# Patient Record
Sex: Male | Born: 1935 | Race: White | Hispanic: No | Smoking: Former smoker
Health system: Southern US, Community
[De-identification: ages and names within clinical notes are randomized; demographics above are authoritative.]

## PROBLEM LIST (undated history)

## (undated) DIAGNOSIS — IMO0002 Reserved for concepts with insufficient information to code with codable children: Secondary | ICD-10-CM

## (undated) DIAGNOSIS — M47817 Spondylosis without myelopathy or radiculopathy, lumbosacral region: Secondary | ICD-10-CM

## (undated) DIAGNOSIS — M542 Cervicalgia: Secondary | ICD-10-CM

## (undated) DIAGNOSIS — I2510 Atherosclerotic heart disease of native coronary artery without angina pectoris: Secondary | ICD-10-CM

## (undated) DIAGNOSIS — G4700 Insomnia, unspecified: Secondary | ICD-10-CM

## (undated) DIAGNOSIS — Z7729 Contact with and (suspected ) exposure to other hazardous substances: Secondary | ICD-10-CM

## (undated) DIAGNOSIS — R0902 Hypoxemia: Secondary | ICD-10-CM

## (undated) DIAGNOSIS — E785 Hyperlipidemia, unspecified: Secondary | ICD-10-CM

## (undated) DIAGNOSIS — M1710 Unilateral primary osteoarthritis, unspecified knee: Secondary | ICD-10-CM

## (undated) DIAGNOSIS — G459 Transient cerebral ischemic attack, unspecified: Secondary | ICD-10-CM

## (undated) DIAGNOSIS — R9430 Abnormal result of cardiovascular function study, unspecified: Secondary | ICD-10-CM

## (undated) DIAGNOSIS — I214 Non-ST elevation (NSTEMI) myocardial infarction: Secondary | ICD-10-CM

## (undated) DIAGNOSIS — K5790 Diverticulosis of intestine, part unspecified, without perforation or abscess without bleeding: Secondary | ICD-10-CM

## (undated) DIAGNOSIS — M545 Low back pain, unspecified: Secondary | ICD-10-CM

## (undated) DIAGNOSIS — I10 Essential (primary) hypertension: Secondary | ICD-10-CM

## (undated) DIAGNOSIS — F411 Generalized anxiety disorder: Secondary | ICD-10-CM

## (undated) DIAGNOSIS — D62 Acute posthemorrhagic anemia: Secondary | ICD-10-CM

## (undated) DIAGNOSIS — M4307 Spondylolysis, lumbosacral region: Secondary | ICD-10-CM

## (undated) DIAGNOSIS — A6000 Herpesviral infection of urogenital system, unspecified: Secondary | ICD-10-CM

## (undated) DIAGNOSIS — C61 Malignant neoplasm of prostate: Principal | ICD-10-CM

## (undated) DIAGNOSIS — K219 Gastro-esophageal reflux disease without esophagitis: Secondary | ICD-10-CM

## (undated) DIAGNOSIS — M1610 Unilateral primary osteoarthritis, unspecified hip: Secondary | ICD-10-CM

## (undated) DIAGNOSIS — J189 Pneumonia, unspecified organism: Secondary | ICD-10-CM

## (undated) DIAGNOSIS — E871 Hypo-osmolality and hyponatremia: Secondary | ICD-10-CM

## (undated) DIAGNOSIS — K5792 Diverticulitis of intestine, part unspecified, without perforation or abscess without bleeding: Secondary | ICD-10-CM

## (undated) DIAGNOSIS — M1990 Unspecified osteoarthritis, unspecified site: Secondary | ICD-10-CM

## (undated) HISTORY — DX: Cervicalgia: M54.2

## (undated) HISTORY — DX: Diverticulosis of intestine, part unspecified, without perforation or abscess without bleeding: K57.90

## (undated) HISTORY — DX: Insomnia, unspecified: G47.00

## (undated) HISTORY — DX: Reserved for concepts with insufficient information to code with codable children: IMO0002

## (undated) HISTORY — DX: Hyperlipidemia, unspecified: E78.5

## (undated) HISTORY — DX: Abnormal result of cardiovascular function study, unspecified: R94.30

## (undated) HISTORY — DX: Herpesviral infection of urogenital system, unspecified: A60.00

## (undated) HISTORY — DX: Contact with and (suspected) exposure to other hazardous substances: Z77.29

## (undated) HISTORY — DX: Acute posthemorrhagic anemia: D62

## (undated) HISTORY — DX: Pneumonia, unspecified organism: J18.9

## (undated) HISTORY — DX: Essential (primary) hypertension: I10

## (undated) HISTORY — DX: Transient cerebral ischemic attack, unspecified: G45.9

## (undated) HISTORY — DX: Spondylolysis, lumbosacral region: M43.07

## (undated) HISTORY — DX: Hypo-osmolality and hyponatremia: E87.1

## (undated) HISTORY — DX: Diverticulitis of intestine, part unspecified, without perforation or abscess without bleeding: K57.92

## (undated) HISTORY — DX: Generalized anxiety disorder: F41.1

## (undated) HISTORY — DX: Unspecified osteoarthritis, unspecified site: M19.90

## (undated) HISTORY — DX: Malignant neoplasm of prostate: C61

## (undated) HISTORY — PX: TONSILLECTOMY: SUR1361

## (undated) HISTORY — DX: Low back pain: M54.5

## (undated) HISTORY — DX: Unilateral primary osteoarthritis, unspecified knee: M17.10

## (undated) HISTORY — DX: Atherosclerotic heart disease of native coronary artery without angina pectoris: I25.10

## (undated) HISTORY — DX: Non-ST elevation (NSTEMI) myocardial infarction: I21.4

## (undated) HISTORY — PX: MASTOIDECTOMY: SHX711

## (undated) HISTORY — DX: Gastro-esophageal reflux disease without esophagitis: K21.9

## (undated) HISTORY — DX: Hypoxemia: R09.02

## (undated) HISTORY — PX: CATARACT EXTRACTION: SUR2

## (undated) HISTORY — DX: Low back pain, unspecified: M54.50

## (undated) HISTORY — DX: Spondylosis without myelopathy or radiculopathy, lumbosacral region: M47.817

## (undated) HISTORY — DX: Unilateral primary osteoarthritis, unspecified hip: M16.10

---

## 2001-11-03 ENCOUNTER — Encounter (INDEPENDENT_AMBULATORY_CARE_PROVIDER_SITE_OTHER): Payer: Self-pay | Admitting: *Deleted

## 2001-11-03 ENCOUNTER — Ambulatory Visit (HOSPITAL_COMMUNITY): Admission: RE | Admit: 2001-11-03 | Discharge: 2001-11-03 | Payer: Self-pay | Admitting: *Deleted

## 2001-12-28 ENCOUNTER — Encounter: Payer: Self-pay | Admitting: Internal Medicine

## 2001-12-28 ENCOUNTER — Encounter: Admission: RE | Admit: 2001-12-28 | Discharge: 2001-12-28 | Payer: Self-pay | Admitting: Internal Medicine

## 2003-09-02 ENCOUNTER — Encounter: Admission: RE | Admit: 2003-09-02 | Discharge: 2003-09-02 | Payer: Self-pay | Admitting: Infectious Diseases

## 2004-01-28 ENCOUNTER — Emergency Department (HOSPITAL_COMMUNITY): Admission: EM | Admit: 2004-01-28 | Discharge: 2004-01-28 | Payer: Self-pay

## 2004-07-27 ENCOUNTER — Encounter: Admission: RE | Admit: 2004-07-27 | Discharge: 2004-07-27 | Payer: Self-pay | Admitting: Specialist

## 2004-10-28 ENCOUNTER — Encounter
Admission: RE | Admit: 2004-10-28 | Discharge: 2004-10-28 | Payer: Self-pay | Admitting: Physical Medicine and Rehabilitation

## 2005-10-26 ENCOUNTER — Emergency Department (HOSPITAL_COMMUNITY): Admission: EM | Admit: 2005-10-26 | Discharge: 2005-10-26 | Payer: Self-pay | Admitting: Emergency Medicine

## 2005-11-04 ENCOUNTER — Encounter: Admission: RE | Admit: 2005-11-04 | Discharge: 2005-11-04 | Payer: Self-pay | Admitting: Internal Medicine

## 2005-12-05 ENCOUNTER — Encounter: Admission: RE | Admit: 2005-12-05 | Discharge: 2005-12-05 | Payer: Self-pay | Admitting: Internal Medicine

## 2006-11-20 ENCOUNTER — Emergency Department (HOSPITAL_COMMUNITY): Admission: EM | Admit: 2006-11-20 | Discharge: 2006-11-20 | Payer: Self-pay | Admitting: Emergency Medicine

## 2009-01-25 ENCOUNTER — Encounter: Admission: RE | Admit: 2009-01-25 | Discharge: 2009-01-25 | Payer: Self-pay | Admitting: Neurological Surgery

## 2010-02-07 ENCOUNTER — Emergency Department (HOSPITAL_COMMUNITY)
Admission: EM | Admit: 2010-02-07 | Discharge: 2010-02-07 | Payer: Self-pay | Source: Home / Self Care | Admitting: Emergency Medicine

## 2010-08-26 DIAGNOSIS — C61 Malignant neoplasm of prostate: Secondary | ICD-10-CM

## 2010-08-26 HISTORY — DX: Malignant neoplasm of prostate: C61

## 2010-09-14 ENCOUNTER — Ambulatory Visit (HOSPITAL_COMMUNITY)
Admission: RE | Admit: 2010-09-14 | Discharge: 2010-09-14 | Payer: Self-pay | Source: Home / Self Care | Attending: Urology | Admitting: Urology

## 2010-09-24 ENCOUNTER — Ambulatory Visit (HOSPITAL_COMMUNITY)
Admission: RE | Admit: 2010-09-24 | Discharge: 2010-09-24 | Payer: Self-pay | Source: Home / Self Care | Attending: Urology | Admitting: Urology

## 2010-09-24 ENCOUNTER — Other Ambulatory Visit: Payer: Self-pay | Admitting: Urology

## 2010-09-24 DIAGNOSIS — C61 Malignant neoplasm of prostate: Secondary | ICD-10-CM

## 2010-09-24 LAB — BUN: BUN: 9 mg/dL (ref 6–23)

## 2010-09-24 LAB — CREATININE, SERUM
GFR calc Af Amer: >60 mL/min (ref >60)
GFR calc non Af Amer: >60 mL/min (ref >60)

## 2010-10-04 ENCOUNTER — Ambulatory Visit (HOSPITAL_COMMUNITY)
Admission: RE | Admit: 2010-10-04 | Discharge: 2010-10-04 | Disposition: A | Discharge status: Home / Self Care | Payer: Medicare Other | Source: Ambulatory Visit | Attending: Urology | Admitting: Urology

## 2010-10-04 ENCOUNTER — Other Ambulatory Visit (HOSPITAL_COMMUNITY): Payer: Self-pay

## 2010-10-04 DIAGNOSIS — M47817 Spondylosis without myelopathy or radiculopathy, lumbosacral region: Secondary | ICD-10-CM | POA: Insufficient documentation

## 2010-10-04 DIAGNOSIS — R928 Other abnormal and inconclusive findings on diagnostic imaging of breast: Secondary | ICD-10-CM | POA: Insufficient documentation

## 2010-10-04 DIAGNOSIS — N400 Enlarged prostate without lower urinary tract symptoms: Secondary | ICD-10-CM | POA: Insufficient documentation

## 2010-10-04 DIAGNOSIS — N323 Diverticulum of bladder: Secondary | ICD-10-CM | POA: Insufficient documentation

## 2010-10-04 DIAGNOSIS — C61 Malignant neoplasm of prostate: Secondary | ICD-10-CM

## 2010-10-04 DIAGNOSIS — C7951 Secondary malignant neoplasm of bone: Secondary | ICD-10-CM | POA: Insufficient documentation

## 2010-10-04 DIAGNOSIS — N62 Hypertrophy of breast: Secondary | ICD-10-CM | POA: Insufficient documentation

## 2010-10-04 MED ORDER — IOHEXOL 300 MG/ML  SOLN: 100.0000 mL | Freq: Once | INTRAMUSCULAR | Status: AC | PRN

## 2010-10-16 ENCOUNTER — Ambulatory Visit (INDEPENDENT_AMBULATORY_CARE_PROVIDER_SITE_OTHER): Payer: Medicare Other | Admitting: Urology

## 2010-10-16 DIAGNOSIS — C61 Malignant neoplasm of prostate: Secondary | ICD-10-CM

## 2010-10-16 DIAGNOSIS — C7952 Secondary malignant neoplasm of bone marrow: Secondary | ICD-10-CM

## 2010-11-23 ENCOUNTER — Emergency Department (HOSPITAL_COMMUNITY): Payer: Medicare Other

## 2010-11-23 ENCOUNTER — Emergency Department (HOSPITAL_COMMUNITY)
Admission: EM | Admit: 2010-11-23 | Discharge: 2010-11-23 | Disposition: A | Discharge status: Home / Self Care | Payer: Medicare Other | Attending: Emergency Medicine | Admitting: Emergency Medicine

## 2010-11-23 DIAGNOSIS — I10 Essential (primary) hypertension: Secondary | ICD-10-CM | POA: Insufficient documentation

## 2010-11-23 DIAGNOSIS — R42 Dizziness and giddiness: Secondary | ICD-10-CM | POA: Insufficient documentation

## 2010-11-23 DIAGNOSIS — Z79899 Other long term (current) drug therapy: Secondary | ICD-10-CM | POA: Insufficient documentation

## 2010-11-23 LAB — BASIC METABOLIC PANEL
BUN: 22 mg/dL (ref 6–23)
CO2: 32 mEq/L (ref 19–32)
Chloride: 101 mEq/L (ref 96–112)
Creatinine, Ser: 1.13 mg/dL (ref 0.4–1.5)
Glucose, Bld: 98 mg/dL (ref 70–99)
Potassium: 4.7 mEq/L (ref 3.5–5.1)

## 2010-11-23 LAB — DIFFERENTIAL
Basophils Relative: 1 % (ref 0–1)
Eosinophils Absolute: 0.3 10*3/uL (ref 0.0–0.7)
Eosinophils Relative: 6 % — ABNORMAL HIGH (ref 0–5)
Lymphs Abs: 1.8 10*3/uL (ref 0.7–4.0)
Monocytes Relative: 11 % (ref 3–12)
Neutrophils Relative %: 47 % (ref 43–77)

## 2010-11-23 LAB — URINALYSIS, ROUTINE W REFLEX MICROSCOPIC
Bilirubin Urine: NEGATIVE
Glucose, UA: NEGATIVE mg/dL
Hgb urine dipstick: NEGATIVE
Ketones, ur: NEGATIVE mg/dL
Protein, ur: NEGATIVE mg/dL
pH: 7 (ref 5.0–8.0)

## 2010-11-23 LAB — CBC
MCH: 36.1 pg — ABNORMAL HIGH (ref 26.0–34.0)
MCV: 99.1 fL (ref 78.0–100.0)
Platelets: 159 10*3/uL (ref 150–400)
RBC: 4.32 MIL/uL (ref 4.22–5.81)
RDW: 12.1 % (ref 11.5–15.5)
WBC: 4.9 10*3/uL (ref 4.0–10.5)

## 2010-11-23 LAB — POCT CARDIAC MARKERS

## 2010-11-24 LAB — URINE CULTURE
Colony Count: NO GROWTH
Culture  Setup Time: 201203301041

## 2010-12-23 ENCOUNTER — Emergency Department (HOSPITAL_COMMUNITY)
Admission: EM | Admit: 2010-12-23 | Discharge: 2010-12-23 | Disposition: A | Discharge status: Home / Self Care | Payer: Medicare Other | Attending: Emergency Medicine | Admitting: Emergency Medicine

## 2010-12-23 DIAGNOSIS — I10 Essential (primary) hypertension: Secondary | ICD-10-CM | POA: Insufficient documentation

## 2010-12-23 DIAGNOSIS — K5732 Diverticulitis of large intestine without perforation or abscess without bleeding: Secondary | ICD-10-CM | POA: Insufficient documentation

## 2010-12-23 DIAGNOSIS — R1032 Left lower quadrant pain: Secondary | ICD-10-CM | POA: Insufficient documentation

## 2010-12-23 LAB — COMPREHENSIVE METABOLIC PANEL
ALT: 17 U/L (ref 0–53)
AST: 25 U/L (ref 0–37)
Alkaline Phosphatase: 52 U/L (ref 39–117)
CO2: 27 mEq/L (ref 19–32)
Calcium: 9.4 mg/dL (ref 8.4–10.5)
Chloride: 101 mEq/L (ref 96–112)
GFR calc non Af Amer: >60 mL/min (ref >60)
Glucose, Bld: 99 mg/dL (ref 70–99)
Sodium: 135 mEq/L (ref 135–145)
Total Bilirubin: 0.8 mg/dL (ref 0.3–1.2)

## 2010-12-23 LAB — URINALYSIS, ROUTINE W REFLEX MICROSCOPIC
Bilirubin Urine: NEGATIVE
Glucose, UA: NEGATIVE mg/dL
Hgb urine dipstick: NEGATIVE
Specific Gravity, Urine: 1.02 (ref 1.005–1.030)
Urobilinogen, UA: 0.2 mg/dL (ref 0.0–1.0)

## 2010-12-23 LAB — DIFFERENTIAL
Eosinophils Relative: 2 % (ref 0–5)
Lymphocytes Relative: 17 % (ref 12–46)
Lymphs Abs: 1.6 10*3/uL (ref 0.7–4.0)
Monocytes Absolute: 1.2 10*3/uL — ABNORMAL HIGH (ref 0.1–1.0)

## 2010-12-23 LAB — CBC
HCT: 41.5 % (ref 39.0–52.0)
Hemoglobin: 15.1 g/dL (ref 13.0–17.0)
MCHC: 36.4 g/dL — ABNORMAL HIGH (ref 30.0–36.0)
MCV: 98.3 fL (ref 78.0–100.0)
RDW: 12.4 % (ref 11.5–15.5)
WBC: 9.7 10*3/uL (ref 4.0–10.5)

## 2011-07-26 ENCOUNTER — Other Ambulatory Visit: Payer: Self-pay | Admitting: Urology

## 2011-07-26 ENCOUNTER — Ambulatory Visit (HOSPITAL_COMMUNITY)
Admission: RE | Admit: 2011-07-26 | Discharge: 2011-07-26 | Disposition: A | Discharge status: Home / Self Care | Payer: Medicare Other | Source: Ambulatory Visit | Attending: Urology | Admitting: Urology

## 2011-07-26 DIAGNOSIS — M545 Low back pain, unspecified: Secondary | ICD-10-CM | POA: Insufficient documentation

## 2011-07-26 DIAGNOSIS — C7952 Secondary malignant neoplasm of bone marrow: Secondary | ICD-10-CM | POA: Insufficient documentation

## 2011-07-26 DIAGNOSIS — J90 Pleural effusion, not elsewhere classified: Secondary | ICD-10-CM | POA: Insufficient documentation

## 2011-07-26 DIAGNOSIS — G9520 Unspecified cord compression: Secondary | ICD-10-CM

## 2011-07-26 DIAGNOSIS — C7951 Secondary malignant neoplasm of bone: Secondary | ICD-10-CM | POA: Insufficient documentation

## 2011-07-26 DIAGNOSIS — C61 Malignant neoplasm of prostate: Secondary | ICD-10-CM | POA: Insufficient documentation

## 2011-07-26 DIAGNOSIS — M4804 Spinal stenosis, thoracic region: Secondary | ICD-10-CM | POA: Insufficient documentation

## 2011-07-26 DIAGNOSIS — R109 Unspecified abdominal pain: Secondary | ICD-10-CM | POA: Insufficient documentation

## 2011-07-26 MED ORDER — GADOBENATE DIMEGLUMINE 529 MG/ML IV SOLN
15.0000 mL | Freq: Once | INTRAVENOUS | Status: AC | PRN
  Administered 2011-07-26: 15 mL via INTRAVENOUS

## 2011-07-29 LAB — POCT I-STAT, CHEM 8
BUN: 18 mg/dL (ref 6–23)
Calcium, Ion: 1.16 mmol/L (ref 1.12–1.32)
Creatinine, Ser: 1.1 mg/dL (ref 0.50–1.35)
Glucose, Bld: 96 mg/dL (ref 70–99)
Hemoglobin: 14.6 g/dL (ref 13.0–17.0)
TCO2: 30 mmol/L (ref 0–100)

## 2011-08-10 ENCOUNTER — Telehealth: Payer: Self-pay | Admitting: *Deleted

## 2011-08-10 NOTE — Telephone Encounter (Signed)
left voice message to inform the patient of the new date and time on 10-23-2011 

## 2011-08-12 ENCOUNTER — Telehealth: Payer: Self-pay | Admitting: *Deleted

## 2011-08-12 NOTE — Telephone Encounter (Signed)
Patient called in and confirmed appointment for 08-29-2011 starting at 1:30pm with the fcounseling and lab and md

## 2011-08-13 ENCOUNTER — Telehealth: Payer: Self-pay | Admitting: Oncology

## 2011-08-13 NOTE — Telephone Encounter (Signed)
Referred by Dr. Retta Diones Dx-Met Prostate

## 2011-08-16 ENCOUNTER — Other Ambulatory Visit: Payer: Self-pay | Admitting: Oncology

## 2011-08-16 DIAGNOSIS — C61 Malignant neoplasm of prostate: Secondary | ICD-10-CM

## 2011-08-23 ENCOUNTER — Encounter: Payer: Self-pay | Admitting: *Deleted

## 2011-08-29 ENCOUNTER — Ambulatory Visit (HOSPITAL_BASED_OUTPATIENT_CLINIC_OR_DEPARTMENT_OTHER): Payer: Medicare Other | Admitting: Oncology

## 2011-08-29 ENCOUNTER — Other Ambulatory Visit (HOSPITAL_BASED_OUTPATIENT_CLINIC_OR_DEPARTMENT_OTHER): Payer: Medicare Other | Admitting: Lab

## 2011-08-29 ENCOUNTER — Ambulatory Visit: Payer: Medicare Other

## 2011-08-29 VITALS — BP 128/70 | HR 64 | Temp 98.2°F | Ht 68.0 in | Wt 165.5 lb

## 2011-08-29 DIAGNOSIS — C61 Malignant neoplasm of prostate: Secondary | ICD-10-CM

## 2011-08-29 DIAGNOSIS — M899 Disorder of bone, unspecified: Secondary | ICD-10-CM

## 2011-08-29 DIAGNOSIS — C7951 Secondary malignant neoplasm of bone: Secondary | ICD-10-CM

## 2011-08-29 LAB — COMPREHENSIVE METABOLIC PANEL
AST: 21 U/L (ref 0–37)
Albumin: 4.3 g/dL (ref 3.5–5.2)
Alkaline Phosphatase: 68 U/L (ref 39–117)
BUN: 16 mg/dL (ref 6–23)
Glucose, Bld: 94 mg/dL (ref 70–99)
Potassium: 4.2 mEq/L (ref 3.5–5.3)
Sodium: 139 mEq/L (ref 135–145)
Total Bilirubin: 0.5 mg/dL (ref 0.3–1.2)
Total Protein: 6.3 g/dL (ref 6.0–8.3)

## 2011-08-29 LAB — TESTOSTERONE: Testosterone: 31.11 ng/dL — ABNORMAL LOW (ref 250–890)

## 2011-08-29 LAB — CBC WITH DIFFERENTIAL/PLATELET
EOS%: 2.3 % (ref 0.0–7.0)
Eosinophils Absolute: 0.2 10*3/uL (ref 0.0–0.5)
LYMPH%: 24.9 % (ref 14.0–49.0)
MCH: 34.3 pg — ABNORMAL HIGH (ref 27.2–33.4)
MCV: 99.8 fL — ABNORMAL HIGH (ref 79.3–98.0)
MONO%: 9.2 % (ref 0.0–14.0)
Platelets: 284 10*3/uL (ref 140–400)
RBC: 4.03 10*6/uL — ABNORMAL LOW (ref 4.20–5.82)
RDW: 13.6 % (ref 11.0–14.6)

## 2011-08-29 NOTE — Progress Notes (Signed)
Note Dictated

## 2011-08-29 NOTE — Progress Notes (Signed)
CC:   Craig [name], M.D. Craig [name], M.D.  REASON FOR CONSULTATION:  Prostate cancer.  HISTORY OF PRESENT ILLNESS:  Craig [name] presents today for a followup visit.  He is a pleasant 78 year old gentleman, native of [place], lived in [place] for an extended period of time during his profession in the [employer].  He also was in the [employer] during the later part of the [place] War.  This is a gentleman with a past medical history significant for osteoarthritis, hyperlipidemia and was diagnosed with prostate cancer dating back to January 2012.  He presented with an elevated, asymptomatic PSA of 5.27, and underwent an ultrasound biopsy on September 03, 2010.  12 out of 12 biopsies involved prostate cancer with Gleason score predominantly 4 + 5 = 9.  He had a clinical staging of T2c at that time.  He subsequently had a staging workup and showed positive for bony metastasis in his ribs, the 7th or 8th rib.  He did not have any evidence of any adenopathies at that time.  The patient is under the care of Dr. Retta Diones, treated with Deborra Medina on 10/22/2010 and subsequently Lupron on 11/27/2010 and has been on it since that time. He was also started on Xgeva in June 2012.  He had an excellent response.  The PSA had dropped from 5.4 to a nadir of 0.3 and thinks his most recent PSA was around 0.4.  The patient has also had a rather significant history of back pain, again diagnosed with osteoarthritis in the past, and had been seen by Dr. 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  The patient, again, was referred to me for evaluation regarding his prostate cancer.  Clinically, Craig [name] feels relatively fair.  He, again as mentioned, does have intermittent back pain, but again, this really  seems to be in quality no different than his osteoarthritic pain.  He does not report any neurological symptoms, does not report any weakness of his lower extremities.  He does not really report any other deterioration or increase in the quality of his pain.  He takes Aleve which seems to be managing his pain quite well at this time.  He is able to perform most activities of daily living without any hindrance or decline.  He is able to drive, is able to walk around and perform, as mentioned, most of his house related duties.  REVIEW OF SYSTEMS:  He does not report any headaches, blurry vision, double vision.  Does not report any motor or sensory neuropathy.  Does not report any alteration in mental status.  Does not report any psychiatric issues, depression.  Does not report any fever, chills, sweats.  Does not report any cough, hemoptysis, hematemesis.  No nausea or vomiting.  No abdominal pain.  No hematochezia, no melena.  No genitourinary complaints.  No frequency, urgency or hesitancy.  No musculoskeletal complaints.  No arthralgias or myalgias.  No heat or cold intolerance.  No bleeding or clotting diathesis.  The rest of the review of systems is unremarkable.  PAST MEDICAL HISTORY:  Significant for arthritis, history of hyperlipidemia, hypertension, herpes simplex.  MEDICATIONS:  He is on acyclovir, amlodipine, aspirin, calcium, glucosamine, hydrochlorothiazide, ibuprofen, lisinopril, he is on Lupron, nifedipine, and as mentioned, Xgeva.  ALLERGIES:  To penicillin.  FAMILY HISTORY:  Really unremarkable for any prostate cancer.  There is a remote history of kidney cancer and nephrolithiasis.  SOCIAL HISTORY:  He is divorced.  He has 2 children.  He smoked about 2 packs per day but currently not actively smoking.  Denied any alcohol abuse.  He does drink occasionally, however.  PHYSICAL EXAMINATION:  General:  Alert, awake gentleman, appeared in no active distress today.   Vitals:  Showed a blood pressure of 128/70, pulse 64, respirations 20, temperature is 98.2.  HEENT:  Head is normocephalic, atraumatic.  Pupils equal, round and reactive to light. Oral mucosa moist and pink.  Neck:  Supple, no lymphadenopathy.  Heart: Regular rate and rhythm, S1 and S2.  Lungs:  Clear to auscultation without rhonchi, wheezes or dullness to percussion.  Abdomen:  Soft, nontender, no hepatosplenomegaly.  Extremities:  No clubbing, cyanosis, or edema.  Neurologic:  Intact motor, sensory and deep tendon reflexes.  LABORATORY DATA:  Showed a hemoglobin of 13.8, white cell count 7.9, platelet count of 284.  Testosterone and PSA level are currently pending.  ASSESSMENT AND PLAN: 1. This is an 78 year old gentleman with prostate cancer.  His initial     diagnosis in January 2012 revealed a prostate cancer, Gleason score     4 + 5 = 9, PSA 5.47, clinical staging T2c.  He also had presented     with advanced disease with bony metastasis.  I had a lengthy     discussion with Craig [name] discussing the natural course of     prostate cancer and especially advanced prostate cancer that is     hormone sensitive.  I feel for the time being he is really on     appropriate treatment with hormonal deprivation as well as bony     protective agent such as Xgeva.  However, I feel that we want to     make sure that his testosterone is adequately castrate; for that I     am checking a testosterone level at this time.  I also feel that he     is really at a high risk of developing castration-resistant     disease, if he is not all the way on the way there already.  Given     his high Gleason score, he certainly would be a candidate for     developing castration-resistant disease, which would really put him     at a different category as well as him possibly needing different     treatment agents.  That would be in the form of second-line     hormonal manipulation by glutamate or ketoconazole,  immunotherapy     such as Provenge, or systemic chemotherapy such as Taxotere,     Jevtana, or even clinical trials.  At this point, I feel that Mr.     [name] does not need those at this time but certainly in the near     future. 2. Pain.  His, again, bone pain is predominantly arthritic in nature     from what I can tell, but certainly he would be at consideration     for developing bony pain, and I have talked to him about the role     of radiation therapy should he develop worsening pain at any time. All his questions were answered today.  Followup will be in about 3 months' time, sooner if felt needed.    ______________________________ Benjiman Core, M.D. FNS/MEDQ  D:  08/29/2011  T:  08/29/2011  Job:  [id number]

## 2011-11-27 ENCOUNTER — Telehealth: Payer: Self-pay | Admitting: Oncology

## 2011-11-27 ENCOUNTER — Other Ambulatory Visit (HOSPITAL_BASED_OUTPATIENT_CLINIC_OR_DEPARTMENT_OTHER): Payer: Medicare Other

## 2011-11-27 ENCOUNTER — Ambulatory Visit (HOSPITAL_BASED_OUTPATIENT_CLINIC_OR_DEPARTMENT_OTHER): Payer: Medicare Other | Admitting: Oncology

## 2011-11-27 VITALS — BP 143/72 | HR 58 | Temp 97.3°F | Ht 69.0 in | Wt 166.8 lb

## 2011-11-27 DIAGNOSIS — M899 Disorder of bone, unspecified: Secondary | ICD-10-CM

## 2011-11-27 DIAGNOSIS — Z79818 Long term (current) use of other agents affecting estrogen receptors and estrogen levels: Secondary | ICD-10-CM

## 2011-11-27 DIAGNOSIS — M949 Disorder of cartilage, unspecified: Secondary | ICD-10-CM

## 2011-11-27 DIAGNOSIS — C61 Malignant neoplasm of prostate: Secondary | ICD-10-CM

## 2011-11-27 LAB — PSA: PSA: 0.32 ng/mL (ref <=4.00)

## 2011-11-27 LAB — CBC WITH DIFFERENTIAL/PLATELET
Basophils Absolute: 0 10*3/uL (ref 0.0–0.1)
HCT: 41 % (ref 38.4–49.9)
HGB: 14.6 g/dL (ref 13.0–17.1)
LYMPH%: 28.1 % (ref 14.0–49.0)
MONO#: 0.6 10*3/uL (ref 0.1–0.9)
NEUT%: 59.7 % (ref 39.0–75.0)
Platelets: 205 10*3/uL (ref 140–400)
WBC: 7.6 10*3/uL (ref 4.0–10.3)
lymph#: 2.1 10*3/uL (ref 0.9–3.3)

## 2011-11-27 LAB — COMPREHENSIVE METABOLIC PANEL
AST: 25 U/L (ref 0–37)
Albumin: 4.6 g/dL (ref 3.5–5.2)
BUN: 22 mg/dL (ref 6–23)
CO2: 26 mEq/L (ref 19–32)
Calcium: 9.7 mg/dL (ref 8.4–10.5)
Chloride: 102 mEq/L (ref 96–112)
Creatinine, Ser: 1.01 mg/dL (ref 0.50–1.35)
Glucose, Bld: 90 mg/dL (ref 70–99)
Potassium: 3.9 mEq/L (ref 3.5–5.3)

## 2011-11-27 LAB — TESTOSTERONE: Testosterone: 27.3 ng/dL — ABNORMAL LOW (ref 300–890)

## 2011-11-27 NOTE — Telephone Encounter (Signed)
lmonvm for pt re appt for 6/18 and mailed schedule.

## 2011-11-27 NOTE — Progress Notes (Signed)
Hematology and Oncology Follow Up Visit  Craig [name] [id number] 18-Jan-1926 78 y.o. 11/27/2011 10:43 AM   Principle Diagnosis: 78 year old gentleman with prostate cancer diagnosed in January of 2012. It a Gleason score 4+5 = 9.  Clinical stage TIIC. He has advanced disease to the bone   Current therapy: He has been on [place] since  10/22/2010 and subsequently Lupron on 11/27/2010 and has been on it since that time. He started [place] in June of 2012  Interim History:  Craig [name] presents today for a followup visit. He is a 78 year old gentleman who I saw back in January 2013 for advanced prostate cancer and he is currently treated with hormonal therapy as well as bone directed therapy. His most recent PSA in January 2013 was at 0.4 with a testosterone level above 31. Since the last time I saw him Craig [name] is asymptomatic he does report some diffuse arthritic pain which has not really changed. He had not had any changes in his performance status her activity level he had not had any changes in his quality of life. Is not reporting any constitutional symptoms overall he is doing well. He does report knee pain and as mentioned most likely arthritic in nature.  Medications: I have reviewed the patient's current medications. Current outpatient prescriptions:acyclovir (ZOVIRAX) 200 MG capsule, , Disp: , Rfl: ;  amLODipine (NORVASC) 5 MG tablet, , Disp: , Rfl: ;  aspirin 81 MG tablet, Take 81 mg by mouth daily. Take 1/2 tablet daily , Disp: , Rfl: ;  fenoprofen (NALFON) 600 MG TABS, Take 600 mg by mouth daily. With Vitamin D , Disp: , Rfl: ;  Glucosamine-Chondroit-Vit C-Mn (GLUCOSAMINE CHONDR 1500 COMPLX PO), Take 1,500 mg by mouth daily.  , Disp: , Rfl:  hydrochlorothiazide (HYDRODIURIL) 50 MG tablet, , Disp: , Rfl: ;  lisinopril (PRINIVIL,ZESTRIL) 40 MG tablet, , Disp: , Rfl: ;  Red Yeast Rice 600 MG CAPS, Take by mouth. 2 tablets morning and 2 night , Disp: , Rfl:   Allergies:  Allergies  Allergen  Reactions  . Penicillins     Past Medical History, Surgical history, Social history, and Family History were reviewed and updated.  Review of Systems: Constitutional:  Negative for fever, chills, night sweats, anorexia, weight loss, pain. Cardiovascular: no chest pain or dyspnea on exertion Respiratory: no cough, shortness of breath, or wheezing Neurological: no TIA or stroke symptoms Dermatological: negative ENT: negative Skin: Negative. Gastrointestinal: negative Genito-Urinary: negative Hematological and Lymphatic: negative Breast: negative Musculoskeletal: negative Remaining ROS negative. Physical Exam: Blood pressure 143/72, pulse 58, temperature 97.3 F (36.3 C), temperature source Oral, height 5\' 9"  (1.753 m), weight 166 lb 12.8 oz (75.66 kg). ECOG: 1 General appearance: alert Head: Normocephalic, without obvious abnormality, atraumatic Neck: no adenopathy, no carotid bruit, no JVD, supple, symmetrical, trachea midline and thyroid not enlarged, symmetric, no tenderness/mass/nodules Lymph nodes: Cervical, supraclavicular, and axillary nodes normal. Heart:regular rate and rhythm, S1, S2 normal, no murmur, click, rub or gallop Lung:chest clear, no wheezing, rales, normal symmetric air entry, Heart exam - S1, S2 normal, no murmur, no gallop, rate regular Abdomin: soft, non-tender, without masses or organomegaly EXT:no erythema, induration, or nodules   Lab Results: Lab Results  Component Value Date   WBC 7.6 11/27/2011   HGB 14.6 11/27/2011   HCT 41.0 11/27/2011   MCV 95.1 11/27/2011   PLT 205 11/27/2011     Chemistry      Component Value Date/Time   NA 139 08/29/2011 1339   K  4.2 08/29/2011 1339   CL 99 08/29/2011 1339   CO2 31 08/29/2011 1339   BUN 16 08/29/2011 1339   CREATININE 0.93 08/29/2011 1339      Component Value Date/Time   CALCIUM 9.6 08/29/2011 1339   ALKPHOS 68 08/29/2011 1339   AST 21 08/29/2011 1339   ALT 16 08/29/2011 1339   BILITOT 0.5 08/29/2011 1339        Impression and Plan:  78 year old gentleman with the following issues:  1. Advanced prostate cancer continue to be hormone sensitive at this time with an excellent PSA response to Lupron. Most recent PSA is down to 0.4 with nearly castrate testosterone level. The time being I recommend keeping him on the same medication. I would likely recommend satellite hormonal manipulation as his PSA start to go up again. I will see him in followup in about 2-3 months  2. Bony disease: He is currently on Xgeva. Doing well with that at the time being given at Craig [name]  3. Bony pain most likely arthritic in nature continue to be stable at this time.  [facility], MD 4/3/201310:43 AM

## 2012-02-11 ENCOUNTER — Other Ambulatory Visit (HOSPITAL_BASED_OUTPATIENT_CLINIC_OR_DEPARTMENT_OTHER): Payer: Medicare Other | Admitting: Lab

## 2012-02-11 ENCOUNTER — Telehealth: Payer: Self-pay | Admitting: Oncology

## 2012-02-11 ENCOUNTER — Ambulatory Visit (HOSPITAL_BASED_OUTPATIENT_CLINIC_OR_DEPARTMENT_OTHER): Payer: Medicare Other | Admitting: Oncology

## 2012-02-11 VITALS — BP 147/84 | HR 63 | Temp 97.3°F | Ht 69.0 in | Wt 165.7 lb

## 2012-02-11 DIAGNOSIS — C61 Malignant neoplasm of prostate: Secondary | ICD-10-CM

## 2012-02-11 DIAGNOSIS — M949 Disorder of cartilage, unspecified: Secondary | ICD-10-CM

## 2012-02-11 LAB — CBC WITH DIFFERENTIAL/PLATELET
BASO%: 0.6 % (ref 0.0–2.0)
LYMPH%: 34.5 % (ref 14.0–49.0)
MCHC: 34 g/dL (ref 32.0–36.0)
MONO#: 0.6 10*3/uL (ref 0.1–0.9)
Platelets: 236 10*3/uL (ref 140–400)
RBC: 4.03 10*6/uL — ABNORMAL LOW (ref 4.20–5.82)
RDW: 13.5 % (ref 11.0–14.6)
WBC: 6.7 10*3/uL (ref 4.0–10.3)

## 2012-02-11 LAB — COMPREHENSIVE METABOLIC PANEL
ALT: 15 U/L (ref 0–53)
Albumin: 4 g/dL (ref 3.5–5.2)
Alkaline Phosphatase: 43 U/L (ref 39–117)
CO2: 31 mEq/L (ref 19–32)
Potassium: 3.4 mEq/L — ABNORMAL LOW (ref 3.5–5.3)
Sodium: 142 mEq/L (ref 135–145)
Total Bilirubin: 0.5 mg/dL (ref 0.3–1.2)
Total Protein: 6.5 g/dL (ref 6.0–8.3)

## 2012-02-11 LAB — PSA: PSA: 0.33 ng/mL (ref <=4.00)

## 2012-02-11 NOTE — Telephone Encounter (Signed)
appts made and printed for pt aom °

## 2012-02-11 NOTE — Progress Notes (Signed)
Hematology and Oncology Follow Up Visit  Craig [name] [id number] 02-Apr-1926 78 y.o. 02/11/2012 3:41 PM   Principle Diagnosis: 78 year old gentleman with prostate cancer diagnosed in January of 2012. It a Gleason score 4+5 = 9.  Clinical stage TIIC. He has advanced disease to the bone   Current therapy: He has been on [place] since  10/22/2010 and subsequently Lupron on 11/27/2010 and has been on it since that time. He started [place] in June of 2012  Interim History:  Craig [name] presents today for a followup visit. He is a 78 year old gentleman who I saw back in January 2013 for advanced prostate cancer and he is currently treated with hormonal therapy as well as bone directed therapy. His most recent PSA in April 2013 was at 0.32. Since the last time I saw him Craig [name] is asymptomatic he does report some diffuse arthritic pain which has not really changed. He had not had any changes in his performance status her activity level he had not had any changes in his quality of life. Is not reporting any constitutional symptoms overall he is doing well. He does report knee pain and as mentioned most likely arthritic in nature.  Medications: I have reviewed the patient's current medications. Current outpatient prescriptions:acyclovir (ZOVIRAX) 200 MG capsule, , Disp: , Rfl: ;  amLODipine (NORVASC) 5 MG tablet, Take 2.5 mg by mouth daily. , Disp: , Rfl: ;  aspirin 81 MG tablet, Take 81 mg by mouth daily. Take 1/2 tablet daily , Disp: , Rfl: ;  fenoprofen (NALFON) 600 MG TABS, Take 600 mg by mouth daily. With Vitamin D , Disp: , Rfl: ;  fish oil-omega-3 fatty acids 1000 MG capsule, Take 1 g by mouth daily., Disp: , Rfl:  hydrochlorothiazide (HYDRODIURIL) 50 MG tablet, , Disp: , Rfl: ;  lisinopril (PRINIVIL,ZESTRIL) 40 MG tablet, , Disp: , Rfl:   Allergies:  Allergies  Allergen Reactions  . Penicillins     Past Medical History, Surgical history, Social history, and Family History were reviewed and  updated.  Review of Systems: Constitutional:  Negative for fever, chills, night sweats, anorexia, weight loss, pain. Cardiovascular: no chest pain or dyspnea on exertion Respiratory: no cough, shortness of breath, or wheezing Neurological: no TIA or stroke symptoms Dermatological: negative ENT: negative Skin: Negative. Gastrointestinal: negative Genito-Urinary: negative Hematological and Lymphatic: negative Breast: negative Musculoskeletal: negative Remaining ROS negative. Physical Exam: Blood pressure 147/84, pulse 63, temperature 97.3 F (36.3 C), temperature source Oral, height 5\' 9"  (1.753 m), weight 165 lb 11.2 oz (75.161 kg). ECOG: 1 General appearance: alert Head: Normocephalic, without obvious abnormality, atraumatic Neck: no adenopathy, no carotid bruit, no JVD, supple, symmetrical, trachea midline and thyroid not enlarged, symmetric, no tenderness/mass/nodules Lymph nodes: Cervical, supraclavicular, and axillary nodes normal. Heart:regular rate and rhythm, S1, S2 normal, no murmur, click, rub or gallop Lung:chest clear, no wheezing, rales, normal symmetric air entry, Heart exam - S1, S2 normal, no murmur, no gallop, rate regular Abdomin: soft, non-tender, without masses or organomegaly EXT:no erythema, induration, or nodules   Lab Results: Lab Results  Component Value Date   WBC 6.7 02/11/2012   HGB 14.1 02/11/2012   HCT 41.4 02/11/2012   MCV 102.7* 02/11/2012   PLT 236 02/11/2012     Chemistry      Component Value Date/Time   NA 140 11/27/2011 0958   K 3.9 11/27/2011 0958   CL 102 11/27/2011 0958   CO2 26 11/27/2011 0958   BUN 22 11/27/2011 0958  CREATININE 1.01 11/27/2011 0958      Component Value Date/Time   CALCIUM 9.7 11/27/2011 0958   ALKPHOS 44 11/27/2011 0958   AST 25 11/27/2011 0958   ALT 18 11/27/2011 0958   BILITOT 0.5 11/27/2011 0958     Results for Craig [name], Craig [name] (MRN [id number]) as of 02/11/2012 15:42  Ref. Range 08/29/2011 13:39 11/27/2011 09:58  PSA Latest  Range: <=4.00 ng/mL 0.40 0.32    Impression and Plan:  78 year old gentleman with the following issues:  1. Advanced prostate cancer continue to be hormone sensitive at this time with an excellent PSA response to Lupron. Most recent PSA is down to 0.32 with nearly castrate testosterone level. The time being I recommend keeping him on the same medication. I would likely recommend second line hormonal manipulation if his PSA start to go up again. I will see him in follow every about 2 months  2. Bony disease: He is currently on Xgeva. Doing well with that at the time being given at Dr. Retta Diones  3. Bony pain most likely arthritic in nature continue to be stable at this time.  [facility], MD 6/18/20133:41 PM

## 2012-04-14 ENCOUNTER — Encounter: Payer: Self-pay | Admitting: Oncology

## 2012-04-14 ENCOUNTER — Telehealth: Payer: Self-pay | Admitting: Oncology

## 2012-04-14 ENCOUNTER — Ambulatory Visit (HOSPITAL_BASED_OUTPATIENT_CLINIC_OR_DEPARTMENT_OTHER): Payer: Medicare Other | Admitting: Oncology

## 2012-04-14 ENCOUNTER — Other Ambulatory Visit (HOSPITAL_BASED_OUTPATIENT_CLINIC_OR_DEPARTMENT_OTHER): Payer: Medicare Other | Admitting: Lab

## 2012-04-14 VITALS — BP 162/83 | HR 58 | Temp 97.8°F | Resp 20 | Ht 69.0 in | Wt 166.4 lb

## 2012-04-14 DIAGNOSIS — C7951 Secondary malignant neoplasm of bone: Secondary | ICD-10-CM

## 2012-04-14 DIAGNOSIS — C61 Malignant neoplasm of prostate: Secondary | ICD-10-CM

## 2012-04-14 LAB — CBC WITH DIFFERENTIAL/PLATELET
Basophils Absolute: 0 10*3/uL (ref 0.0–0.1)
Eosinophils Absolute: 0.3 10*3/uL (ref 0.0–0.5)
HCT: 41.5 % (ref 38.4–49.9)
HGB: 14.6 g/dL (ref 13.0–17.1)
LYMPH%: 32.7 % (ref 14.0–49.0)
MCV: 102.6 fL — ABNORMAL HIGH (ref 79.3–98.0)
MONO%: 10.2 % (ref 0.0–14.0)
NEUT#: 3 10*3/uL (ref 1.5–6.5)
NEUT%: 50.8 % (ref 39.0–75.0)
Platelets: 197 10*3/uL (ref 140–400)
RDW: 13.6 % (ref 11.0–14.6)

## 2012-04-14 LAB — COMPREHENSIVE METABOLIC PANEL
Albumin: 4.2 g/dL (ref 3.5–5.2)
Alkaline Phosphatase: 36 U/L — ABNORMAL LOW (ref 39–117)
BUN: 18 mg/dL (ref 6–23)
Glucose, Bld: 92 mg/dL (ref 70–99)
Potassium: 3.8 mEq/L (ref 3.5–5.3)

## 2012-04-14 NOTE — Telephone Encounter (Signed)
Gave pt appt for October 2013 lab and MD 

## 2012-04-14 NOTE — Patient Instructions (Addendum)
Your PSA was 0.33 in June 2013. It has been drawn again today and will be back tomorrow. If you do not receive a call from [place] in the next 1-2 days, then feel free to call us for your PSA result.   Continue to follow up with [facility] for your [place] and Lupron.  Follow-up with [place] in 2 months.

## 2012-04-14 NOTE — Progress Notes (Signed)
Hematology and Oncology Follow Up Visit  Craig [name] [id number] [birth year] 78 y.o. 04/14/2012 10:32 AM   Principle Diagnosis: 78 year old gentleman with prostate cancer diagnosed in January of 2012. It a Gleason score 4+5 = 9.  Clinical stage TIIC. He has advanced disease to the bone   Current therapy: He has been on [place] since  10/22/2010 and subsequently Lupron on 11/27/2010 and has been on it since that time. He started [place] in June of 2012  Interim History:  Craig [name] presents today for a followup visit. He is a 78 year old gentleman with advanced prostate cancer. He is currently treated with hormonal therapy as well as bone directed therapy at [facility]. His most recent PSA in April 2013 was at 0.33. Since the last time I saw him Craig [name] is asymptomatic he does report some diffuse arthritic pain. He is having more pain to his knee and will start rehab for this later today. He had not had any changes in his performance status her activity level he had not had any changes in his quality of life. Is not reporting any constitutional symptoms overall he is doing well.   Medications: I have reviewed the patient's current medications. Current outpatient prescriptions:acyclovir (ZOVIRAX) 200 MG capsule, , Disp: , Rfl: ;  amLODipine (NORVASC) 5 MG tablet, Take 2.5 mg by mouth daily. , Disp: , Rfl: ;  aspirin 81 MG tablet, Take 81 mg by mouth daily. Take 1/2 tablet daily , Disp: , Rfl: ;  fenoprofen (NALFON) 600 MG TABS, Take 600 mg by mouth daily. With Vitamin D , Disp: , Rfl: ;  fish oil-omega-3 fatty acids 1000 MG capsule, Take 1 g by mouth daily., Disp: , Rfl:  hydrochlorothiazide (HYDRODIURIL) 50 MG tablet, , Disp: , Rfl: ;  lisinopril (PRINIVIL,ZESTRIL) 40 MG tablet, , Disp: , Rfl:   Allergies:  Allergies  Allergen Reactions  . Penicillins     Past Medical History, Surgical history, Social history, and Family History were reviewed and updated.  Review of  Systems: Constitutional:  Negative for fever, chills, night sweats, anorexia, weight loss, pain. Cardiovascular: no chest pain or dyspnea on exertion Respiratory: no cough, shortness of breath, or wheezing Neurological: no TIA or stroke symptoms Dermatological: negative ENT: negative Skin: Negative. Gastrointestinal: negative Genito-Urinary: negative Hematological and Lymphatic: negative Breast: negative Musculoskeletal: negative Remaining ROS negative.  Physical Exam: Blood pressure 162/83, pulse 58, temperature 97.8 F (36.6 C), temperature source Oral, resp. rate 20, height 5\' 9"  (1.753 m), weight 166 lb 6.4 oz (75.479 kg). ECOG: 1 General appearance: alert Head: Normocephalic, without obvious abnormality, atraumatic Neck: no adenopathy, no carotid bruit, no JVD, supple, symmetrical, trachea midline and thyroid not enlarged, symmetric, no tenderness/mass/nodules Lymph nodes: Cervical, supraclavicular, and axillary nodes normal. Heart:regular rate and rhythm, S1, S2 normal, no murmur, click, rub or gallop Lung:chest clear, no wheezing, rales, normal symmetric air entry, Heart exam - S1, S2 normal, no murmur, no gallop, rate regular Abdomen: soft, non-tender, without masses or organomegaly EXT:no erythema, induration, or nodules   Lab Results: Lab Results  Component Value Date   WBC 5.9 04/14/2012   HGB 14.6 04/14/2012   HCT 41.5 04/14/2012   MCV 102.6* 04/14/2012   PLT 197 04/14/2012     Chemistry      Component Value Date/Time   NA 142 02/11/2012 1458   K 3.4* 02/11/2012 1458   CL 102 02/11/2012 1458   CO2 31 02/11/2012 1458   BUN 22 02/11/2012 1458   CREATININE 0.88  02/11/2012 1458      Component Value Date/Time   CALCIUM 9.8 02/11/2012 1458   ALKPHOS 43 02/11/2012 1458   AST 21 02/11/2012 1458   ALT 15 02/11/2012 1458   BILITOT 0.5 02/11/2012 1458     Results for Craig [name] (MRN [id number]) as of 04/14/2012 09:09  Ref. Range 08/29/2011 13:39 11/27/2011 09:58 02/11/2012  14:58  PSA Latest Range: <=4.00 ng/mL 0.40 0.32 0.33    Impression and Plan:  78 year old gentleman with the following issues:  1. Advanced prostate cancer continue to be hormone sensitive at this time with an excellent PSA response to Lupron. Most recent PSA is down to 0.33 with nearly castrate testosterone level. The time being I recommend keeping him on the same medication. I would likely recommend second line hormonal manipulation if his PSA start to go up again. I will see him in follow every about 2 months  2. Bony disease: He is currently on Xgeva. Doing well with that at the time being given at Dr. Retta Diones  3. Bony pain most likely arthritic in nature continue to be stable at this time.  4. Follow-up: In 2 months.  CURCIO, KRISTIN 8/20/201310:32 AM

## 2012-04-17 ENCOUNTER — Other Ambulatory Visit: Payer: Self-pay | Admitting: Urology

## 2012-04-17 DIAGNOSIS — C61 Malignant neoplasm of prostate: Secondary | ICD-10-CM

## 2012-04-23 ENCOUNTER — Encounter (HOSPITAL_COMMUNITY)
Admission: RE | Admit: 2012-04-23 | Discharge: 2012-04-23 | Disposition: A | Discharge status: Home / Self Care | Payer: Medicare Other | Source: Ambulatory Visit | Attending: Urology | Admitting: Urology

## 2012-04-23 DIAGNOSIS — M4120 Other idiopathic scoliosis, site unspecified: Secondary | ICD-10-CM | POA: Insufficient documentation

## 2012-04-23 DIAGNOSIS — C7952 Secondary malignant neoplasm of bone marrow: Secondary | ICD-10-CM | POA: Insufficient documentation

## 2012-04-23 DIAGNOSIS — C61 Malignant neoplasm of prostate: Secondary | ICD-10-CM | POA: Insufficient documentation

## 2012-04-23 DIAGNOSIS — C7951 Secondary malignant neoplasm of bone: Secondary | ICD-10-CM | POA: Insufficient documentation

## 2012-04-23 MED ORDER — TECHNETIUM TC 99M MEDRONATE IV KIT
24.0000 | PACK | Freq: Once | INTRAVENOUS | Status: AC | PRN
  Administered 2012-04-23: 24 via INTRAVENOUS

## 2012-06-16 ENCOUNTER — Telehealth: Payer: Self-pay | Admitting: Oncology

## 2012-06-16 ENCOUNTER — Other Ambulatory Visit (HOSPITAL_BASED_OUTPATIENT_CLINIC_OR_DEPARTMENT_OTHER): Payer: Medicare Other | Admitting: Lab

## 2012-06-16 ENCOUNTER — Ambulatory Visit (HOSPITAL_BASED_OUTPATIENT_CLINIC_OR_DEPARTMENT_OTHER): Payer: Medicare Other | Admitting: Oncology

## 2012-06-16 DIAGNOSIS — C61 Malignant neoplasm of prostate: Secondary | ICD-10-CM

## 2012-06-16 DIAGNOSIS — C7951 Secondary malignant neoplasm of bone: Secondary | ICD-10-CM

## 2012-06-16 LAB — CBC WITH DIFFERENTIAL/PLATELET
BASO%: 0.6 % (ref 0.0–2.0)
HCT: 42.8 % (ref 38.4–49.9)
MCHC: 35.1 g/dL (ref 32.0–36.0)
MONO#: 0.6 10*3/uL (ref 0.1–0.9)
NEUT%: 55 % (ref 39.0–75.0)
RBC: 4.11 10*6/uL — ABNORMAL LOW (ref 4.20–5.82)
WBC: 5.7 10*3/uL (ref 4.0–10.3)
lymph#: 1.7 10*3/uL (ref 0.9–3.3)

## 2012-06-16 LAB — COMPREHENSIVE METABOLIC PANEL (CC13)
ALT: 20 U/L (ref 0–55)
AST: 26 U/L (ref 5–34)
Albumin: 4.2 g/dL (ref 3.5–5.0)
Calcium: 10.3 mg/dL (ref 8.4–10.4)
Chloride: 100 mEq/L (ref 98–107)
Potassium: 4.2 mEq/L (ref 3.5–5.1)
Total Protein: 6.4 g/dL (ref 6.4–8.3)

## 2012-06-16 LAB — PSA: PSA: 0.44 ng/mL (ref <=4.00)

## 2012-06-16 NOTE — Telephone Encounter (Signed)
Gave pt appt for January 2014 lab and MD °

## 2012-06-16 NOTE — Progress Notes (Signed)
Hematology and Oncology Follow Up Visit  Craig [name] [id number] Mar 24, 1926 78 y.o. 06/16/2012 9:07 AM   Principle Diagnosis: 78 year old gentleman with prostate cancer diagnosed in January of 2012. It a Gleason score 4+5 = 9.  Clinical stage TIIC. He has advanced disease to the bone   Current therapy: He has been on [place] since  10/22/2010 and subsequently Lupron on 11/27/2010 and has been on it since that time. He started [place] in June of 2012  Interim History:  Craig [name] presents today for a followup visit. He is a 78 year old gentleman with advanced prostate cancer. He is currently treated with hormonal therapy as well as bone directed therapy at [facility]. His most recent PSA is 0.40. Since the last time I saw him Craig [name] is asymptomatic he does report some diffuse arthritic pain. He had not had any changes in his performance status her activity level he had not had any changes in his quality of life. Is not reporting any constitutional symptoms overall he is doing well.   Medications: I have reviewed the patient's current medications. Current outpatient prescriptions:acyclovir (ZOVIRAX) 200 MG capsule, 400 mg 2 (two) times daily. , Disp: , Rfl: ;  amLODipine (NORVASC) 10 MG tablet, Take 10 mg by mouth daily., Disp: , Rfl: ;  lisinopril (PRINIVIL,ZESTRIL) 40 MG tablet, Take 40 mg by mouth daily. , Disp: , Rfl: ;  aspirin 81 MG tablet, Take 81 mg by mouth daily. Take 1/2 tablet daily , Disp: , Rfl:  fenoprofen (NALFON) 600 MG TABS, Take 600 mg by mouth daily. With Vitamin D , Disp: , Rfl: ;  fish oil-omega-3 fatty acids 1000 MG capsule, Take 1 g by mouth daily., Disp: , Rfl: ;  hydrochlorothiazide (HYDRODIURIL) 50 MG tablet, Take 25 mg by mouth daily. , Disp: , Rfl:   Allergies:  Allergies  Allergen Reactions  . Penicillins     Past Medical History, Surgical history, Social history, and Family History were reviewed and updated.  Review of Systems: Constitutional:   Negative for fever, chills, night sweats, anorexia, weight loss, pain. Cardiovascular: no chest pain or dyspnea on exertion Respiratory: no cough, shortness of breath, or wheezing Neurological: no TIA or stroke symptoms Dermatological: negative ENT: negative Skin: Negative. Gastrointestinal: negative Genito-Urinary: negative Hematological and Lymphatic: negative Breast: negative Musculoskeletal: negative Remaining ROS negative.  Physical Exam: There were no vitals taken for this visit. ECOG: 1 General appearance: alert Head: Normocephalic, without obvious abnormality, atraumatic Neck: no adenopathy, no carotid bruit, no JVD, supple, symmetrical, trachea midline and thyroid not enlarged, symmetric, no tenderness/mass/nodules Lymph nodes: Cervical, supraclavicular, and axillary nodes normal. Heart:regular rate and rhythm, S1, S2 normal, no murmur, click, rub or gallop Lung:chest clear, no wheezing, rales, normal symmetric air entry, Heart exam - S1, S2 normal, no murmur, no gallop, rate regular Abdomen: soft, non-tender, without masses or organomegaly EXT:no erythema, induration, or nodules   Lab Results: Lab Results  Component Value Date   WBC 5.7 06/16/2012   HGB 15.1 06/16/2012   HCT 42.8 06/16/2012   MCV 104.2* 06/16/2012   PLT 206 06/16/2012     Chemistry      Component Value Date/Time   NA 139 04/14/2012 0856   K 3.8 04/14/2012 0856   CL 100 04/14/2012 0856   CO2 30 04/14/2012 0856   BUN 18 04/14/2012 0856   CREATININE 1.00 04/14/2012 0856      Component Value Date/Time   CALCIUM 10.0 04/14/2012 0856   ALKPHOS 36* 04/14/2012 0856  AST 23 04/14/2012 0856   ALT 17 04/14/2012 0856   BILITOT 0.5 04/14/2012 0856      Results for Craig [name] (MRN [id number]) as of 06/16/2012 09:09  Ref. Range 02/11/2012 14:58 04/14/2012 08:56  PSA Latest Range: <=4.00 ng/mL 0.33 0.42    Impression and Plan:  78 year old gentleman with the following issues:  1. Advanced prostate  cancer continue to be hormone sensitive at this time with an excellent PSA response to Lupron. Most recent PSA is down to 0.42 with nearly castrate testosterone level. The time being I recommend keeping him on the same medication. I would likely recommend second line hormonal manipulation if his PSA start to go up again. I will see him in follow every about 3 months  2. Bony disease: He is currently on Xgeva. Doing well with that at the time being given at Dr. Retta Diones  3. Bony pain most likely arthritic in nature continue to be stable at this time.  4. Follow-up: In 3 months.  Craig [name] 10/22/20139:07 AM

## 2012-09-16 ENCOUNTER — Telehealth: Payer: Self-pay | Admitting: Oncology

## 2012-09-16 ENCOUNTER — Other Ambulatory Visit (HOSPITAL_BASED_OUTPATIENT_CLINIC_OR_DEPARTMENT_OTHER): Payer: Medicare Other | Admitting: Lab

## 2012-09-16 ENCOUNTER — Encounter: Payer: Self-pay | Admitting: Oncology

## 2012-09-16 ENCOUNTER — Ambulatory Visit (HOSPITAL_BASED_OUTPATIENT_CLINIC_OR_DEPARTMENT_OTHER): Payer: Medicare Other | Admitting: Oncology

## 2012-09-16 VITALS — BP 160/60 | HR 54 | Temp 97.1°F | Resp 20 | Ht 69.0 in | Wt 166.4 lb

## 2012-09-16 DIAGNOSIS — C61 Malignant neoplasm of prostate: Secondary | ICD-10-CM

## 2012-09-16 DIAGNOSIS — C7952 Secondary malignant neoplasm of bone marrow: Secondary | ICD-10-CM

## 2012-09-16 LAB — COMPREHENSIVE METABOLIC PANEL (CC13)
ALT: 21 U/L (ref 0–55)
AST: 18 U/L (ref 5–34)
Alkaline Phosphatase: 35 U/L — ABNORMAL LOW (ref 40–150)
Calcium: 10.6 mg/dL — ABNORMAL HIGH (ref 8.4–10.4)
Chloride: 97 mEq/L — ABNORMAL LOW (ref 98–107)
Creatinine: 1 mg/dL (ref 0.7–1.3)
Potassium: 3.8 mEq/L (ref 3.5–5.1)

## 2012-09-16 LAB — CBC WITH DIFFERENTIAL/PLATELET
BASO%: 0.4 % (ref 0.0–2.0)
EOS%: 0.6 % (ref 0.0–7.0)
MCH: 35.3 pg — ABNORMAL HIGH (ref 27.2–33.4)
MCHC: 34.9 g/dL (ref 32.0–36.0)
NEUT%: 68.1 % (ref 39.0–75.0)
RDW: 13.1 % (ref 11.0–14.6)
lymph#: 1.9 10*3/uL (ref 0.9–3.3)

## 2012-09-16 NOTE — Patient Instructions (Addendum)
Results for DOMANIC, MATUSEK (MRN [id number]) as of 09/16/2012 10:44  Ref. Range 08/29/2011 13:39 11/27/2011 09:58 02/11/2012 14:58 04/14/2012 08:56 06/16/2012 08:29  PSA Latest Range: <=4.00 ng/mL 0.40 0.32 0.33 0.42 0.44

## 2012-09-16 NOTE — Telephone Encounter (Signed)
Gave pt appt for April 2014 lab and MD °

## 2012-09-16 NOTE — Progress Notes (Signed)
Hematology and Oncology Follow Up Visit  Craig [name] [id number] Dec 18, 1925 79 y.o. 09/16/2012 11:37 AM   Principle Diagnosis: 79 year old gentleman with prostate cancer diagnosed in January of 2012. It a Gleason score 4+5 = 9.  Clinical stage TIIC. He has advanced disease to the bone   Current therapy: He has been on [place] since  10/22/2010 and subsequently Lupron on 11/27/2010 and has been on it since that time. He started [place] in June of 2012  Interim History:  Mr. Craig [name] presents today for a followup visit. He is a 79 year old gentleman with advanced prostate cancer. He is currently treated with hormonal therapy as well as bone directed therapy at [facility]. His most recent PSA is 0.44. Since the last time I saw him Mr. Yu is asymptomatic he does report some diffuse arthritic pain. May require right knee replacement in the near future. He had not had any changes in his performance status her activity level he had not had any changes in his quality of life. Is not reporting any constitutional symptoms overall he is doing well.   Medications: I have reviewed the patient's current medications. Current outpatient prescriptions:acyclovir (ZOVIRAX) 200 MG capsule, 400 mg 2 (two) times daily. , Disp: , Rfl: ;  amLODipine (NORVASC) 10 MG tablet, Take 10 mg by mouth daily., Disp: , Rfl: ;  aspirin 81 MG tablet, Take 40.5 mg by mouth daily. Take 1/2 tablet daily, Disp: , Rfl: ;  atorvastatin (LIPITOR) 10 MG tablet, Take 10 mg by mouth 3 (three) times a week., Disp: , Rfl:  fenoprofen (NALFON) 600 MG TABS, Take 600 mg by mouth daily. With Vitamin D , Disp: , Rfl: ;  fish oil-omega-3 fatty acids 1000 MG capsule, Take 1 g by mouth daily., Disp: , Rfl: ;  hydrochlorothiazide (HYDRODIURIL) 50 MG tablet, Take 25 mg by mouth daily. , Disp: , Rfl: ;  lisinopril (PRINIVIL,ZESTRIL) 40 MG tablet, Take 40 mg by mouth daily. , Disp: , Rfl: ;  naproxen sodium (ANAPROX) 220 MG tablet, Take 440 mg by  mouth daily., Disp: , Rfl:   Allergies:  Allergies  Allergen Reactions  . Penicillins     Past Medical History, Surgical history, Social history, and Family History were reviewed and updated.  Review of Systems: Constitutional:  Negative for fever, chills, night sweats, anorexia, weight loss, pain. Cardiovascular: no chest pain or dyspnea on exertion Respiratory: no cough, shortness of breath, or wheezing Neurological: no TIA or stroke symptoms Dermatological: negative ENT: negative Skin: Negative. Gastrointestinal: negative Genito-Urinary: negative Hematological and Lymphatic: negative Breast: negative Musculoskeletal: negative Remaining ROS negative.  Physical Exam: Blood pressure 160/60, pulse 54, temperature 97.1 F (36.2 C), temperature source Oral, resp. rate 20, height 5\' 9"  (1.753 m), weight 166 lb 6.4 oz (75.479 kg). ECOG: 1 General appearance: alert Head: Normocephalic, without obvious abnormality, atraumatic Neck: no adenopathy, no carotid bruit, no JVD, supple, symmetrical, trachea midline and thyroid not enlarged, symmetric, no tenderness/mass/nodules Lymph nodes: Cervical, supraclavicular, and axillary nodes normal. Heart:regular rate and rhythm, S1, S2 normal, no murmur, click, rub or gallop Lung:chest clear, no wheezing, rales, normal symmetric air entry, Heart exam - S1, S2 normal, no murmur, no gallop, rate regular Abdomen: soft, non-tender, without masses or organomegaly EXT:no erythema, induration, or nodules   Lab Results: Lab Results  Component Value Date   WBC 8.7 09/16/2012   HGB 14.9 09/16/2012   HCT 42.6 09/16/2012   MCV 101.3* 09/16/2012   PLT 253 09/16/2012     Chemistry  Component Value Date/Time   NA 137 09/16/2012 1025   NA 139 04/14/2012 0856   K 3.8 09/16/2012 1025   K 3.8 04/14/2012 0856   CL 97* 09/16/2012 1025   CL 100 04/14/2012 0856   CO2 31* 09/16/2012 1025   CO2 30 04/14/2012 0856   BUN 21.0 09/16/2012 1025   BUN 18 04/14/2012  0856   CREATININE 1.0 09/16/2012 1025   CREATININE 1.00 04/14/2012 0856      Component Value Date/Time   CALCIUM 10.6* 09/16/2012 1025   CALCIUM 10.0 04/14/2012 0856   ALKPHOS 35* 09/16/2012 1025   ALKPHOS 36* 04/14/2012 0856   AST 18 09/16/2012 1025   AST 23 04/14/2012 0856   ALT 21 09/16/2012 1025   ALT 17 04/14/2012 0856   BILITOT 0.65 09/16/2012 1025   BILITOT 0.5 04/14/2012 0856      Results for Craig [name] (MRN [id number]) as of 09/16/2012 10:44  Ref. Range 08/29/2011 13:39 11/27/2011 09:58 02/11/2012 14:58 04/14/2012 08:56 06/16/2012 08:29  PSA Latest Range: <=4.00 ng/mL 0.40 0.32 0.33 0.42 0.44    Impression and Plan:  79 year old gentleman with the following issues:  1. Advanced prostate cancer continue to be hormone sensitive at this time with an excellent PSA response to Lupron. Most recent PSA was 0.44 with nearly castrate testosterone level. The time being I recommend keeping him on the same medication. I would likely recommend second line hormonal manipulation if his PSA start to go up again. I will see him in follow every about 3 months  2. Bony disease: He is currently on Xgeva. Doing well with that at the time being given at Dr. Retta Diones  3. Bony pain most likely arthritic in nature continue to be stable at this time.  4. Follow-up: In 3 months.  [place], [place] 1/22/201411:37 AM

## 2012-10-13 ENCOUNTER — Other Ambulatory Visit: Payer: Self-pay | Admitting: Neurology

## 2012-10-13 DIAGNOSIS — M48061 Spinal stenosis, lumbar region without neurogenic claudication: Secondary | ICD-10-CM

## 2012-10-13 DIAGNOSIS — R269 Unspecified abnormalities of gait and mobility: Secondary | ICD-10-CM

## 2012-10-13 DIAGNOSIS — M79609 Pain in unspecified limb: Secondary | ICD-10-CM

## 2012-10-14 ENCOUNTER — Ambulatory Visit
Admission: RE | Admit: 2012-10-14 | Discharge: 2012-10-14 | Disposition: A | Discharge status: Home / Self Care | Payer: Medicare Other | Source: Ambulatory Visit | Attending: Neurology | Admitting: Neurology

## 2012-10-14 ENCOUNTER — Other Ambulatory Visit: Payer: Self-pay | Admitting: Neurology

## 2012-10-14 DIAGNOSIS — M48061 Spinal stenosis, lumbar region without neurogenic claudication: Secondary | ICD-10-CM

## 2012-10-14 DIAGNOSIS — M79609 Pain in unspecified limb: Secondary | ICD-10-CM

## 2012-12-04 IMAGING — CT CT HEAD W/O CM
1 series · 16 of 30 positions shown, 20 images · non-contrast
Comparison: None.

CLINICAL DATA: 84-year-old male with hypertension and dizziness.

CT HEAD WITHOUT CONTRAST
TECHNIQUE: Contiguous axial images were obtained from the base of
the skull through the vertex without contrast.

[Series 2: head routine 4.8 h37s · axial · 0.43mm/px · z∈[-139,+16]mm · 16 of 36 slices shown, 20 images]
[im 2/36  brain]
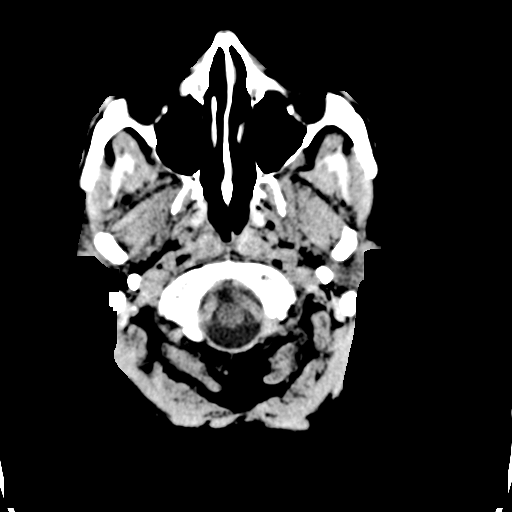
[im 2/36  bone]
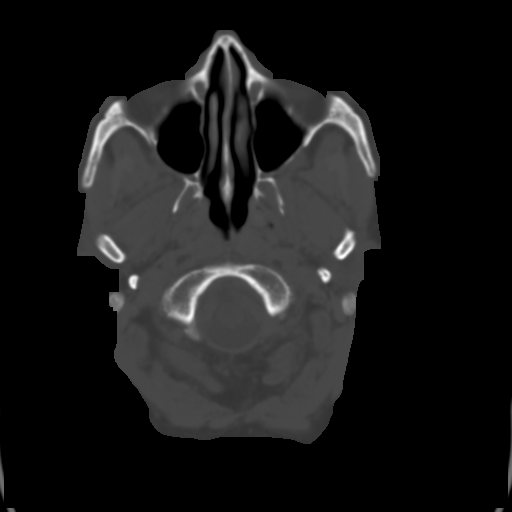
[im 4/36  brain]
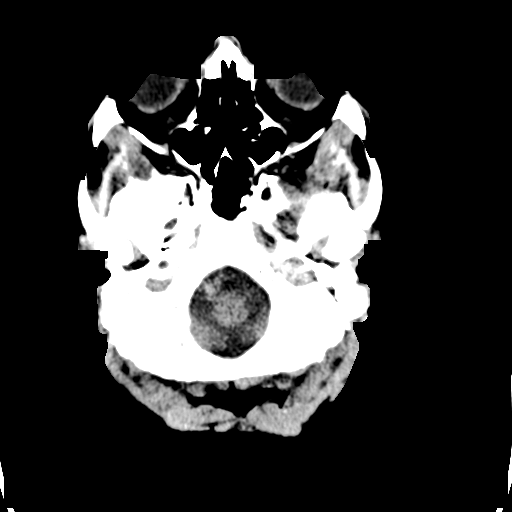
[im 7/36  brain]
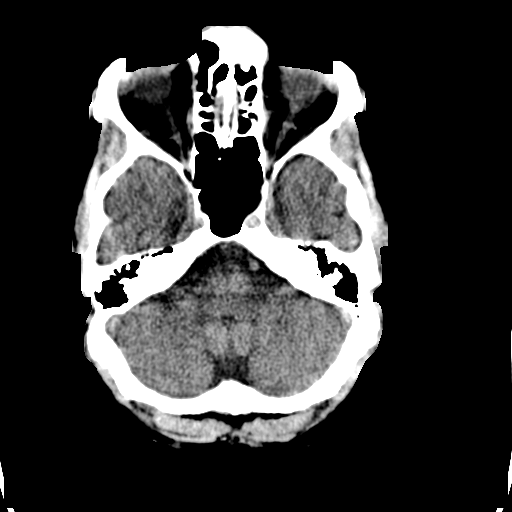
[im 9/36  brain]
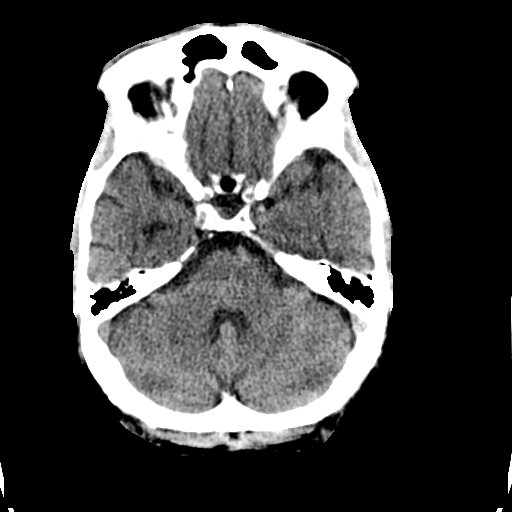
[im 10/36  brain]
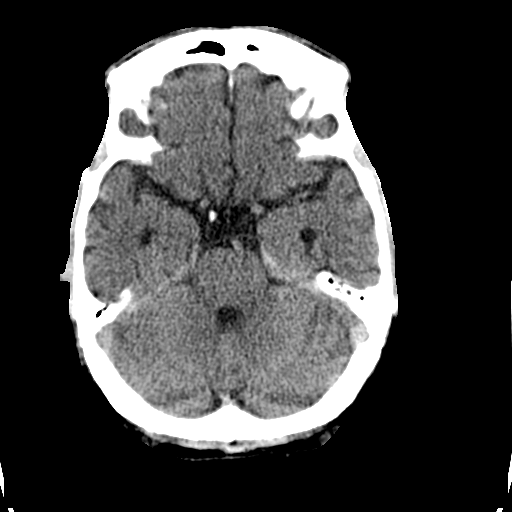
[im 10/36  bone]
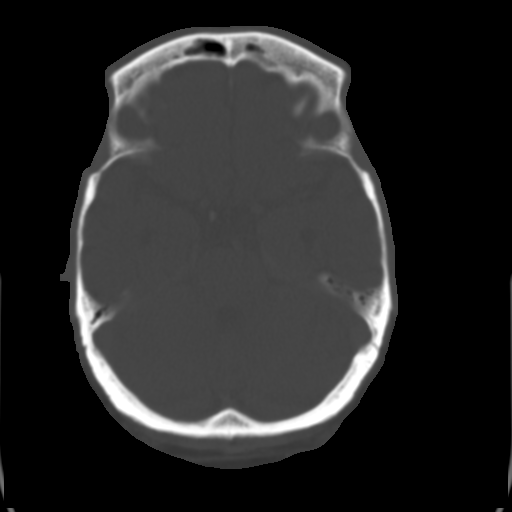
[im 13/36  brain]
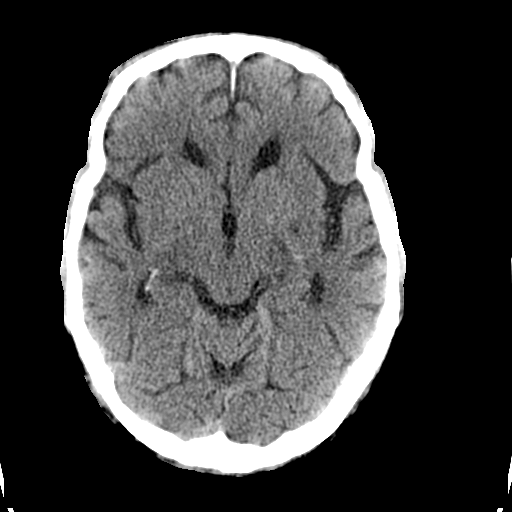
[im 15/36  brain]
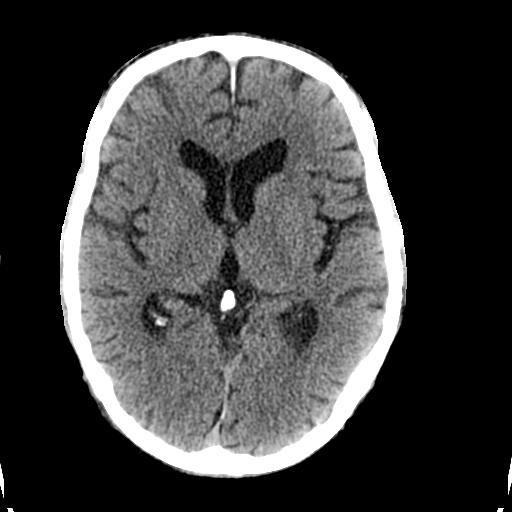
[im 17/36  brain]
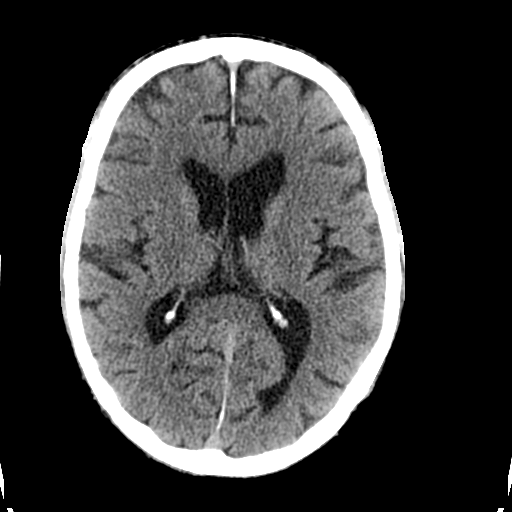
[im 19/36  brain]
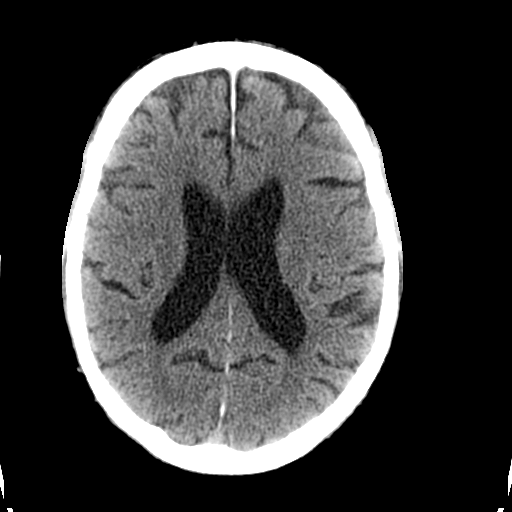
[im 19/36  bone]
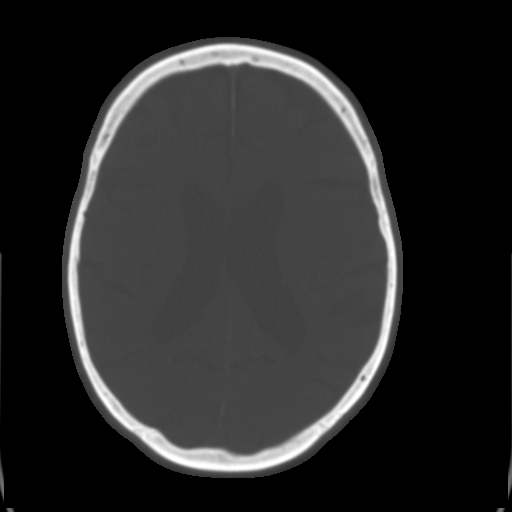
[im 21/36  brain]
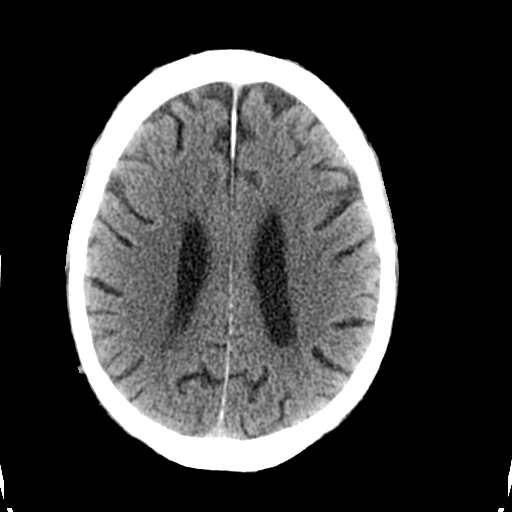
[im 23/36  brain]
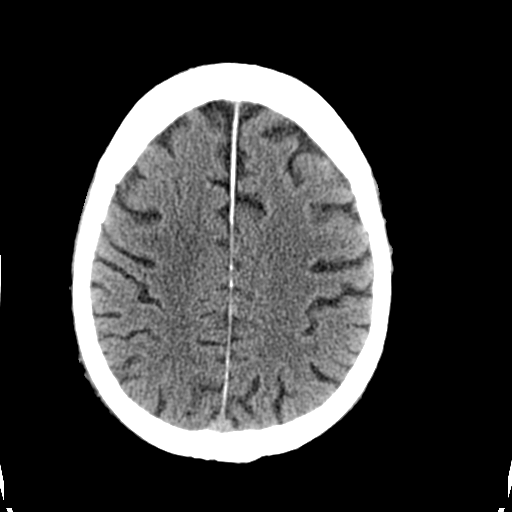
[im 26/36  brain]
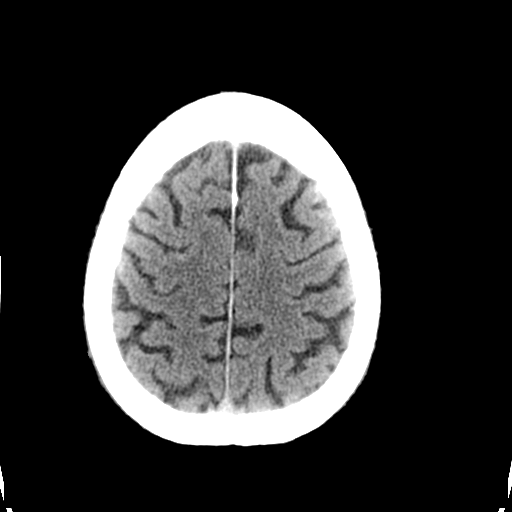
[im 27/36  brain]
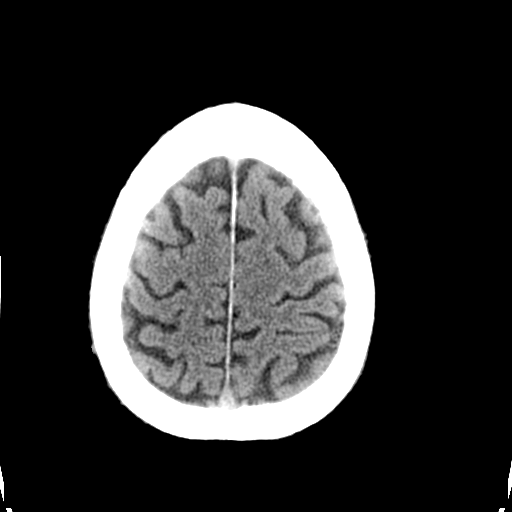
[im 27/36  bone]
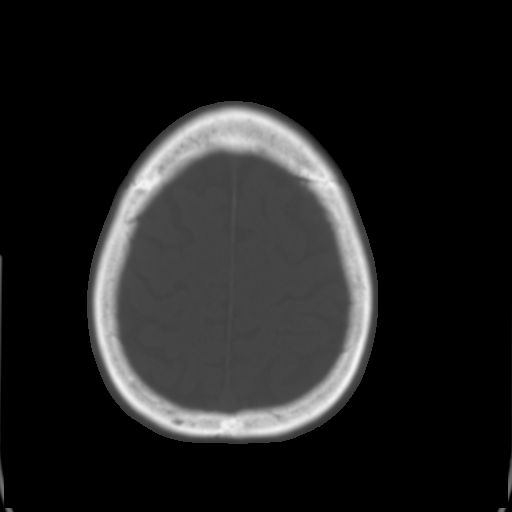
[im 29/36  brain]
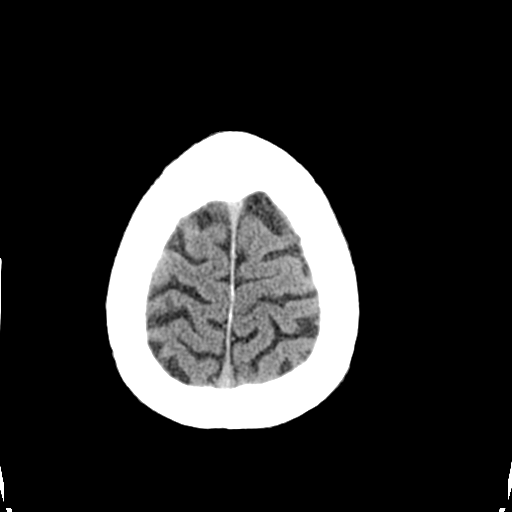
[im 32/36  brain]
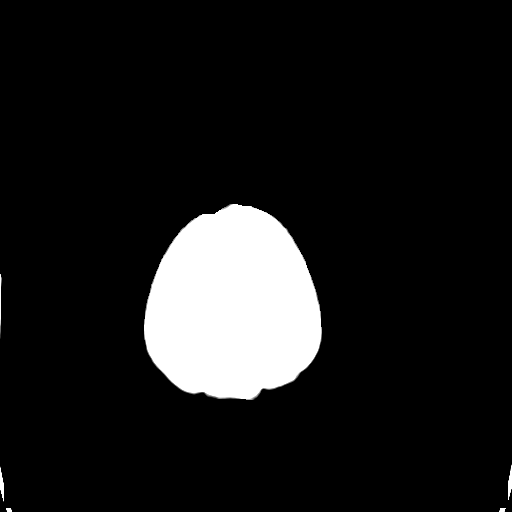
[im 34/36  brain]
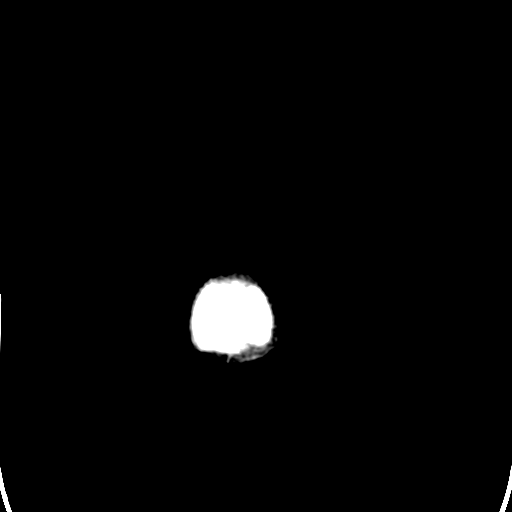

[16 of 30 positions shown; findings below may reference images not displayed]

FINDINGS: No evidence for acute hemorrhage, mass lesion, midline
shift, hydrocephalus or large infarct.  No acute osseous
abnormality.  No significant paranasal sinus disease.
IMPRESSION: No acute intracranial abnormality.

## 2012-12-15 ENCOUNTER — Other Ambulatory Visit: Payer: Medicare Other | Admitting: Lab

## 2012-12-15 ENCOUNTER — Telehealth: Payer: Self-pay | Admitting: Oncology

## 2012-12-15 ENCOUNTER — Other Ambulatory Visit (HOSPITAL_BASED_OUTPATIENT_CLINIC_OR_DEPARTMENT_OTHER): Payer: Medicare Other | Admitting: Lab

## 2012-12-15 ENCOUNTER — Ambulatory Visit (HOSPITAL_BASED_OUTPATIENT_CLINIC_OR_DEPARTMENT_OTHER): Payer: Medicare Other | Admitting: Oncology

## 2012-12-15 VITALS — BP 153/84 | HR 60 | Temp 97.9°F | Resp 20 | Ht 69.0 in | Wt 162.6 lb

## 2012-12-15 DIAGNOSIS — M949 Disorder of cartilage, unspecified: Secondary | ICD-10-CM

## 2012-12-15 DIAGNOSIS — C61 Malignant neoplasm of prostate: Secondary | ICD-10-CM

## 2012-12-15 DIAGNOSIS — C7952 Secondary malignant neoplasm of bone marrow: Secondary | ICD-10-CM

## 2012-12-15 LAB — CBC WITH DIFFERENTIAL/PLATELET
Basophils Absolute: 0 10*3/uL (ref 0.0–0.1)
EOS%: 2.8 % (ref 0.0–7.0)
HGB: 14.3 g/dL (ref 13.0–17.1)
LYMPH%: 28.7 % (ref 14.0–49.0)
MCH: 34.3 pg — ABNORMAL HIGH (ref 27.2–33.4)
MCV: 100.8 fL — ABNORMAL HIGH (ref 79.3–98.0)
MONO%: 9.1 % (ref 0.0–14.0)
NEUT%: 59 % (ref 39.0–75.0)
Platelets: 184 10*3/uL (ref 140–400)
RDW: 13.4 % (ref 11.0–14.6)

## 2012-12-15 LAB — COMPREHENSIVE METABOLIC PANEL (CC13)
AST: 20 U/L (ref 5–34)
Alkaline Phosphatase: 37 U/L — ABNORMAL LOW (ref 40–150)
BUN: 18.5 mg/dL (ref 7.0–26.0)
Creatinine: 1 mg/dL (ref 0.7–1.3)
Glucose: 95 mg/dl (ref 70–99)
Potassium: 4 mEq/L (ref 3.5–5.1)
Total Bilirubin: 0.65 mg/dL (ref 0.20–1.20)

## 2012-12-15 NOTE — Progress Notes (Signed)
Hematology and Oncology Follow Up Visit  Craig [name] [id number] July 02, 1926 79 y.o. 12/15/2012 10:05 AM   Principle Diagnosis: 79 year old gentleman with prostate cancer diagnosed in January of 2012. It a Gleason score 4+5 = 9.  Clinical stage TIIC. He has advanced disease to the bone   Current therapy: He has been on [place] since  10/22/2010 and subsequently Lupron on 11/27/2010 and has been on it since that time. He started [place] in June of 2012  Interim History:  Craig [name] presents today for a followup visit. He is a 79 year old gentleman with advanced prostate cancer. He is currently treated with hormonal therapy as well as bone directed therapy at [facility]. His most recent PSA is 0.74. Since the last time I saw him Craig [name] is asymptomatic he does report some diffuse arthritic pain. May require right knee replacement in the near future. He had not had any changes in his performance status her activity level he had not had any changes in his quality of life. Is not reporting any constitutional symptoms overall he is doing well.   Medications: I have reviewed the patient's current medications. Current outpatient prescriptions:acyclovir (ZOVIRAX) 200 MG capsule, 400 mg daily. , Disp: , Rfl: ;  amLODipine (NORVASC) 10 MG tablet, Take 10 mg by mouth daily., Disp: , Rfl: ;  aspirin 81 MG tablet, Take 40.5 mg by mouth daily. Take 1/2 tablet daily, Disp: , Rfl: ;  atorvastatin (LIPITOR) 10 MG tablet, Take 10 mg by mouth 3 (three) times a week., Disp: , Rfl:  fenoprofen (NALFON) 600 MG TABS, Take 600 mg by mouth daily. With Vitamin D , Disp: , Rfl: ;  fish oil-omega-3 fatty acids 1000 MG capsule, Take 1 g by mouth daily., Disp: , Rfl: ;  hydrochlorothiazide (HYDRODIURIL) 50 MG tablet, Take 25 mg by mouth daily. , Disp: , Rfl: ;  lisinopril (PRINIVIL,ZESTRIL) 40 MG tablet, Take 40 mg by mouth daily. , Disp: , Rfl: ;  naproxen sodium (ANAPROX) 220 MG tablet, Take 440 mg by mouth daily.,  Disp: , Rfl:  diphenoxylate-atropine (LOMOTIL) 2.5-0.025 MG per tablet, Take 1 tablet by mouth daily., Disp: , Rfl:   Allergies:  Allergies  Allergen Reactions  . Penicillins     Past Medical History, Surgical history, Social history, and Family History were reviewed and updated.  Review of Systems: Constitutional:  Negative for fever, chills, night sweats, anorexia, weight loss, pain. Cardiovascular: no chest pain or dyspnea on exertion Respiratory: no cough, shortness of breath, or wheezing Neurological: no TIA or stroke symptoms Dermatological: negative ENT: negative Skin: Negative. Gastrointestinal: negative Genito-Urinary: negative Hematological and Lymphatic: negative Breast: negative Musculoskeletal: negative Remaining ROS negative.  Physical Exam: Blood pressure 153/84, pulse 60, temperature 97.9 F (36.6 C), temperature source Oral, resp. rate 20, height 5\' 9"  (1.753 m), weight 162 lb 9.6 oz (73.755 kg). ECOG: 1 General appearance: alert Head: Normocephalic, without obvious abnormality, atraumatic Neck: no adenopathy, no carotid bruit, no JVD, supple, symmetrical, trachea midline and thyroid not enlarged, symmetric, no tenderness/mass/nodules Lymph nodes: Cervical, supraclavicular, and axillary nodes normal. Heart:regular rate and rhythm, S1, S2 normal, no murmur, click, rub or gallop Lung:chest clear, no wheezing, rales, normal symmetric air entry, Heart exam - S1, S2 normal, no murmur, no gallop, rate regular Abdomen: soft, non-tender, without masses or organomegaly EXT:no erythema, induration, or nodules   Lab Results: Lab Results  Component Value Date   WBC 6.7 12/15/2012   HGB 14.3 12/15/2012   HCT 42.0 12/15/2012  MCV 100.8* 12/15/2012   PLT 184 12/15/2012     Chemistry      Component Value Date/Time   NA 137 09/16/2012 1025   NA 139 04/14/2012 0856   K 3.8 09/16/2012 1025   K 3.8 04/14/2012 0856   CL 97* 09/16/2012 1025   CL 100 04/14/2012 0856   CO2  31* 09/16/2012 1025   CO2 30 04/14/2012 0856   BUN 21.0 09/16/2012 1025   BUN 18 04/14/2012 0856   CREATININE 1.0 09/16/2012 1025   CREATININE 1.00 04/14/2012 0856      Component Value Date/Time   CALCIUM 10.6* 09/16/2012 1025   CALCIUM 10.0 04/14/2012 0856   ALKPHOS 35* 09/16/2012 1025   ALKPHOS 36* 04/14/2012 0856   AST 18 09/16/2012 1025   AST 23 04/14/2012 0856   ALT 21 09/16/2012 1025   ALT 17 04/14/2012 0856   BILITOT 0.65 09/16/2012 1025   BILITOT 0.5 04/14/2012 0856     Results for Craig [name] (MRN [id number]) as of 12/15/2012 09:51  Ref. Range 06/16/2012 08:29 09/16/2012 10:25  PSA Latest Range: <=4.00 ng/mL 0.44 0.74      Impression and Plan:  79 year old gentleman with the following issues:  1. Advanced prostate cancer continue to be hormone sensitive at this time with an excellent PSA response to Lupron. Most recent PSA is up to 0.74 with nearly castrate testosterone level. The time being I recommend keeping him on the same medication. I would likely recommend second line hormonal manipulation if his PSA start to go up again. I will see him in follow every about 3 months. I will repeat bone scan at that time for staging purposes.   2. Bony disease: He is currently on Xgeva. Doing well with that at the time being given at Dr. Retta Diones  3. Bony pain most likely arthritic in nature continue to be stable at this time.  4. Follow-up: In 3 months.  Craig [name] 4/22/201410:05 AM

## 2012-12-16 ENCOUNTER — Telehealth: Payer: Self-pay | Admitting: *Deleted

## 2012-12-16 ENCOUNTER — Other Ambulatory Visit: Payer: Self-pay | Admitting: *Deleted

## 2012-12-16 NOTE — Telephone Encounter (Signed)
Message copied by Reesa Chew on Wed Dec 16, 2012 12:00 PM ------      Message from: Craig [name]      Created: Wed Dec 16, 2012  8:46 AM       Call pt with PSA result. Thanks. ------

## 2012-12-16 NOTE — Telephone Encounter (Signed)
Message copied by Reesa Chew on Wed Dec 16, 2012 11:55 AM ------      Message from: Craig [name]      Created: Wed Dec 16, 2012  8:46 AM       Call pt with PSA result. Thanks. ------

## 2012-12-16 NOTE — Telephone Encounter (Signed)
Patient called. Lm for me to call him. Called patient again, home  # busy, called cell and got VM.

## 2012-12-16 NOTE — Telephone Encounter (Signed)
Spoke with patient, gave results of PSA done 12/15/12.

## 2013-01-28 ENCOUNTER — Other Ambulatory Visit: Payer: Medicare Other

## 2013-01-28 ENCOUNTER — Other Ambulatory Visit: Payer: Self-pay | Admitting: Internal Medicine

## 2013-01-28 DIAGNOSIS — R4781 Slurred speech: Secondary | ICD-10-CM

## 2013-02-01 ENCOUNTER — Other Ambulatory Visit: Payer: Medicare Other

## 2013-02-02 ENCOUNTER — Other Ambulatory Visit (HOSPITAL_COMMUNITY): Payer: Self-pay | Admitting: Internal Medicine

## 2013-02-02 DIAGNOSIS — G459 Transient cerebral ischemic attack, unspecified: Secondary | ICD-10-CM

## 2013-02-10 ENCOUNTER — Ambulatory Visit (HOSPITAL_COMMUNITY)
Admission: RE | Admit: 2013-02-10 | Discharge: 2013-02-10 | Disposition: A | Discharge status: Home / Self Care | Payer: Medicare Other | Source: Ambulatory Visit | Attending: Internal Medicine | Admitting: Internal Medicine

## 2013-02-10 DIAGNOSIS — I369 Nonrheumatic tricuspid valve disorder, unspecified: Secondary | ICD-10-CM

## 2013-02-10 DIAGNOSIS — I6529 Occlusion and stenosis of unspecified carotid artery: Secondary | ICD-10-CM | POA: Insufficient documentation

## 2013-02-10 DIAGNOSIS — C61 Malignant neoplasm of prostate: Secondary | ICD-10-CM

## 2013-02-10 DIAGNOSIS — G459 Transient cerebral ischemic attack, unspecified: Secondary | ICD-10-CM | POA: Insufficient documentation

## 2013-02-10 DIAGNOSIS — I658 Occlusion and stenosis of other precerebral arteries: Secondary | ICD-10-CM | POA: Insufficient documentation

## 2013-02-10 NOTE — Progress Notes (Signed)
VASCULAR LAB PRELIMINARY  PRELIMINARY  PRELIMINARY  PRELIMINARY  Carotid duplex completed.    Preliminary report:  Bilateral - Less than 40% ICA stenosis. Vertebral artery flow is antegrade.  Ambika Zettlemoyer, RVS 02/10/2013, 10:19 AM

## 2013-02-10 NOTE — Progress Notes (Signed)
*  PRELIMINARY RESULTS* Echocardiogram 2D Echocardiogram has been performed.  Craig [name] 02/10/2013, 9:52 AM

## 2013-03-16 ENCOUNTER — Encounter (HOSPITAL_COMMUNITY)
Admission: RE | Admit: 2013-03-16 | Discharge: 2013-03-16 | Disposition: A | Discharge status: Home / Self Care | Payer: Medicare Other | Source: Ambulatory Visit | Attending: Oncology | Admitting: Oncology

## 2013-03-16 ENCOUNTER — Other Ambulatory Visit (HOSPITAL_BASED_OUTPATIENT_CLINIC_OR_DEPARTMENT_OTHER): Payer: Medicare Other

## 2013-03-16 DIAGNOSIS — C61 Malignant neoplasm of prostate: Secondary | ICD-10-CM

## 2013-03-16 LAB — PSA: PSA: 0.23 ng/mL (ref <=4.00)

## 2013-03-16 MED ORDER — TECHNETIUM TC 99M MEDRONATE IV KIT
25.0000 | PACK | Freq: Once | INTRAVENOUS | Status: AC | PRN
  Administered 2013-03-16: 25 via INTRAVENOUS

## 2013-03-23 ENCOUNTER — Ambulatory Visit (HOSPITAL_BASED_OUTPATIENT_CLINIC_OR_DEPARTMENT_OTHER): Payer: Medicare Other | Admitting: Oncology

## 2013-03-23 ENCOUNTER — Telehealth: Payer: Self-pay | Admitting: Oncology

## 2013-03-23 VITALS — BP 150/74 | HR 58 | Temp 97.8°F | Resp 18 | Ht 69.0 in | Wt 166.0 lb

## 2013-03-23 DIAGNOSIS — C61 Malignant neoplasm of prostate: Secondary | ICD-10-CM

## 2013-03-23 NOTE — Telephone Encounter (Signed)
Gave pt appt for lab and Md on November 2014 °

## 2013-03-23 NOTE — Progress Notes (Signed)
Hematology and Oncology Follow Up Visit  [name] [id number] 10/09/1925 78 y.o. 03/23/2013 9:14 AM   Principle Diagnosis: 79 year old gentleman with prostate cancer diagnosed in January of 2012. It a Gleason score 4+5 = 9.  Clinical stage TIIC. He has advanced disease to the bone   Current therapy: He has been on [place] since  10/22/2010 and subsequently Lupron on 11/27/2010 and has been on it since that time. Casodex added in the last few months.  He started [place] in June of 2012  Interim History:  Mr. Toon presents today for a followup visit. He is a 79 year old gentleman with advanced prostate cancer. He is currently treated with hormonal therapy as well as bone directed therapy at [facility]. His most recent PSA is down again to 0.23. Since the last time I saw him Mr. Parfait he continue to be asymptomatic he does report some diffuse arthritic pain. May require right knee replacement in the near future. He had not had any changes in his performance status her activity level he had not had any changes in his quality of life. Is not reporting any constitutional symptoms overall he is doing well.   Medications: I have reviewed the patient's current medications.  Current Outpatient Prescriptions  Medication Sig Dispense Refill  . acyclovir (ZOVIRAX) 200 MG capsule 400 mg daily.       Marland Kitchen atorvastatin (LIPITOR) 10 MG tablet Take 10 mg by mouth 3 (three) times a week.      . bicalutamide (CASODEX) 50 MG tablet Take 50 mg by mouth daily.      . clopidogrel (PLAVIX) 75 MG tablet Take 75 mg by mouth daily.      . fenoprofen (NALFON) 600 MG TABS Take 600 mg by mouth daily. With Vitamin D       . fish oil-omega-3 fatty acids 1000 MG capsule Take 1 g by mouth daily.      . hydrochlorothiazide (HYDRODIURIL) 50 MG tablet Take 25 mg by mouth daily.       Marland Kitchen lisinopril (PRINIVIL,ZESTRIL) 40 MG tablet Take 40 mg by mouth daily.        No current facility-administered medications for this  visit.    Allergies:  Allergies  Allergen Reactions  . Penicillins     Past Medical History, Surgical history, Social history, and Family History were reviewed and updated.  Review of Systems:  Remaining ROS negative.  Physical Exam: Blood pressure 150/74, pulse 58, temperature 97.8 F (36.6 C), temperature source Oral, resp. rate 18, height 5\' 9"  (1.753 m), weight 166 lb (75.297 kg). ECOG: 1 General appearance: alert Head: Normocephalic, without obvious abnormality, atraumatic Neck: no adenopathy, no carotid bruit, no JVD, supple, symmetrical, trachea midline and thyroid not enlarged, symmetric, no tenderness/mass/nodules Lymph nodes: Cervical, supraclavicular, and axillary nodes normal. Heart:regular rate and rhythm, S1, S2 normal, no murmur, click, rub or gallop Lung:chest clear, no wheezing, rales, normal symmetric air entry, Heart exam - S1, S2 normal, no murmur, no gallop, rate regular Abdomen: soft, non-tender, without masses or organomegaly EXT:no erythema, induration, or nodules   Lab Results: Lab Results  Component Value Date   WBC 6.7 12/15/2012   HGB 14.3 12/15/2012   HCT 42.0 12/15/2012   MCV 100.8* 12/15/2012   PLT 184 12/15/2012     Chemistry      Component Value Date/Time   NA 141 12/15/2012 0934   NA 139 04/14/2012 0856   K 4.0 12/15/2012 0934   K 3.8 04/14/2012 0856  CL 100 12/15/2012 0934   CL 100 04/14/2012 0856   CO2 30* 12/15/2012 0934   CO2 30 04/14/2012 0856   BUN 18.5 12/15/2012 0934   BUN 18 04/14/2012 0856   CREATININE 1.0 12/15/2012 0934   CREATININE 1.00 04/14/2012 0856      Component Value Date/Time   CALCIUM 9.8 12/15/2012 0934   CALCIUM 10.0 04/14/2012 0856   ALKPHOS 37* 12/15/2012 0934   ALKPHOS 36* 04/14/2012 0856   AST 20 12/15/2012 0934   AST 23 04/14/2012 0856   ALT 19 12/15/2012 0934   ALT 17 04/14/2012 0856   BILITOT 0.65 12/15/2012 0934   BILITOT 0.5 04/14/2012 0856      Results for Craig [name] (MRN [id number]) as of 03/23/2013  09:05  Ref. Range 12/15/2012 09:34 03/16/2013 08:43  PSA Latest Range: <=4.00 ng/mL 0.70 0.23   Bone scan on 03/16/2013.  IMPRESSION:  1. Negative for metastatic disease.  2. 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    Impression and Plan:  79 year old gentleman with the following issues:  1. Advanced prostate cancer continue to be hormone sensitive at this time with an excellent PSA response to Lupron and casodex. Most recent PSA is up to 0.23 with nearly castrate testosterone level. The time being I recommend keeping him on the same medication. I would likely recommend second line hormonal manipulation if his PSA start to go up again. I will see him in follow every about 4 months.   2. Bony disease: He is currently on Xgeva. Doing well with that at the time being given at Dr. Retta Diones  3. Bony pain most likely arthritic in nature continue to be stable at this time.  4. Follow-up: In 4 months.  Craig [name] 7/29/20149:14 AM

## 2013-05-05 ENCOUNTER — Encounter (HOSPITAL_COMMUNITY): Payer: Self-pay

## 2013-05-05 ENCOUNTER — Emergency Department (INDEPENDENT_AMBULATORY_CARE_PROVIDER_SITE_OTHER)
Admission: EM | Admit: 2013-05-05 | Discharge: 2013-05-05 | Disposition: A | Discharge status: Home / Self Care | Payer: Medicare Other | Source: Home / Self Care | Attending: Emergency Medicine | Admitting: Emergency Medicine

## 2013-05-05 DIAGNOSIS — T148XXA Other injury of unspecified body region, initial encounter: Secondary | ICD-10-CM

## 2013-05-05 DIAGNOSIS — IMO0002 Reserved for concepts with insufficient information to code with codable children: Secondary | ICD-10-CM

## 2013-05-05 DIAGNOSIS — Z23 Encounter for immunization: Secondary | ICD-10-CM

## 2013-05-05 MED ORDER — TETANUS-DIPHTH-ACELL PERTUSSIS 5-2.5-18.5 LF-MCG/0.5 IM SUSP
0.5000 mL | Freq: Once | INTRAMUSCULAR | Status: AC
  Administered 2013-05-05: 0.5 mL via INTRAMUSCULAR

## 2013-05-05 MED ORDER — TETANUS-DIPHTH-ACELL PERTUSSIS 5-2.5-18.5 LF-MCG/0.5 IM SUSP
INTRAMUSCULAR | Status: AC
  Filled 2013-05-05: qty 0.5

## 2013-05-05 NOTE — ED Notes (Signed)
States he removed bandage yesterday, and sustained skin tear left arm; NAD, bleeding controlled

## 2013-05-05 NOTE — ED Provider Notes (Signed)
Chief Complaint:   Chief Complaint  Patient presents with  . Skin Problem    History of Present Illness:   Craig [name] is an 79 year old male who is a resident of [facility]. He abraded his left forearm yesterday on a sliding glass door. He put an adhesive bandage on and today when he removed his bandage, a small area of skin was avulsed. This is been oozing a little bit of blood. There is no surrounding erythema or purulent drainage.  Review of Systems:  Other than noted above, the patient denies any of the following symptoms: Systemic:  No fever or chills. Musculoskeletal:  No joint pain or decreased range of motion. Neuro:  No numbness, tingling, or weakness.  PMFSH:  Past medical history, family history, social history, meds, and allergies were reviewed. He is allergic to penicillin. He has osteoarthritis, hypertension, and hypercholesterolemia. He takes Norvasc, Lipitor, Casodex, Plavix, HydroDIURIL, lisinopril and a multivitamin.  Physical Exam:   Vital signs:  BP 132/66  Pulse 52  Temp(Src) 97.6 F (36.4 C) (Oral)  Resp 16  SpO2 100% Ext:  There is a 1.5 x 1 cm area of skin evulsion the forearm with some surrounding bruising. His skin is very thin. This was oozing just a small amount of blood. No surrounding erythema or purulent drainage.  All other joints had a full ROM without pain.  Pulses were full.  Good capillary refill in all digits.  No edema. Neurological:  Alert and oriented.  No muscle weakness.  Sensation was intact to light touch.   Course in Urgent Care Center:   The wound was cleansed with saline, antibiotic ointment was applied followed by Telfa dressing and roll gauze. He was instructed in wound care. Would like him to avoid putting any of these on his skin.  Assessment:  The encounter diagnosis was Skin tear.  No need for any suturing.  Plan:   1.  Meds:  The following meds were prescribed:   Discharge Medication List as of  05/05/2013 11:23 AM      2.  Patient Education/Counseling:  The patient was given appropriate handouts, self care instructions, and instructed in symptomatic relief. Instructions were given for wound care.  He should try to avoid putting any of his son his skin and watch for signs of infection.  3.  Follow up:  The patient was told to follow up immediately if there is any sign of infection.The patient will return if any sign of infection.      Reuben Likes, MD 05/05/13 [phone]

## 2013-05-12 ENCOUNTER — Other Ambulatory Visit: Payer: Self-pay | Admitting: Urology

## 2013-05-12 DIAGNOSIS — C61 Malignant neoplasm of prostate: Secondary | ICD-10-CM

## 2013-05-21 ENCOUNTER — Encounter (HOSPITAL_COMMUNITY)
Admission: RE | Admit: 2013-05-21 | Discharge: 2013-05-21 | Disposition: A | Discharge status: Home / Self Care | Payer: Medicare Other | Source: Ambulatory Visit | Attending: Urology | Admitting: Urology

## 2013-05-21 ENCOUNTER — Encounter (HOSPITAL_COMMUNITY): Payer: Medicare Other

## 2013-05-21 DIAGNOSIS — C61 Malignant neoplasm of prostate: Secondary | ICD-10-CM

## 2013-06-03 ENCOUNTER — Other Ambulatory Visit: Payer: Self-pay | Admitting: Orthopedic Surgery

## 2013-06-14 ENCOUNTER — Encounter (HOSPITAL_COMMUNITY): Payer: Self-pay | Admitting: Pharmacy Technician

## 2013-06-14 ENCOUNTER — Other Ambulatory Visit (HOSPITAL_COMMUNITY): Payer: Self-pay | Admitting: *Deleted

## 2013-06-15 ENCOUNTER — Encounter (HOSPITAL_COMMUNITY): Payer: Self-pay

## 2013-06-15 ENCOUNTER — Ambulatory Visit (HOSPITAL_COMMUNITY)
Admission: RE | Admit: 2013-06-15 | Discharge: 2013-06-15 | Disposition: A | Discharge status: Home / Self Care | Payer: Medicare Other | Source: Ambulatory Visit | Attending: Orthopedic Surgery | Admitting: Orthopedic Surgery

## 2013-06-15 ENCOUNTER — Encounter (HOSPITAL_COMMUNITY)
Admission: RE | Admit: 2013-06-15 | Discharge: 2013-06-15 | Disposition: A | Discharge status: Home / Self Care | Payer: Medicare Other | Source: Ambulatory Visit | Attending: Orthopedic Surgery | Admitting: Orthopedic Surgery

## 2013-06-15 ENCOUNTER — Other Ambulatory Visit: Payer: Self-pay | Admitting: Orthopedic Surgery

## 2013-06-15 DIAGNOSIS — I498 Other specified cardiac arrhythmias: Secondary | ICD-10-CM | POA: Insufficient documentation

## 2013-06-15 DIAGNOSIS — Z01810 Encounter for preprocedural cardiovascular examination: Secondary | ICD-10-CM | POA: Insufficient documentation

## 2013-06-15 DIAGNOSIS — Z01812 Encounter for preprocedural laboratory examination: Secondary | ICD-10-CM | POA: Insufficient documentation

## 2013-06-15 DIAGNOSIS — J438 Other emphysema: Secondary | ICD-10-CM | POA: Insufficient documentation

## 2013-06-15 DIAGNOSIS — M47817 Spondylosis without myelopathy or radiculopathy, lumbosacral region: Secondary | ICD-10-CM | POA: Insufficient documentation

## 2013-06-15 DIAGNOSIS — M4130 Thoracogenic scoliosis, site unspecified: Secondary | ICD-10-CM | POA: Insufficient documentation

## 2013-06-15 DIAGNOSIS — M1710 Unilateral primary osteoarthritis, unspecified knee: Secondary | ICD-10-CM | POA: Insufficient documentation

## 2013-06-15 DIAGNOSIS — M19019 Primary osteoarthritis, unspecified shoulder: Secondary | ICD-10-CM | POA: Insufficient documentation

## 2013-06-15 DIAGNOSIS — J8410 Pulmonary fibrosis, unspecified: Secondary | ICD-10-CM | POA: Insufficient documentation

## 2013-06-15 DIAGNOSIS — Z01818 Encounter for other preprocedural examination: Secondary | ICD-10-CM | POA: Insufficient documentation

## 2013-06-15 DIAGNOSIS — M47814 Spondylosis without myelopathy or radiculopathy, thoracic region: Secondary | ICD-10-CM | POA: Insufficient documentation

## 2013-06-15 LAB — COMPREHENSIVE METABOLIC PANEL
AST: 22 U/L (ref 0–37)
Albumin: 4.3 g/dL (ref 3.5–5.2)
BUN: 18 mg/dL (ref 6–23)
CO2: 31 mEq/L (ref 19–32)
Calcium: 10.2 mg/dL (ref 8.4–10.5)
Creatinine, Ser: 0.98 mg/dL (ref 0.50–1.35)
GFR calc Af Amer: 85 mL/min — ABNORMAL LOW (ref >90)
Total Protein: 6.9 g/dL (ref 6.0–8.3)

## 2013-06-15 LAB — CBC
HCT: 38.6 % — ABNORMAL LOW (ref 39.0–52.0)
MCHC: 36.3 g/dL — ABNORMAL HIGH (ref 30.0–36.0)
MCV: 96.3 fL (ref 78.0–100.0)
Platelets: 241 10*3/uL (ref 150–400)
RBC: 4.01 MIL/uL — ABNORMAL LOW (ref 4.22–5.81)
RDW: 12.4 % (ref 11.5–15.5)
WBC: 7.2 10*3/uL (ref 4.0–10.5)

## 2013-06-15 LAB — URINALYSIS, ROUTINE W REFLEX MICROSCOPIC
Bilirubin Urine: NEGATIVE
Hgb urine dipstick: NEGATIVE
Ketones, ur: NEGATIVE mg/dL
Leukocytes, UA: NEGATIVE
Nitrite: NEGATIVE
Protein, ur: NEGATIVE mg/dL
Urobilinogen, UA: 1 mg/dL (ref 0.0–1.0)
pH: 7 (ref 5.0–8.0)

## 2013-06-15 LAB — ABO/RH: ABO/RH(D): A POS

## 2013-06-15 LAB — PROTIME-INR
INR: 1.04 (ref 0.00–1.49)
Prothrombin Time: 13.4 seconds (ref 11.6–15.2)

## 2013-06-15 LAB — SURGICAL PCR SCREEN: MRSA, PCR: NEGATIVE

## 2013-06-15 MED ORDER — CHLORHEXIDINE GLUCONATE 4 % EX LIQD
60.0000 mL | Freq: Once | CUTANEOUS | Status: DC
  Filled 2013-06-15: qty 60

## 2013-06-15 NOTE — Patient Instructions (Signed)
Craig [name]  06/15/2013   Your procedure is scheduled on:   Report to [facility] at  1205[facility].  Call this number if you have problems the morning of surgery: [phone]   Remember: Do not take lisinopril, ASA, Plavix   Do not eat food or drink liquids after midnight.   Take these medicines the morning of surgery with A SIP OF WATER:   Do not wear jewelry, make-up or nail polish.  Do not wear lotions, powders, or perfumes. You may wear deodorant.  Do not shave 48  hours prior to surgery. Men may shave face and neck.  Do not bring valuables to the hospital.  [facility] is not responsible for any belongings or valuables.                 Contacts, dentures or bridgework may not be worn into surgery.  Leave suitcase in the car. After surgery it may be brought to your room.  For patients admitted to the hospital, checkout time is 11:00 AM the day of discharge  .   Patients discharged the day of surgery will not be allowed to drive  home.  Name and phone number of your driver:   Special Instructions:   Shower using CHG 2 nights before surgery and the night before surgery.  If you shower the day of surgery use CHG.  Use special wash - you have one bottle of CHG for all showers.  You should use approximately 1/3 of the bottle for each shower.   Please read over the following fact sheets that you were given: MRSA Information       Questions please call  Johnette Abraham RN     [phone]    Patient signature _______________________________________  Nurse signature ________________________________________

## 2013-06-15 NOTE — H&P (Signed)
Kristine Garbe. Adrienne Mocha  DOB: 09-11-1925 Divorced / Language: [place] / Race: White Male  Date of Admission:  06/21/2013  Chief Complaint;  Right Knee Pain  History of Present Illness The patient is a 79 year old male who comes in for a preoperative History and Physical. The patient is scheduled for a right total knee arthroplasty to be performed by Dr. Gus Rankin. Aluisio, MD at [facility] on 06/21/2013. The patient is a 79 year old male who presents for follow up of their knee. The patient is being followed for their right knee pain. They are now several month(s) out from aspiration and injection. Symptoms reported today include: pain. The patient feels that they are doing poorly. The following medication has been used for pain control: none. Note for "Follow-up Knee": He feels like he is ready for surgery. It is getting progressively more difficult for him to do activities he desires. The injections are no longer helping him. He is having pain throughout the day as well as some discomfort at night. He is ready to proceed with knee replacement surgery. They have been treated conservatively in the past for the above stated problem and despite conservative measures, they continue to have progressive pain and severe functional limitations and dysfunction. They have failed non-operative management including home exercise, medications. It is felt that they would benefit from undergoing total joint replacement. Risks and benefits of the procedure have been discussed with the patient and they elect to proceed with surgery. There are no active contraindications to surgery such as ongoing infection or rapidly progressive neurological disease.   Problem List Osteoarthritis, knee (715.96)   Allergies PENICILLIN    Family History Kidney disease. father Hypertension. First Degree Relatives. mother and father    Social History Pain Contract. no Exercise. Exercises  weekly; does running / walking Alcohol use. current drinker; drinks hard liquor; only occasionally per week Tobacco use. Former smoker. former smoker; smoke(d) 2 1/2 pack(s) per day Living situation. Lives alone. Post-Surgical Plans. Plans to go to [facility]. Advance Directives. Living Will, Healthcare POA    Medication History Atorvastatin Calcium (10MG  Tablet, Oral) Active. Hydrochlorothiazide (50MG  Tablet, Oral) Active. Lisinopril (40MG  Tablet, Oral) Active. Plavix (75MG  Tablet, Oral) Active. Acyclovir (200MG  Capsule, Oral) Active. AmLODIPine Besylate (5MG  Tablet, Oral) Active. Bicalutamide (50MG  Tablet, Oral) Active. LORazepam (0.5MG  Tablet, Oral) Active.    Past Surgical History Cataract Surgery. bilateral Colon Polyp Removal - Colonoscopy Hemorrhoidectomy Straighten Nasal Septum Tonsillectomy Mastoidectomy. bilateral Prostatectomy; Transurethral    Medical History Osteoarthritis Prostate Cancer Hypercholesterolemia Gastroesophageal Reflux Disease High blood pressure Prostate Disease Cervicalgia (723.1). 08/11/2004 Lumbago (724.2). 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 01/11/2005 Anxiety Disorder Impaired Vision Cataract Bronchitis Hemorrhoids Diverticulosis Mumps TIA (transient ischemic attack) (435.9)   Review of Systems General:Present- Night Sweats. Not Present- Chills, Fever, Fatigue, Weight Gain, Weight Loss and Memory Loss. Skin:Not Present- Hives, Itching, Rash, Eczema and Lesions. HEENT:Not Present- Tinnitus, Headache, Double Vision, Visual Loss, Hearing Loss and Dentures. Respiratory:Not Present- Shortness of breath with exertion, Shortness of breath at rest, Allergies, Coughing up blood and Chronic Cough. Cardiovascular:Not Present- Chest Pain, Racing/skipping heartbeats, Difficulty Breathing Lying Down, Murmur, Swelling and Palpitations. Gastrointestinal:Not Present- Bloody  Stool, Heartburn, Abdominal Pain, Vomiting, Nausea, Constipation, Diarrhea, Difficulty Swallowing, Jaundice and Loss of appetitie. Male Genitourinary:Present- Urinary frequency and Urgency. Not Present- Blood in Urine, Weak urinary stream, Discharge, Flank Pain, Incontinence, Painful Urination, Urinary Retention and Urinating at Night. Musculoskeletal:Present- Joint Pain and Back Pain. Not  Present- Muscle Weakness, Muscle Pain, Joint Swelling, Morning Stiffness and Spasms. Neurological:Not Present- Tremor, Dizziness, Blackout spells, Paralysis, Difficulty with balance and Weakness. Psychiatric:Not Present- Insomnia.    Vitals Pulse: 64 (Regular) Resp.: 16 (Unlabored) BP: 148/84 (Sitting, Right Arm, Standard)     Physical Exam The physical exam findings are as follows:  Note: Patient is an 79 year old male with continued knee pain. Patient is accompanied today by his daughter Misty Stanley.   General Mental Status - Alert, cooperative and good historian. General Appearance- pleasant. Not in acute distress. Orientation- Oriented X3. Build & Nutrition- Well nourished and Well developed.   Head and Neck Head- normocephalic, atraumatic . Neck Global Assessment- supple. no bruit auscultated on the right and no bruit auscultated on the left.  upper dentures  Eye Vision- Wears corrective lenses. Pupil- Bilateral- Regular and Round. Motion- Bilateral- EOMI.   Chest and Lung Exam Auscultation: Breath sounds:- clear at anterior chest wall and - clear at posterior chest wall. Adventitious sounds:- No Adventitious sounds.   Cardiovascular Auscultation:Rhythm- Regular rate and rhythm. Heart Sounds- S1 WNL and S2 WNL. Murmurs & Other Heart Sounds:Auscultation of the heart reveals - No Murmurs.   Abdomen Palpation/Percussion:Tenderness- Abdomen is non-tender to palpation. Rigidity (guarding)- Abdomen is soft. Auscultation:Auscultation of the  abdomen reveals - Bowel sounds normal.   Male Genitourinary Not done, not pertinent to present illness  Musculoskeletal On exam well developed male alert and oriented in no apparent distress. Evaluation of his right knee shows slight valgus. His range of motion is about 5 to 125. He is tender lateral greater than medial with no instability. His left knee exam is normal.  RADIOGRAPHS: We reviewed his radiographs from earlier this year and he has essentially bone on bone in the lateral compartment and patellofemoral compartment also.   Assessment & Plan Osteoarthritis, knee (715.96)  Note: Plan is for a Right Total Knee Replacement by Dr. Lequita Halt.  Plan is to go to [facility].  PCP - Dr. Burton Apley - Patient has been seen preoperatively and felt to be stable for surgery. Urology - Dr. Tama Gander Oncology - Dr. Mordecai Rasmussen  The patient will not receive TXA (tranexamic acid) due to: Bladder Cancer  Signed electronically by Travares Nelles Tessie Fass, III PA-C

## 2013-06-21 ENCOUNTER — Inpatient Hospital Stay (HOSPITAL_COMMUNITY): Payer: Medicare Other | Admitting: Registered Nurse

## 2013-06-21 ENCOUNTER — Encounter (HOSPITAL_COMMUNITY)
Admission: RE | Disposition: A | Discharge status: Skilled Nursing Facility | Payer: Self-pay | Source: Ambulatory Visit | Attending: Orthopedic Surgery

## 2013-06-21 ENCOUNTER — Inpatient Hospital Stay (HOSPITAL_COMMUNITY)
Admission: RE | Admit: 2013-06-21 | Discharge: 2013-06-29 | DRG: 470 | Disposition: A | Discharge status: Skilled Nursing Facility | Payer: Medicare Other | Source: Ambulatory Visit | Attending: Orthopedic Surgery | Admitting: Orthopedic Surgery

## 2013-06-21 ENCOUNTER — Encounter (HOSPITAL_COMMUNITY): Payer: Self-pay | Admitting: *Deleted

## 2013-06-21 ENCOUNTER — Encounter (HOSPITAL_COMMUNITY): Payer: Medicare Other | Admitting: Registered Nurse

## 2013-06-21 DIAGNOSIS — Z7982 Long term (current) use of aspirin: Secondary | ICD-10-CM

## 2013-06-21 DIAGNOSIS — I10 Essential (primary) hypertension: Secondary | ICD-10-CM | POA: Diagnosis present

## 2013-06-21 DIAGNOSIS — Z8249 Family history of ischemic heart disease and other diseases of the circulatory system: Secondary | ICD-10-CM

## 2013-06-21 DIAGNOSIS — R4182 Altered mental status, unspecified: Secondary | ICD-10-CM | POA: Diagnosis not present

## 2013-06-21 DIAGNOSIS — Z96651 Presence of right artificial knee joint: Secondary | ICD-10-CM

## 2013-06-21 DIAGNOSIS — F411 Generalized anxiety disorder: Secondary | ICD-10-CM | POA: Diagnosis present

## 2013-06-21 DIAGNOSIS — E785 Hyperlipidemia, unspecified: Secondary | ICD-10-CM | POA: Diagnosis present

## 2013-06-21 DIAGNOSIS — M1710 Unilateral primary osteoarthritis, unspecified knee: Secondary | ICD-10-CM

## 2013-06-21 DIAGNOSIS — R609 Edema, unspecified: Secondary | ICD-10-CM | POA: Diagnosis present

## 2013-06-21 DIAGNOSIS — R112 Nausea with vomiting, unspecified: Secondary | ICD-10-CM | POA: Diagnosis not present

## 2013-06-21 DIAGNOSIS — E871 Hypo-osmolality and hyponatremia: Secondary | ICD-10-CM | POA: Diagnosis not present

## 2013-06-21 DIAGNOSIS — E870 Hyperosmolality and hypernatremia: Secondary | ICD-10-CM | POA: Diagnosis not present

## 2013-06-21 DIAGNOSIS — Z87891 Personal history of nicotine dependence: Secondary | ICD-10-CM

## 2013-06-21 DIAGNOSIS — E7800 Pure hypercholesterolemia, unspecified: Secondary | ICD-10-CM | POA: Diagnosis present

## 2013-06-21 DIAGNOSIS — R490 Dysphonia: Secondary | ICD-10-CM | POA: Diagnosis present

## 2013-06-21 DIAGNOSIS — A6000 Herpesviral infection of urogenital system, unspecified: Secondary | ICD-10-CM | POA: Diagnosis present

## 2013-06-21 DIAGNOSIS — R011 Cardiac murmur, unspecified: Secondary | ICD-10-CM | POA: Diagnosis present

## 2013-06-21 DIAGNOSIS — Z8673 Personal history of transient ischemic attack (TIA), and cerebral infarction without residual deficits: Secondary | ICD-10-CM

## 2013-06-21 DIAGNOSIS — T502X5A Adverse effect of carbonic-anhydrase inhibitors, benzothiadiazides and other diuretics, initial encounter: Secondary | ICD-10-CM | POA: Diagnosis present

## 2013-06-21 DIAGNOSIS — M179 Osteoarthritis of knee, unspecified: Secondary | ICD-10-CM

## 2013-06-21 DIAGNOSIS — H547 Unspecified visual loss: Secondary | ICD-10-CM | POA: Diagnosis present

## 2013-06-21 DIAGNOSIS — E869 Volume depletion, unspecified: Secondary | ICD-10-CM | POA: Diagnosis present

## 2013-06-21 DIAGNOSIS — Z8546 Personal history of malignant neoplasm of prostate: Secondary | ICD-10-CM

## 2013-06-21 DIAGNOSIS — K219 Gastro-esophageal reflux disease without esophagitis: Secondary | ICD-10-CM | POA: Diagnosis present

## 2013-06-21 DIAGNOSIS — G459 Transient cerebral ischemic attack, unspecified: Secondary | ICD-10-CM | POA: Diagnosis not present

## 2013-06-21 DIAGNOSIS — E876 Hypokalemia: Secondary | ICD-10-CM

## 2013-06-21 DIAGNOSIS — Z7902 Long term (current) use of antithrombotics/antiplatelets: Secondary | ICD-10-CM

## 2013-06-21 DIAGNOSIS — D62 Acute posthemorrhagic anemia: Secondary | ICD-10-CM | POA: Diagnosis not present

## 2013-06-21 DIAGNOSIS — Z79899 Other long term (current) drug therapy: Secondary | ICD-10-CM

## 2013-06-21 DIAGNOSIS — R4701 Aphasia: Secondary | ICD-10-CM | POA: Diagnosis not present

## 2013-06-21 HISTORY — DX: Unilateral primary osteoarthritis, unspecified knee: M17.10

## 2013-06-21 HISTORY — DX: Osteoarthritis of knee, unspecified: M17.9

## 2013-06-21 HISTORY — PX: TOTAL KNEE ARTHROPLASTY: SHX125

## 2013-06-21 LAB — TYPE AND SCREEN
ABO/RH(D): A POS
Antibody Screen: NEGATIVE

## 2013-06-21 SURGERY — ARTHROPLASTY, KNEE, TOTAL
Anesthesia: General | Site: Knee | Laterality: Right | Wound class: Clean

## 2013-06-21 MED ORDER — LIDOCAINE HCL (CARDIAC) 20 MG/ML IV SOLN
INTRAVENOUS | Status: DC | PRN
  Administered 2013-06-21: 50 mg via INTRAVENOUS

## 2013-06-21 MED ORDER — EPHEDRINE SULFATE 50 MG/ML IJ SOLN
INTRAMUSCULAR | Status: DC | PRN
  Administered 2013-06-21: 5 mg via INTRAVENOUS
  Administered 2013-06-21 (×2): 10 mg via INTRAVENOUS

## 2013-06-21 MED ORDER — VANCOMYCIN HCL IN DEXTROSE 1-5 GM/200ML-% IV SOLN
1000.0000 mg | Freq: Once | INTRAVENOUS | Status: AC
  Administered 2013-06-21: 1000 mg via INTRAVENOUS
  Filled 2013-06-21: qty 200

## 2013-06-21 MED ORDER — METOCLOPRAMIDE HCL 5 MG/ML IJ SOLN
5.0000 mg | Freq: Three times a day (TID) | INTRAMUSCULAR | Status: DC | PRN
  Administered 2013-06-25: 02:00:00 5 mg via INTRAVENOUS
  Filled 2013-06-21: qty 2

## 2013-06-21 MED ORDER — VANCOMYCIN HCL IN DEXTROSE 1-5 GM/200ML-% IV SOLN
1000.0000 mg | INTRAVENOUS | Status: AC
Start: 2013-06-22 — End: 2013-06-21
  Administered 2013-06-21: 1000 mg via INTRAVENOUS

## 2013-06-21 MED ORDER — DOCUSATE SODIUM 100 MG PO CAPS
100.0000 mg | ORAL_CAPSULE | Freq: Two times a day (BID) | ORAL | Status: DC
  Administered 2013-06-21 – 2013-06-29 (×14): 100 mg via ORAL
  Filled 2013-06-21 (×10): qty 1

## 2013-06-21 MED ORDER — ACETAMINOPHEN 650 MG RE SUPP: 650.0000 mg | Freq: Four times a day (QID) | RECTAL | Status: DC | PRN

## 2013-06-21 MED ORDER — KETOROLAC TROMETHAMINE 15 MG/ML IJ SOLN
7.5000 mg | Freq: Four times a day (QID) | INTRAMUSCULAR | Status: AC | PRN
  Administered 2013-06-21: 7.5 mg via INTRAVENOUS

## 2013-06-21 MED ORDER — DIPHENHYDRAMINE HCL 12.5 MG/5ML PO ELIX
12.5000 mg | ORAL_SOLUTION | ORAL | Status: DC | PRN
  Administered 2013-06-28: 12.5 mg via ORAL
  Filled 2013-06-21: qty 5

## 2013-06-21 MED ORDER — BUPIVACAINE HCL (PF) 0.25 % IJ SOLN
INTRAMUSCULAR | Status: AC
  Filled 2013-06-21: qty 30

## 2013-06-21 MED ORDER — HYDROMORPHONE HCL PF 1 MG/ML IJ SOLN
0.2500 mg | INTRAMUSCULAR | Status: DC | PRN
  Administered 2013-06-21 (×2): 0.5 mg via INTRAVENOUS

## 2013-06-21 MED ORDER — HYDROMORPHONE HCL PF 1 MG/ML IJ SOLN
INTRAMUSCULAR | Status: AC
  Filled 2013-06-21: qty 1

## 2013-06-21 MED ORDER — STERILE WATER FOR IRRIGATION IR SOLN
Status: DC | PRN
  Administered 2013-06-21: 3000 mL

## 2013-06-21 MED ORDER — SODIUM CHLORIDE 0.9 % IJ SOLN
INTRAMUSCULAR | Status: DC | PRN
  Administered 2013-06-21: 17:00:00

## 2013-06-21 MED ORDER — PHENYLEPHRINE HCL 10 MG/ML IJ SOLN
INTRAMUSCULAR | Status: DC | PRN
  Administered 2013-06-21: 40 ug via INTRAVENOUS
  Administered 2013-06-21: 80 ug via INTRAVENOUS

## 2013-06-21 MED ORDER — ACETAMINOPHEN 500 MG PO TABS
1000.0000 mg | ORAL_TABLET | Freq: Four times a day (QID) | ORAL | Status: AC
  Administered 2013-06-21 – 2013-06-22 (×4): 1000 mg via ORAL
  Filled 2013-06-21 (×4): qty 2

## 2013-06-21 MED ORDER — POLYETHYLENE GLYCOL 3350 17 G PO PACK
17.0000 g | PACK | Freq: Every day | ORAL | Status: DC | PRN
  Filled 2013-06-21: qty 1

## 2013-06-21 MED ORDER — OXYCODONE HCL 5 MG PO TABS
5.0000 mg | ORAL_TABLET | ORAL | Status: DC | PRN
  Administered 2013-06-22 – 2013-06-26 (×2): 5 mg via ORAL
  Filled 2013-06-21 (×2): qty 1

## 2013-06-21 MED ORDER — ATORVASTATIN CALCIUM 10 MG PO TABS
10.0000 mg | ORAL_TABLET | ORAL | Status: DC
  Administered 2013-06-23 – 2013-06-28 (×3): 10 mg via ORAL
  Filled 2013-06-21 (×3): qty 1

## 2013-06-21 MED ORDER — SODIUM CHLORIDE 0.9 % IJ SOLN
INTRAMUSCULAR | Status: AC
  Filled 2013-06-21: qty 50

## 2013-06-21 MED ORDER — ROCURONIUM BROMIDE 100 MG/10ML IV SOLN
INTRAVENOUS | Status: DC | PRN
  Administered 2013-06-21: 5 mg via INTRAVENOUS

## 2013-06-21 MED ORDER — BICALUTAMIDE 50 MG PO TABS
50.0000 mg | ORAL_TABLET | Freq: Every morning | ORAL | Status: DC
  Administered 2013-06-22 – 2013-06-29 (×8): 50 mg via ORAL
  Filled 2013-06-21 (×9): qty 1

## 2013-06-21 MED ORDER — METOCLOPRAMIDE HCL 10 MG PO TABS: 5.0000 mg | ORAL_TABLET | Freq: Three times a day (TID) | ORAL | Status: DC | PRN

## 2013-06-21 MED ORDER — DEXTROSE-NACL 5-0.9 % IV SOLN
INTRAVENOUS | Status: DC
  Administered 2013-06-22 – 2013-06-23 (×2): via INTRAVENOUS

## 2013-06-21 MED ORDER — CEFAZOLIN SODIUM 1-5 GM-% IV SOLN: 1.0000 g | Freq: Four times a day (QID) | INTRAVENOUS | Status: DC

## 2013-06-21 MED ORDER — PROPOFOL 10 MG/ML IV BOLUS
INTRAVENOUS | Status: DC | PRN
  Administered 2013-06-21 (×2): 100 mg via INTRAVENOUS
  Administered 2013-06-21: 150 mg via INTRAVENOUS

## 2013-06-21 MED ORDER — DEXAMETHASONE SODIUM PHOSPHATE 10 MG/ML IJ SOLN: 10.0000 mg | Freq: Once | INTRAMUSCULAR | Status: DC

## 2013-06-21 MED ORDER — MENTHOL 3 MG MT LOZG
1.0000 | LOZENGE | OROMUCOSAL | Status: DC | PRN
  Administered 2013-06-21: 3 mg via ORAL
  Filled 2013-06-21: qty 9

## 2013-06-21 MED ORDER — DEXAMETHASONE SODIUM PHOSPHATE 10 MG/ML IJ SOLN
10.0000 mg | Freq: Every day | INTRAMUSCULAR | Status: AC
  Filled 2013-06-21: qty 1

## 2013-06-21 MED ORDER — BUPIVACAINE HCL 0.25 % IJ SOLN
INTRAMUSCULAR | Status: DC | PRN
  Administered 2013-06-21: 20 mL

## 2013-06-21 MED ORDER — ONDANSETRON HCL 4 MG/2ML IJ SOLN
4.0000 mg | Freq: Four times a day (QID) | INTRAMUSCULAR | Status: DC | PRN
  Administered 2013-06-22 (×2): 4 mg via INTRAVENOUS
  Filled 2013-06-21 (×2): qty 2

## 2013-06-21 MED ORDER — METHOCARBAMOL 100 MG/ML IJ SOLN
500.0000 mg | Freq: Four times a day (QID) | INTRAVENOUS | Status: DC | PRN
  Administered 2013-06-21: 500 mg via INTRAVENOUS
  Filled 2013-06-21: qty 5

## 2013-06-21 MED ORDER — VANCOMYCIN HCL IN DEXTROSE 1-5 GM/200ML-% IV SOLN
INTRAVENOUS | Status: AC
  Filled 2013-06-21: qty 200

## 2013-06-21 MED ORDER — ONDANSETRON HCL 4 MG PO TABS: 4.0000 mg | ORAL_TABLET | Freq: Four times a day (QID) | ORAL | Status: DC | PRN

## 2013-06-21 MED ORDER — PHENOL 1.4 % MT LIQD: 1.0000 | OROMUCOSAL | Status: DC | PRN

## 2013-06-21 MED ORDER — METHOCARBAMOL 500 MG PO TABS: 500.0000 mg | ORAL_TABLET | Freq: Four times a day (QID) | ORAL | Status: DC | PRN

## 2013-06-21 MED ORDER — GLYCOPYRROLATE 0.2 MG/ML IJ SOLN
INTRAMUSCULAR | Status: DC | PRN
  Administered 2013-06-21: 0.2 mg via INTRAVENOUS

## 2013-06-21 MED ORDER — RIVAROXABAN 10 MG PO TABS
10.0000 mg | ORAL_TABLET | Freq: Every day | ORAL | Status: DC
  Administered 2013-06-22 – 2013-06-29 (×8): 10 mg via ORAL
  Filled 2013-06-21 (×10): qty 1

## 2013-06-21 MED ORDER — LACTATED RINGERS IV SOLN
INTRAVENOUS | Status: DC | PRN
  Administered 2013-06-21 (×3): via INTRAVENOUS

## 2013-06-21 MED ORDER — ONDANSETRON HCL 4 MG/2ML IJ SOLN
INTRAMUSCULAR | Status: DC | PRN
  Administered 2013-06-21: 4 mg via INTRAVENOUS

## 2013-06-21 MED ORDER — MORPHINE SULFATE 2 MG/ML IJ SOLN: 1.0000 mg | INTRAMUSCULAR | Status: DC | PRN

## 2013-06-21 MED ORDER — SUCCINYLCHOLINE CHLORIDE 20 MG/ML IJ SOLN
INTRAMUSCULAR | Status: DC | PRN
  Administered 2013-06-21: 100 mg via INTRAVENOUS

## 2013-06-21 MED ORDER — PROPOFOL INFUSION 10 MG/ML OPTIME
INTRAVENOUS | Status: DC | PRN
  Administered 2013-06-21: 50 ug/kg/min via INTRAVENOUS

## 2013-06-21 MED ORDER — KETOROLAC TROMETHAMINE 15 MG/ML IJ SOLN
INTRAMUSCULAR | Status: AC
  Filled 2013-06-21: qty 1

## 2013-06-21 MED ORDER — SODIUM CHLORIDE 0.9 % IR SOLN
Status: DC | PRN
  Administered 2013-06-21: 1000 mL

## 2013-06-21 MED ORDER — BUPIVACAINE LIPOSOME 1.3 % IJ SUSP
20.0000 mL | Freq: Once | INTRAMUSCULAR | Status: DC
  Filled 2013-06-21: qty 20

## 2013-06-21 MED ORDER — HYDROCHLOROTHIAZIDE 50 MG PO TABS
50.0000 mg | ORAL_TABLET | Freq: Every morning | ORAL | Status: DC
  Administered 2013-06-22 – 2013-06-24 (×3): 50 mg via ORAL
  Filled 2013-06-21 (×3): qty 1

## 2013-06-21 MED ORDER — DEXAMETHASONE SODIUM PHOSPHATE 10 MG/ML IJ SOLN
INTRAMUSCULAR | Status: DC | PRN
  Administered 2013-06-21: 10 mg via INTRAVENOUS

## 2013-06-21 MED ORDER — TRAMADOL HCL 50 MG PO TABS
50.0000 mg | ORAL_TABLET | Freq: Four times a day (QID) | ORAL | Status: DC | PRN
  Administered 2013-06-23 – 2013-06-24 (×2): 100 mg via ORAL
  Filled 2013-06-21 (×2): qty 2

## 2013-06-21 MED ORDER — LACTATED RINGERS IV SOLN
INTRAVENOUS | Status: DC
  Administered 2013-06-21: 1000 mL via INTRAVENOUS

## 2013-06-21 MED ORDER — FLEET ENEMA 7-19 GM/118ML RE ENEM: 1.0000 | ENEMA | Freq: Once | RECTAL | Status: AC | PRN

## 2013-06-21 MED ORDER — LORAZEPAM 0.5 MG PO TABS
0.5000 mg | ORAL_TABLET | Freq: Every day | ORAL | Status: DC
  Administered 2013-06-21 – 2013-06-25 (×4): 0.5 mg via ORAL
  Filled 2013-06-21 (×4): qty 1

## 2013-06-21 MED ORDER — ACETAMINOPHEN 500 MG PO TABS
1000.0000 mg | ORAL_TABLET | Freq: Once | ORAL | Status: AC
  Administered 2013-06-21: 1000 mg via ORAL
  Filled 2013-06-21: qty 2

## 2013-06-21 MED ORDER — SODIUM CHLORIDE 0.9 % IV SOLN: INTRAVENOUS | Status: DC

## 2013-06-21 MED ORDER — BISACODYL 10 MG RE SUPP: 10.0000 mg | Freq: Every day | RECTAL | Status: DC | PRN

## 2013-06-21 MED ORDER — ACYCLOVIR 200 MG PO CAPS
200.0000 mg | ORAL_CAPSULE | Freq: Every day | ORAL | Status: DC
  Administered 2013-06-22 – 2013-06-29 (×8): 200 mg via ORAL
  Filled 2013-06-21 (×8): qty 1

## 2013-06-21 MED ORDER — ACETAMINOPHEN 325 MG PO TABS
650.0000 mg | ORAL_TABLET | Freq: Four times a day (QID) | ORAL | Status: DC | PRN
  Filled 2013-06-21: qty 2

## 2013-06-21 MED ORDER — FENTANYL CITRATE 0.05 MG/ML IJ SOLN
INTRAMUSCULAR | Status: DC | PRN
  Administered 2013-06-21: 100 ug via INTRAVENOUS

## 2013-06-21 MED ORDER — DEXAMETHASONE 4 MG PO TABS
10.0000 mg | ORAL_TABLET | Freq: Every day | ORAL | Status: AC
  Administered 2013-06-22: 09:00:00 10 mg via ORAL
  Filled 2013-06-21: qty 1

## 2013-06-21 MED ORDER — FENTANYL CITRATE 0.05 MG/ML IJ SOLN: 25.0000 ug | INTRAMUSCULAR | Status: DC | PRN

## 2013-06-21 MED ORDER — AMLODIPINE BESYLATE 5 MG PO TABS
5.0000 mg | ORAL_TABLET | Freq: Every morning | ORAL | Status: DC
  Administered 2013-06-22 – 2013-06-25 (×4): 5 mg via ORAL
  Filled 2013-06-21 (×5): qty 1

## 2013-06-21 SURGICAL SUPPLY — 56 items
BAG ZIPLOCK 12X15 (MISCELLANEOUS) ×2 IMPLANT
BANDAGE ELASTIC 6 VELCRO ST LF (GAUZE/BANDAGES/DRESSINGS) ×2 IMPLANT
BANDAGE ESMARK 6X9 LF (GAUZE/BANDAGES/DRESSINGS) ×1 IMPLANT
BLADE SAG 18X100X1.27 (BLADE) ×2 IMPLANT
BLADE SAW SGTL 11.0X1.19X90.0M (BLADE) ×2 IMPLANT
BNDG ESMARK 6X9 LF (GAUZE/BANDAGES/DRESSINGS) ×2
BOWL SMART MIX CTS (DISPOSABLE) ×2 IMPLANT
CAPT RP KNEE ×2 IMPLANT
CEMENT HV SMART SET (Cement) ×4 IMPLANT
CLOSURE STERI-STRIP 1/4X4 (GAUZE/BANDAGES/DRESSINGS) ×2 IMPLANT
CLOTH BEACON ORANGE TIMEOUT ST (SAFETY) ×2 IMPLANT
CUFF TOURN SGL QUICK 34 (TOURNIQUET CUFF) ×1
CUFF TRNQT CYL 34X4X40X1 (TOURNIQUET CUFF) ×1 IMPLANT
DECANTER SPIKE VIAL GLASS SM (MISCELLANEOUS) ×2 IMPLANT
DRAPE EXTREMITY T 121X128X90 (DRAPE) ×2 IMPLANT
DRAPE POUCH INSTRU U-SHP 10X18 (DRAPES) ×2 IMPLANT
DRAPE U-SHAPE 47X51 STRL (DRAPES) ×2 IMPLANT
DRSG ADAPTIC 3X8 NADH LF (GAUZE/BANDAGES/DRESSINGS) ×2 IMPLANT
DRSG PAD ABDOMINAL 8X10 ST (GAUZE/BANDAGES/DRESSINGS) ×2 IMPLANT
DURAPREP 26ML APPLICATOR (WOUND CARE) ×2 IMPLANT
ELECT REM PT RETURN 9FT ADLT (ELECTROSURGICAL) ×2
ELECTRODE REM PT RTRN 9FT ADLT (ELECTROSURGICAL) ×1 IMPLANT
EVACUATOR 1/8 PVC DRAIN (DRAIN) ×2 IMPLANT
FACESHIELD LNG OPTICON STERILE (SAFETY) ×10 IMPLANT
GLOVE BIO SURGEON STRL SZ7.5 (GLOVE) IMPLANT
GLOVE BIO SURGEON STRL SZ8 (GLOVE) ×2 IMPLANT
GLOVE BIOGEL PI IND STRL 8 (GLOVE) ×2 IMPLANT
GLOVE BIOGEL PI INDICATOR 8 (GLOVE) ×2
GLOVE SURG SS PI 6.5 STRL IVOR (GLOVE) IMPLANT
GOWN PREVENTION PLUS LG XLONG (DISPOSABLE) ×2 IMPLANT
GOWN STRL REIN XL XLG (GOWN DISPOSABLE) IMPLANT
HANDPIECE INTERPULSE COAX TIP (DISPOSABLE) ×1
IMMOBILIZER KNEE 20 (SOFTGOODS) ×2
IMMOBILIZER KNEE 20 THIGH 36 (SOFTGOODS) ×1 IMPLANT
KIT BASIN OR (CUSTOM PROCEDURE TRAY) ×2 IMPLANT
MANIFOLD NEPTUNE II (INSTRUMENTS) ×2 IMPLANT
NDL SAFETY ECLIPSE 18X1.5 (NEEDLE) ×2 IMPLANT
NEEDLE HYPO 18GX1.5 SHARP (NEEDLE) ×2
NS IRRIG 1000ML POUR BTL (IV SOLUTION) ×2 IMPLANT
PACK TOTAL JOINT (CUSTOM PROCEDURE TRAY) ×2 IMPLANT
PADDING CAST COTTON 6X4 STRL (CAST SUPPLIES) ×4 IMPLANT
POSITIONER SURGICAL ARM (MISCELLANEOUS) ×2 IMPLANT
SET HNDPC FAN SPRY TIP SCT (DISPOSABLE) ×1 IMPLANT
SPONGE GAUZE 4X4 12PLY (GAUZE/BANDAGES/DRESSINGS) ×2 IMPLANT
STRIP CLOSURE SKIN 1/2X4 (GAUZE/BANDAGES/DRESSINGS) ×4 IMPLANT
SUCTION FRAZIER 12FR DISP (SUCTIONS) ×2 IMPLANT
SUT MNCRL AB 4-0 PS2 18 (SUTURE) ×2 IMPLANT
SUT VIC AB 2-0 CT1 27 (SUTURE) ×3
SUT VIC AB 2-0 CT1 TAPERPNT 27 (SUTURE) ×3 IMPLANT
SUT VLOC 180 0 24IN GS25 (SUTURE) ×2 IMPLANT
SYR 20CC LL (SYRINGE) ×2 IMPLANT
SYR 50ML LL SCALE MARK (SYRINGE) ×2 IMPLANT
TOWEL OR 17X26 10 PK STRL BLUE (TOWEL DISPOSABLE) ×4 IMPLANT
TRAY FOLEY CATH 14FRSI W/METER (CATHETERS) ×2 IMPLANT
WATER STERILE IRR 1500ML POUR (IV SOLUTION) ×2 IMPLANT
WRAP KNEE MAXI GEL POST OP (GAUZE/BANDAGES/DRESSINGS) ×2 IMPLANT

## 2013-06-21 NOTE — Anesthesia Postprocedure Evaluation (Signed)
  Anesthesia Post-op Note  Patient: Craig [name]  Procedure(s) Performed: Procedure(s) (LRB): RIGHT TOTAL KNEE ARTHROPLASTY (Right)  Patient Location: PACU  Anesthesia Type: General  Level of Consciousness: awake and alert   Airway and Oxygen Therapy: Patient Spontanous Breathing  Post-op Pain: mild  Post-op Assessment: Post-op Vital signs reviewed, Patient's Cardiovascular Status Stable, Respiratory Function Stable, Patent Airway and No signs of Nausea or vomiting  Last Vitals:  Filed Vitals:   06/21/13 1845  BP: 144/85  Pulse: 66  Temp: 36.6 C  Resp: 12    Post-op Vital Signs: stable   Complications: No apparent anesthesia complications

## 2013-06-21 NOTE — Interval H&P Note (Signed)
History and Physical Interval Note:  06/21/2013 3:46 PM  Linda Hedges  has presented today for surgery, with the diagnosis of OA OF RIGHT KNEE  The various methods of treatment have been discussed with the patient and family. After consideration of risks, benefits and other options for treatment, the patient has consented to  Procedure(s): RIGHT TOTAL KNEE ARTHROPLASTY (Right) as a surgical intervention .  The patient's history has been reviewed, patient examined, no change in status, stable for surgery.  I have reviewed the patient's chart and labs.  Questions were answered to the patient's satisfaction.     Loanne Drilling

## 2013-06-21 NOTE — Transfer of Care (Signed)
Immediate Anesthesia Transfer of Care Note  Patient: Craig [name]  Procedure(s) Performed: Procedure(s): RIGHT TOTAL KNEE ARTHROPLASTY (Right)  Patient Location: PACU  Anesthesia Type:General  Level of Consciousness: awake, alert , oriented and patient cooperative  Airway & Oxygen Therapy: Patient Spontanous Breathing and Patient connected to face mask oxygen  Post-op Assessment: Report given to PACU RN, Post -op Vital signs reviewed and stable and Patient moving all extremities X 4  Post vital signs: stable  Complications: No apparent anesthesia complications

## 2013-06-21 NOTE — Op Note (Signed)
Pre-operative diagnosis- Osteoarthritis  Right knee(s)  Post-operative diagnosis- Osteoarthritis Right knee(s)  Procedure-  Right  Total Knee Arthroplasty  Surgeon- Gus Rankin. Kwasi Joung, MD  Assistant- Avel Peace, PA-C   Anesthesia-  General EBL-* No blood loss amount entered *  Drains Hemovac  Tourniquet time-  Total Tourniquet Time Documented: Thigh (Right) - 34 minutes Total: Thigh (Right) - 34 minutes    Complications- None  Condition-PACU - hemodynamically stable.   Brief Clinical Note  Craig [name] is a 79 y.o. year old male with end stage OA of his right knee with progressively worsening pain and dysfunction. He has constant pain, with activity and at rest and significant functional deficits with difficulties even with ADLs. He has had extensive non-op management including analgesics, injections of cortisone and viscosupplements, and home exercise program, but remains in significant pain with significant dysfunction. Radiographs show bone on bone arthritis lateral and patellofemoral. He presents now for right Total Knee Arthroplasty.    Procedure in detail---   The patient is brought into the operating room and positioned supine on the operating table. After successful administration of  General,   a tourniquet is placed high on the  Right thigh(s) and the lower extremity is prepped and draped in the usual sterile fashion. Time out is performed by the operating team and then the  Right lower extremity is wrapped in Esmarch, knee flexed and the tourniquet inflated to 300 mmHg.       A midline incision is made with a ten blade through the subcutaneous tissue to the level of the extensor mechanism. A fresh blade is used to make a medial parapatellar arthrotomy. Soft tissue over the proximal medial tibia is subperiosteally elevated to the joint line with a knife and into the semimembranosus bursa with a Cobb elevator. Soft tissue over the proximal lateral tibia is elevated with  attention being paid to avoiding the patellar tendon on the tibial tubercle. The patella is everted, knee flexed 90 degrees and the ACL and PCL are removed. Findings are bone on bone all 3 compartments with large lateral and patellar osteophytes.        The drill is used to create a starting hole in the distal femur and the canal is thoroughly irrigated with sterile saline to remove the fatty contents. The 5 degree Right  valgus alignment guide is placed into the femoral canal and the distal femoral cutting block is pinned to remove 10 mm off the distal femur. Resection is made with an oscillating saw.      The tibia is subluxed forward and the menisci are removed. The extramedullary alignment guide is placed referencing proximally at the medial aspect of the tibial tubercle and distally along the second metatarsal axis and tibial crest. The block is pinned to remove 2mm off the more deficient lateral  side. Resection is made with an oscillating saw. Size 4is the most appropriate size for the tibia and the proximal tibia is prepared with the modular drill and keel punch for that size.      The femoral sizing guide is placed and size 5 is most appropriate. Rotation is marked off the epicondylar axis and confirmed by creating a rectangular flexion gap at 90 degrees. The size 5 cutting block is pinned in this rotation and the anterior, posterior and chamfer cuts are made with the oscillating saw. The intercondylar block is then placed and that cut is made.      Trial size 4 tibial component, trial  size 5 posterior stabilized femur and a 10  mm posterior stabilized rotating platform insert trial is placed. Full extension is achieved with excellent varus/valgus and anterior/posterior balance throughout full range of motion. The patella is everted and thickness measured to be 22  mm. Free hand resection is taken to 12 mm, a 35 template is placed, lug holes are drilled, trial patella is placed, and it tracks normally.  Osteophytes are removed off the posterior femur with the trial in place. All trials are removed and the cut bone surfaces prepared with pulsatile lavage. Cement is mixed and once ready for implantation, the size 4 tibial implant, size  5 posterior stabilized femoral component, and the size 35 patella are cemented in place and the patella is held with the clamp. The trial insert is placed and the knee held in full extension. The Exparel (20 ml mixed with 30 ml saline) and .25% Bupivicaine, are injected into the extensor mechanism, posterior capsule, medial and lateral gutters and subcutaneous tissues.  All extruded cement is removed and once the cement is hard the permanent 10 mm posterior stabilized rotating platform insert is placed into the tibial tray.      The wound is copiously irrigated with saline solution and the extensor mechanism closed over a hemovac drain with #1 PDS suture. The tourniquet is released for a total tourniquet time of 34  minutes. Flexion against gravity is 135 degrees and the patella tracks normally. Subcutaneous tissue is closed with 2.0 vicryl and subcuticular with running 4.0 Monocryl. The incision is cleaned and dried and steri-strips and a bulky sterile dressing are applied. The limb is placed into a knee immobilizer and the patient is awakened and transported to recovery in stable condition.      Please note that a surgical assistant was a medical necessity for this procedure in order to perform it in a safe and expeditious manner. Surgical assistant was necessary to retract the ligaments and vital neurovascular structures to prevent injury to them and also necessary for proper positioning of the limb to allow for anatomic placement of the prosthesis.   Gus Rankin Craig Jutras, MD    06/21/2013, 5:22 PM

## 2013-06-21 NOTE — H&P (View-Only) (Signed)
Craig [name]  DOB: 03/03/1926 Divorced / Language: English / Race: White Male  Date of Admission:  06/21/2013  Chief Complaint;  Right Knee Pain  History of Present Illness The patient is a 79 year old male who comes in for a preoperative History and Physical. The patient is scheduled for a right total knee arthroplasty to be performed by Dr. Frank V. Aluisio, MD at [facility] on 06/21/2013. The patient is a 79 year old male who presents for follow up of their knee. The patient is being followed for their right knee pain. They are now several month(s) out from aspiration and injection. Symptoms reported today include: pain. The patient feels that they are doing poorly. The following medication has been used for pain control: none. Note for "Follow-up Knee": He feels like he is ready for surgery. It is getting progressively more difficult for him to do activities he desires. The injections are no longer helping him. He is having pain throughout the day as well as some discomfort at night. He is ready to proceed with knee replacement surgery. They have been treated conservatively in the past for the above stated problem and despite conservative measures, they continue to have progressive pain and severe functional limitations and dysfunction. They have failed non-operative management including home exercise, medications. It is felt that they would benefit from undergoing total joint replacement. Risks and benefits of the procedure have been discussed with the patient and they elect to proceed with surgery. There are no active contraindications to surgery such as ongoing infection or rapidly progressive neurological disease.   Problem List Osteoarthritis, knee (715.96)   Allergies PENICILLIN    Family History Kidney disease. father Hypertension. First Degree Relatives. mother and father    Social History Pain Contract. no Exercise. Exercises  weekly; does running / walking Alcohol use. current drinker; drinks hard liquor; only occasionally per week Tobacco use. Former smoker. former smoker; smoke(d) 2 1/2 pack(s) per day Living situation. Lives alone. Post-Surgical Plans. Plans to go to [facility] unit. Advance Directives. Living Will, Healthcare POA    Medication History Atorvastatin Calcium (10MG Tablet, Oral) Active. Hydrochlorothiazide (50MG Tablet, Oral) Active. Lisinopril (40MG Tablet, Oral) Active. Plavix (75MG Tablet, Oral) Active. Acyclovir (200MG Capsule, Oral) Active. AmLODIPine Besylate (5MG Tablet, Oral) Active. Bicalutamide (50MG Tablet, Oral) Active. LORazepam (0.5MG Tablet, Oral) Active.    Past Surgical History Cataract Surgery. bilateral Colon Polyp Removal - Colonoscopy Hemorrhoidectomy Straighten Nasal Septum Tonsillectomy Mastoidectomy. bilateral Prostatectomy; Transurethral    Medical History Osteoarthritis Prostate Cancer Hypercholesterolemia Gastroesophageal Reflux Disease High blood pressure Prostate Disease Cervicalgia (723.1). 08/11/2004 Lumbago (724.2). 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 01/11/2005 Anxiety Disorder Impaired Vision Cataract Bronchitis Hemorrhoids Diverticulosis Mumps TIA (transient ischemic attack) (435.9)   Review of Systems General:Present- Night Sweats. Not Present- Chills, Fever, Fatigue, Weight Gain, Weight Loss and Memory Loss. Skin:Not Present- Hives, Itching, Rash, Eczema and Lesions. HEENT:Not Present- Tinnitus, Headache, Double Vision, Visual Loss, Hearing Loss and Dentures. Respiratory:Not Present- Shortness of breath with exertion, Shortness of breath at rest, Allergies, Coughing up blood and Chronic Cough. Cardiovascular:Not Present- Chest Pain, Racing/skipping heartbeats, Difficulty Breathing Lying Down, Murmur, Swelling and Palpitations. Gastrointestinal:Not Present- Bloody  Stool, Heartburn, Abdominal Pain, Vomiting, Nausea, Constipation, Diarrhea, Difficulty Swallowing, Jaundice and Loss of appetitie. Male Genitourinary:Present- Urinary frequency and Urgency. Not Present- Blood in Urine, Weak urinary stream, Discharge, Flank Pain, Incontinence, Painful Urination, Urinary Retention and Urinating at Night. Musculoskeletal:Present- Joint Pain and Back Pain. Not   Present- Muscle Weakness, Muscle Pain, Joint Swelling, Morning Stiffness and Spasms. Neurological:Not Present- Tremor, Dizziness, Blackout spells, Paralysis, Difficulty with balance and Weakness. Psychiatric:Not Present- Insomnia.    Vitals Pulse: 64 (Regular) Resp.: 16 (Unlabored) BP: 148/84 (Sitting, Right Arm, Standard)     Physical Exam The physical exam findings are as follows:  Note: Patient is an 79 year old male with continued knee pain. Patient is accompanied today by his daughter [name].   General Mental Status - Alert, cooperative and good historian. General Appearance- pleasant. Not in acute distress. Orientation- Oriented X3. Build & Nutrition- Well nourished and Well developed.   Head and Neck Head- normocephalic, atraumatic . Neck Global Assessment- supple. no bruit auscultated on the right and no bruit auscultated on the left.  upper dentures  Eye Vision- Wears corrective lenses. Pupil- Bilateral- Regular and Round. Motion- Bilateral- EOMI.   Chest and Lung Exam Auscultation: Breath sounds:- clear at anterior chest wall and - clear at posterior chest wall. Adventitious sounds:- No Adventitious sounds.   Cardiovascular Auscultation:Rhythm- Regular rate and rhythm. Heart Sounds- S1 WNL and S2 WNL. Murmurs & Other Heart Sounds:Auscultation of the heart reveals - No Murmurs.   Abdomen Palpation/Percussion:Tenderness- Abdomen is non-tender to palpation. Rigidity (guarding)- Abdomen is soft. Auscultation:Auscultation of the  abdomen reveals - Bowel sounds normal.   Male Genitourinary Not done, not pertinent to present illness  Musculoskeletal On exam well developed male alert and oriented in no apparent distress. Evaluation of his right knee shows slight valgus. His range of motion is about 5 to 125. He is tender lateral greater than medial with no instability. His left knee exam is normal.  RADIOGRAPHS: We reviewed his radiographs from earlier this year and he has essentially bone on bone in the lateral compartment and patellofemoral compartment also.   Assessment & Plan Osteoarthritis, knee (715.96)  Note: Plan is for a Right Total Knee Replacement by Dr. Aluisio.  Plan is to go to [facility].  PCP - Dr. Ronald Roberts - Patient has been seen preoperatively and felt to be stable for surgery. Urology - Dr. Stephen Dahlstadt Oncology - Dr. Frias Shadar  The patient will not receive TXA (tranexamic acid) due to: Bladder Cancer  Signed electronically by Linton Stolp L Krista Godsil, III PA-C 

## 2013-06-21 NOTE — Anesthesia Preprocedure Evaluation (Addendum)
Anesthesia Evaluation  Patient identified by MRN, date of birth, ID band Patient awake    Reviewed: Allergy & Precautions, H&P , NPO status , Patient's Chart, lab work & pertinent test results  Airway Mallampati: II TM Distance: >3 FB Neck ROM: Full    Dental no notable dental hx.    Pulmonary former smoker,  breath sounds clear to auscultation  Pulmonary exam normal       Cardiovascular Exercise Tolerance: Good hypertension, Pt. on medications negative cardio ROS  Rhythm:Regular Rate:Normal  Most recent ECG and CXR reviewed.  SB 50 with PACs.     Neuro/Psych negative neurological ROS  negative psych ROS   GI/Hepatic negative GI ROS, Neg liver ROS,   Endo/Other  negative endocrine ROS  Renal/GU negative Renal ROS  negative genitourinary   Musculoskeletal negative musculoskeletal ROS (+)   Abdominal   Peds negative pediatric ROS (+)  Hematology negative hematology ROS (+)   Anesthesia Other Findings   Reproductive/Obstetrics negative OB ROS                           Anesthesia Physical Anesthesia Plan  ASA: II  Anesthesia Plan: General   Post-op Pain Management:    Induction: Intravenous  Airway Management Planned: Oral ETT  Additional Equipment:   Intra-op Plan:   Post-operative Plan: Extubation in OR  Informed Consent: I have reviewed the patients History and Physical, chart, labs and discussed the procedure including the risks, benefits and alternatives for the proposed anesthesia with the patient or authorized representative who has indicated his/her understanding and acceptance.   Dental advisory given  Plan Discussed with: CRNA  Anesthesia Plan Comments: (Plavix 6 days ago. Recommendation is at least 7 days. Plan general. Patient consents.)       Anesthesia Quick Evaluation

## 2013-06-22 ENCOUNTER — Encounter (HOSPITAL_COMMUNITY): Payer: Self-pay | Admitting: Orthopedic Surgery

## 2013-06-22 DIAGNOSIS — D62 Acute posthemorrhagic anemia: Secondary | ICD-10-CM

## 2013-06-22 DIAGNOSIS — E871 Hypo-osmolality and hyponatremia: Secondary | ICD-10-CM | POA: Diagnosis present

## 2013-06-22 HISTORY — DX: Acute posthemorrhagic anemia: D62

## 2013-06-22 LAB — BASIC METABOLIC PANEL
Calcium: 8.4 mg/dL (ref 8.4–10.5)
Creatinine, Ser: 0.82 mg/dL (ref 0.50–1.35)
GFR calc Af Amer: >90 mL/min (ref >90)
GFR calc non Af Amer: 79 mL/min — ABNORMAL LOW (ref >90)
Sodium: 127 mEq/L — ABNORMAL LOW (ref 135–145)

## 2013-06-22 LAB — CBC
MCH: 35 pg — ABNORMAL HIGH (ref 26.0–34.0)
MCHC: 36.7 g/dL — ABNORMAL HIGH (ref 30.0–36.0)
MCV: 95.3 fL (ref 78.0–100.0)
Platelets: 158 10*3/uL (ref 150–400)
RBC: 3 MIL/uL — ABNORMAL LOW (ref 4.22–5.81)

## 2013-06-22 MED ORDER — ALUM & MAG HYDROXIDE-SIMETH 200-200-20 MG/5ML PO SUSP
30.0000 mL | Freq: Four times a day (QID) | ORAL | Status: DC | PRN
  Administered 2013-06-22 – 2013-06-24 (×3): 30 mL via ORAL
  Filled 2013-06-22 (×4): qty 30

## 2013-06-22 MED ORDER — SODIUM CHLORIDE 0.9 % IV BOLUS (SEPSIS)
250.0000 mL | Freq: Once | INTRAVENOUS | Status: AC
  Administered 2013-06-22: 250 mL via INTRAVENOUS

## 2013-06-22 NOTE — Progress Notes (Signed)
Utilization review completed.  

## 2013-06-22 NOTE — Progress Notes (Signed)
OT Cancellation Note  Patient Details Name: RAHM MINIX MRN: [id number] DOB: 1926-02-23   Cancelled Treatment:    Reason Eval/Treat Not Completed: Other (comment) Defer OT to SNF.  Lennox Laity [phone] 06/22/2013, 12:18 PM

## 2013-06-22 NOTE — Progress Notes (Signed)
   Subjective: 1 Day Post-Op Procedure(s) (LRB): RIGHT TOTAL KNEE ARTHROPLASTY (Right) Patient reports pain as mild.   Patient seen in rounds with Dr. Lequita Halt. His biggest complaint was no sleep last night. Patient is well, and has had no acute complaints or problems We will start therapy today.  Plan is to go to the Rehab Unit at [facility] after hospital stay.  Objective: Vital signs in last 24 hours: Temp:  [97.4 F (36.3 C)-98.6 F (37 C)] 98.6 F (37 C) (10/28 0553) Pulse Rate:  [58-68] 59 (10/28 0901) Resp:  [12-18] 18 (10/28 0901) BP: (99-167)/(67-94) 115/74 mmHg (10/28 0901) SpO2:  [96 %-100 %] 97 % (10/28 0901) Weight:  [74.8 kg (164 lb 14.5 oz)] 74.8 kg (164 lb 14.5 oz) (10/27 2100)  Intake/Output from previous day:  Intake/Output Summary (Last 24 hours) at 06/22/13 0913 Last data filed at 06/22/13 0600  Gross per 24 hour  Intake 3411.67 ml  Output   1645 ml  Net 1766.67 ml    Labs:  Recent Labs  06/22/13 0415  HGB 10.5*    Recent Labs  06/22/13 0415  WBC 16.0*  RBC 3.00*  HCT 28.6*  PLT 158    Recent Labs  06/22/13 0415  NA 127*  K 3.6  CL 90*  CO2 27  BUN 17  CREATININE 0.82  GLUCOSE 169*  CALCIUM 8.4   No results found for this basename: LABPT, INR,  in the last 72 hours  EXAM General - Patient is Alert, Appropriate and Oriented Extremity - Neurovascular intact Sensation intact distally Dorsiflexion/Plantar flexion intact Dressing - dressing C/D/I Motor Function - intact, moving foot and toes well on exam.  Hemovac pulled without difficulty.  Past Medical History  Diagnosis Date  . Hypertension   . Degenerative disc disease   . Degenerative joint disease   . Genital herpes   . Diverticulitis   . Prostate cancer     Assessment/Plan: 1 Day Post-Op Procedure(s) (LRB): RIGHT TOTAL KNEE ARTHROPLASTY (Right) Principal Problem:   OA (osteoarthritis) of knee Active Problems:   Postoperative anemia due to acute blood  loss   Hyponatremia  Estimated body mass index is 23.66 kg/(m^2) as calculated from the following:   Height as of this encounter: 5\' 10"  (1.778 m).   Weight as of this encounter: 74.8 kg (164 lb 14.5 oz). Up with therapy Discharge to SNF - [facility]  DVT Prophylaxis - Xarelto Weight-Bearing as tolerated to right leg D/C O2 and Pulse OX and try on Room [address]  Patrica Duel 06/22/2013, 9:13 AM

## 2013-06-22 NOTE — Evaluation (Signed)
Physical Therapy Evaluation Patient Details Name: Craig [name] MRN: [id number] DOB: November 09, 1925 Today's Date: 06/22/2013 Time: 4098-1191 PT Time Calculation (min): 23 min  PT Assessment / Plan / Recommendation History of Present Illness  RTKA   Clinical Impression  Pt tolerated ambulation x 20' with c/o "wooziness". BP 119/73. Pt will benefit from PT to address problems listed. Pt plans SNF.     PT Assessment  Patient needs continued PT services    Follow Up Recommendations  SNF    Does the patient have the potential to tolerate intense rehabilitation      Barriers to Discharge        Equipment Recommendations  None recommended by PT    Recommendations for Other Services     Frequency 7X/week    Precautions / Restrictions Precautions Precautions: Fall;Knee Required Braces or Orthoses: Knee Immobilizer - Right   Pertinent Vitals/Pain Pt reports pain is 4 R knee.       Mobility  Bed Mobility Bed Mobility: Supine to Sit Supine to Sit: 3: Mod assist Details for Bed Mobility Assistance: cues for technique, assist for supporting leg to lower to the floor. Transfers Transfers: Sit to Stand;Stand to Sit Sit to Stand: 3: Mod assist;From bed;With upper extremity assist Stand to Sit: To chair/3-in-1;With upper extremity assist;With armrests Details for Transfer Assistance: multimodal cues for UE use, proper and safe  use of RW. Ambulation/Gait Ambulation/Gait Assistance: 1: +2 Total assist Ambulation/Gait: Patient Percentage: 20% Ambulation Distance (Feet): 20 Feet Assistive device: Rolling walker Ambulation/Gait Assistance Details: cues for sequence, posture, look ahead. safe use of RW. Gait Pattern: Step-to pattern;Antalgic;Trunk flexed Gait velocity: decreased.    Exercises Total Joint Exercises Ankle Circles/Pumps: AROM;Both;10 reps Quad Sets: AROM;Right;10 reps;Supine Heel Slides: AAROM;Right;10 reps;Supine Hip ABduction/ADduction: AAROM;Right;10  reps;Supine Straight Leg Raises: AAROM;Right;10 reps;Supine Goniometric ROM: 10-40   PT Diagnosis: Difficulty walking;Acute pain  PT Problem List: Decreased strength;Decreased range of motion;Decreased activity tolerance;Decreased mobility;Decreased knowledge of precautions;Decreased safety awareness;Decreased knowledge of use of DME PT Treatment Interventions: Gait training;DME instruction;Functional mobility training;Therapeutic activities;Therapeutic exercise;Patient/family education     PT Goals(Current goals can be found in the care plan section) Acute Rehab PT Goals Patient Stated Goal: I want to walk without pain. PT Goal Formulation: With patient/family Time For Goal Achievement: 06/29/13 Potential to Achieve Goals: Good  Visit Information  Last PT Received On: 06/22/13 Assistance Needed: +2 History of Present Illness: RTKA        Prior Functioning  Home Living Family/patient expects to be discharged to:: Skilled nursing facility Prior Function Level of Independence: Independent with assistive device(s) Communication Communication: No difficulties    Cognition  Cognition Arousal/Alertness: Awake/alert Behavior During Therapy: WFL for tasks assessed/performed Overall Cognitive Status: Within Functional Limits for tasks assessed    Extremity/Trunk Assessment Upper Extremity Assessment Upper Extremity Assessment: Overall WFL for tasks assessed Lower Extremity Assessment Lower Extremity Assessment: RLE deficits/detail RLE Deficits / Details: able to perform 1 SLR, knee flexion=45   Balance    End of Session PT - End of Session Equipment Utilized During Treatment: Gait belt Activity Tolerance: Treatment limited secondary to medical complications (Comment) (c/o feeling "woozy") Patient left: in chair;with family/visitor present  GP     Rada Hay 06/22/2013, 1:59 PM Blanchard Kelch PT [phone]

## 2013-06-22 NOTE — Plan of Care (Signed)
Problem: Consults Goal: Diagnosis- Total Joint Replacement Right Total Knee Replacement     

## 2013-06-22 NOTE — Progress Notes (Signed)
Clinical Social Work Department BRIEF PSYCHOSOCIAL ASSESSMENT 06/22/2013  Patient:  Craig [name], Craig [name]     Account Number:  [id number]     Admit date:  06/21/2013  Clinical Social Worker:  Candie Chroman  Date/Time:  06/22/2013 01:12 PM  Referred by:  Physician  Date Referred:  06/22/2013 Referred for  SNF Placement   Other Referral:   Interview type:  Patient Other interview type:    PSYCHOSOCIAL DATA Living Status:  FACILITY Admitted from facility:  [facility] Level of care:  Independent Living Primary support name:  Rosanne Gutting Primary support relationship to patient:  CHILD, ADULT Degree of support available:   supportive    CURRENT CONCERNS Current Concerns  Post-Acute Placement   Other Concerns:    SOCIAL WORK ASSESSMENT / PLAN Pt is an 79 yr old gentleman living at home prior to hospitalization. CSW met with pt to assist with d/c planning. Pt plans to have ST Rehab at [facility] following hospital d/c. CSW has contacted SNF and d/c plan has been confirmed. CSW will follow to assist with d/c planning back to [facility] when stable.   Assessment/plan status:  Psychosocial Support/Ongoing Assessment of Needs Other assessment/ plan:   Information/referral to community resources:   None needed at this time.    PATIENT'S/FAMILY'S RESPONSE TO PLAN OF CARE: Pt is looking forward to having rehab at [facility].   Cori Razor LCSW [phone]

## 2013-06-22 NOTE — Progress Notes (Signed)
Physical Therapy Treatment Patient Details Name: ARIUS HARNOIS MRN: [id number] DOB: 07/23/1926 Today's Date: 06/22/2013 Time: 4098-1191 PT Time Calculation (min): 25 min  PT Assessment / Plan / Recommendation  History of Present Illness RTKA with post op nausea   PT Comments   POD # 1 pm session.  Assisted pt out of recliner to amb in hallway however amb distance limited due to max c/o nausea.  Chair brought to pt.  Assisted back to bed for CPM.    Follow Up Recommendations  SNF     Does the patient have the potential to tolerate intense rehabilitation     Barriers to Discharge        Equipment Recommendations  None recommended by PT    Recommendations for Other Services    Frequency 7X/week   Progress towards PT Goals    Plan      Precautions / Restrictions Precautions Precautions: Fall;Knee Precaution Comments: Instructed pt on KI use for amb Required Braces or Orthoses: Knee Immobilizer - Right Restrictions Weight Bearing Restrictions: No Other Position/Activity Restrictions: WBAT    Pertinent Vitals/Pain C/o MAX nausea    Mobility  Bed Mobility Bed Mobility: Sit to Supine Supine to Sit: 3: Mod assist Sit to Supine: 1: +2 Total assist Sit to Supine: Patient Percentage: 50% Details for Bed Mobility Assistance: + 2 assist to transition pt back to bed due to MAX c/o fatigue and nausea. Transfers Transfers: Sit to Stand;Stand to Sit Sit to Stand: From chair/3-in-1;1: +2 Total assist Sit to Stand: Patient Percentage: 50% Stand to Sit: 1: +2 Total assist;To bed Stand to Sit: Patient Percentage: 50% Details for Transfer Assistance: multimodal cues for UE use, proper and safe  use of RW. Ambulation/Gait Ambulation/Gait Assistance: 1: +2 Total assist Ambulation/Gait: Patient Percentage: 50% Ambulation Distance (Feet): 16 Feet Assistive device: Rolling walker Ambulation/Gait Assistance Details: 75% VC's on proper sequencing, proper walker to self distance and  upright posture.  Amb distance limited by MAX c/o nausea.  Chair brought to pt.   Gait Pattern: Step-to pattern;Antalgic;Trunk flexed Gait velocity: decreased.         PT Goals (current goals can now be found in the care plan section) Acute Rehab PT Goals Patient Stated Goal: I want to walk without pain. PT Goal Formulation: With patient/family Time For Goal Achievement: 06/29/13 Potential to Achieve Goals: Good  Visit Information  Last PT Received On: 06/22/13 Assistance Needed: +2 History of Present Illness: RTKA with post op nausea    Subjective Data  Patient Stated Goal: I want to walk without pain.   Cognition  Cognition Arousal/Alertness: Awake/alert Behavior During Therapy: WFL for tasks assessed/performed Overall Cognitive Status: Within Functional Limits for tasks assessed    Balance     End of Session PT - End of Session Equipment Utilized During Treatment: Gait belt Activity Tolerance: Treatment limited secondary to medical complications (Comment) (c/o feeling "woozy") Patient left: in chair;with family/visitor present CPM Right Knee CPM Right Knee: On   [name]  PTA [facility]  Acute  Rehab Pager      [phone]

## 2013-06-23 LAB — CBC
Hemoglobin: 8.3 g/dL — ABNORMAL LOW (ref 13.0–17.0)
MCH: 35.3 pg — ABNORMAL HIGH (ref 26.0–34.0)
MCHC: 37.7 g/dL — ABNORMAL HIGH (ref 30.0–36.0)
MCV: 93.6 fL (ref 78.0–100.0)

## 2013-06-23 LAB — BASIC METABOLIC PANEL
Calcium: 8.3 mg/dL — ABNORMAL LOW (ref 8.4–10.5)
Chloride: 88 mEq/L — ABNORMAL LOW (ref 96–112)
GFR calc Af Amer: >90 mL/min (ref >90)
GFR calc non Af Amer: 79 mL/min — ABNORMAL LOW (ref >90)
Glucose, Bld: 115 mg/dL — ABNORMAL HIGH (ref 70–99)
Sodium: 123 mEq/L — ABNORMAL LOW (ref 135–145)

## 2013-06-23 MED ORDER — DIAZEPAM 5 MG PO TABS
5.0000 mg | ORAL_TABLET | Freq: Once | ORAL | Status: AC
  Administered 2013-06-23: 5 mg via ORAL
  Filled 2013-06-23: qty 1

## 2013-06-23 MED ORDER — ACETAMINOPHEN 325 MG PO TABS
650.0000 mg | ORAL_TABLET | Freq: Once | ORAL | Status: AC
  Administered 2013-06-23: 650 mg via ORAL
  Filled 2013-06-23: qty 2

## 2013-06-23 NOTE — Progress Notes (Signed)
Pt with change in ms per 1st shift. Pt also with hiccups. Paged md on call awaiting call back.

## 2013-06-23 NOTE — Progress Notes (Signed)
Physical Therapy Treatment Patient Details Name: Craig [name] MRN: [id number] DOB: 16-Sep-1925 Today's Date: 06/23/2013 Time: 1135-1203 PT Time Calculation (min): 28 min  PT Assessment / Plan / Recommendation  History of Present Illness RTKA with post op nausea/anemia   PT Comments   POD # 2 am session.  Pt OOB in recliner. Amb limited distance due to c/o feeling "woozy".  Noted HgB 8.3  Performed TKR TE's Applied ICE   Follow Up Recommendations  SNF     Does the patient have the potential to tolerate intense rehabilitation     Barriers to Discharge        Equipment Recommendations       Recommendations for Other Services    Frequency 7X/week   Progress towards PT Goals Progress towards PT goals: Progressing toward goals  Plan      Precautions / Restrictions Precautions Precautions: Fall;Knee Precaution Comments: Instructed pt and spouse on KI use for amb Required Braces or Orthoses: Knee Immobilizer - Right Restrictions Weight Bearing Restrictions: No Other Position/Activity Restrictions: WBAT    Pertinent Vitals/Pain C/o 5/10 knee pain with TE"S ICE applied    Mobility  Bed Mobility Bed Mobility: Not assessed Details for Bed Mobility Assistance: Pt OOB in recliner Transfers Transfers: Sit to Stand;Stand to Sit Sit to Stand: 1: +2 Total assist;3: Mod assist;2: Max assist Stand to Sit: 3: Mod assist Details for Transfer Assistance: Initial posterior lean and required increased time to center self.  25% VC's on proper tech of hand placement and 50% VC's to extend R LE prior to sit.  Ambulation/Gait Ambulation/Gait Assistance: 3: Mod assist Ambulation Distance (Feet): 12 Feet Assistive device: Rolling walker Ambulation/Gait Assistance Details: 50% Vc's on proper walker to self distance and upright posture.  Limited amb distance due to c/o feeling "woozy".  Noted HgB 8.3      Gait Pattern: Step-to pattern;Antalgic;Trunk flexed Gait velocity: decreased.     Exercises   Total Knee Replacement TE's 10 reps B LE ankle pumps 10 reps knee presses 10 reps heel slides  10 reps SAQ's 10 reps SLR's 10 reps ABD Followed by ICE    PT Goals (current goals can now be found in the care plan section)    Visit Information  Last PT Received On: 06/23/13 Assistance Needed: +1 History of Present Illness: RTKA with post op nausea/anemia    Subjective Data      Cognition       Balance     End of Session PT - End of Session Equipment Utilized During Treatment: Gait belt Activity Tolerance: Patient limited by fatigue Patient left: in chair;with family/visitor present   Craig [name]  PTA [facility]  Acute  Rehab Pager      [phone]

## 2013-06-23 NOTE — Progress Notes (Signed)
Physical Therapy Treatment Patient Details Name: Craig [name] MRN: [id number] DOB: 1925-12-18 Today's Date: 06/23/2013 Time: 4098-1191 PT Time Calculation (min): 25 min  PT Assessment / Plan / Recommendation  History of Present Illness RTKA with post op nausea/anemia   PT Comments   POD # 2 pm session.  Amb a limited distance then assisted pt back to bed for CPM.  Increased c/o fatigue. Awaiting blood transfusion.   Follow Up Recommendations  SNF     Does the patient have the potential to tolerate intense rehabilitation     Barriers to Discharge        Equipment Recommendations       Recommendations for Other Services    Frequency 7X/week   Progress towards PT Goals Progress towards PT goals: Progressing toward goals  Plan      Precautions / Restrictions Precautions Precautions: Fall;Knee Precaution Comments: Instructed pt and spouse on KI use for amb Required Braces or Orthoses: Knee Immobilizer - Right Restrictions Weight Bearing Restrictions: No Other Position/Activity Restrictions: WBAT    Pertinent Vitals/Pain C/o 4/10 knee pain ICE applied    Mobility  Bed Mobility Bed Mobility: Sit to Supine Sit to Supine: 2: Max assist Details for Bed Mobility Assistance: Max assist 2nd MAX c/o fatigue Transfers Transfers: Sit to Stand;Stand to Sit Sit to Stand: 2: Max assist;From chair/3-in-1 Stand to Sit: 2: Max assist Details for Transfer Assistance: extra assist needed 2nd increased c/o fatigue this afternoon Ambulation/Gait Ambulation/Gait Assistance: 3: Mod assist;2: Max assist Ambulation Distance (Feet): 18 Feet Assistive device: Rolling walker Ambulation/Gait Assistance Details: 50% VC's to increase posture and proper R LE placement.  Assisted from chair to bed. Gait Pattern: Step-to pattern;Antalgic;Trunk flexed Gait velocity: decreased.    PT Goals (current goals can now be found in the care plan section)    Visit Information  Last PT Received On:  06/23/13 Assistance Needed: +1 History of Present Illness: RTKA with post op nausea/anemia    Subjective Data      Cognition       Balance     End of Session PT - End of Session Equipment Utilized During Treatment: Gait belt Activity Tolerance: Patient limited by fatigue Patient left: in bed;with call bell/phone within reach;with family/visitor present   [name]  PTA [facility]  Acute  Rehab Pager      [phone]

## 2013-06-23 NOTE — Progress Notes (Signed)
   Subjective: 2 Days Post-Op Procedure(s) (LRB): RIGHT TOTAL KNEE ARTHROPLASTY (Right) Patient reports pain as mild and moderate.   Patient seen in rounds with Dr. Lequita Halt. Patient is well, and has had no acute complaints or problems Plan is to go Rehab at [facility] after hospital stay.  Objective: Vital signs in last 24 hours: Temp:  [97.9 F (36.6 C)-98.7 F (37.1 C)] 98 F (36.7 C) (10/29 0630) Pulse Rate:  [58-121] 121 (10/29 0630) Resp:  [18] 18 (10/29 0630) BP: (112-117)/(67-72) 117/72 mmHg (10/29 0630) SpO2:  [92 %-95 %] 95 % (10/29 0630)  Intake/Output from previous day:  Intake/Output Summary (Last 24 hours) at 06/23/13 1313 Last data filed at 06/23/13 0931  Gross per 24 hour  Intake   1840 ml  Output   1400 ml  Net    440 ml    Intake/Output this shift: Total I/O In: 360 [P.O.:360] Out: -   Labs:  Recent Labs  06/22/13 0415 06/23/13 0457  HGB 10.5* 8.3*    Recent Labs  06/22/13 0415 06/23/13 0457  WBC 16.0* 16.3*  RBC 3.00* 2.35*  HCT 28.6* 22.0*  PLT 158 138*    Recent Labs  06/22/13 0415 06/23/13 0457  NA 127* 123*  K 3.6 3.2*  CL 90* 88*  CO2 27 27  BUN 17 12  CREATININE 0.82 0.81  GLUCOSE 169* 115*  CALCIUM 8.4 8.3*   No results found for this basename: LABPT, INR,  in the last 72 hours  EXAM General - Patient is Alert, Appropriate and Oriented Extremity - Neurovascular intact Sensation intact distally Dorsiflexion/Plantar flexion intact Dressing/Incision - clean, dry, no drainage, healing Motor Function - intact, moving foot and toes well on exam.   Past Medical History  Diagnosis Date  . Hypertension   . Degenerative disc disease   . Degenerative joint disease   . Genital herpes   . Diverticulitis   . Prostate cancer     Assessment/Plan: 2 Days Post-Op Procedure(s) (LRB): RIGHT TOTAL KNEE ARTHROPLASTY (Right) Principal Problem:   OA (osteoarthritis) of knee Active Problems:   Postoperative anemia due  to acute blood loss   Hyponatremia  Estimated body mass index is 23.66 kg/(m^2) as calculated from the following:   Height as of this encounter: 5\' 10"  (1.778 m).   Weight as of this encounter: 74.8 kg (164 lb 14.5 oz). Up with therapy Discharge to SNF Blood today and recheck labs in the morning.  DVT Prophylaxis - Xarelto Weight-Bearing as tolerated to right leg  PERKINS, ALEXZANDREW 06/23/2013, 1:13 PM

## 2013-06-24 ENCOUNTER — Inpatient Hospital Stay (HOSPITAL_COMMUNITY): Payer: Medicare Other

## 2013-06-24 ENCOUNTER — Encounter (HOSPITAL_COMMUNITY): Payer: Self-pay | Admitting: *Deleted

## 2013-06-24 DIAGNOSIS — G459 Transient cerebral ischemic attack, unspecified: Secondary | ICD-10-CM | POA: Diagnosis present

## 2013-06-24 DIAGNOSIS — R490 Dysphonia: Secondary | ICD-10-CM | POA: Diagnosis not present

## 2013-06-24 DIAGNOSIS — E871 Hypo-osmolality and hyponatremia: Secondary | ICD-10-CM

## 2013-06-24 DIAGNOSIS — E876 Hypokalemia: Secondary | ICD-10-CM

## 2013-06-24 LAB — CBC
Hemoglobin: 9.6 g/dL — ABNORMAL LOW (ref 13.0–17.0)
MCH: 32.8 pg (ref 26.0–34.0)
MCV: 89.4 fL (ref 78.0–100.0)
Platelets: 121 10*3/uL — ABNORMAL LOW (ref 150–400)
RBC: 2.93 MIL/uL — ABNORMAL LOW (ref 4.22–5.81)
RDW: 14.5 % (ref 11.5–15.5)

## 2013-06-24 LAB — BASIC METABOLIC PANEL
BUN: 10 mg/dL (ref 6–23)
BUN: 9 mg/dL (ref 6–23)
BUN: 8 mg/dL (ref 6–23)
CO2: 28 mEq/L (ref 19–32)
Calcium: 8.2 mg/dL — ABNORMAL LOW (ref 8.4–10.5)
Calcium: 7.6 mg/dL — ABNORMAL LOW (ref 8.4–10.5)
Chloride: 84 mEq/L — ABNORMAL LOW (ref 96–112)
Chloride: 79 mEq/L — ABNORMAL LOW (ref 96–112)
Creatinine, Ser: 0.73 mg/dL (ref 0.50–1.35)
Creatinine, Ser: 0.56 mg/dL (ref 0.50–1.35)
GFR calc Af Amer: >90 mL/min (ref >90)
GFR calc Af Amer: >90 mL/min (ref >90)
GFR calc non Af Amer: 85 mL/min — ABNORMAL LOW (ref >90)
Glucose, Bld: 111 mg/dL — ABNORMAL HIGH (ref 70–99)
Potassium: 2.9 mEq/L — ABNORMAL LOW (ref 3.5–5.1)
Potassium: 3.1 mEq/L — ABNORMAL LOW (ref 3.5–5.1)
Potassium: 3.4 mEq/L — ABNORMAL LOW (ref 3.5–5.1)
Sodium: 120 mEq/L — ABNORMAL LOW (ref 135–145)
Sodium: 114 mEq/L — CL (ref 135–145)
Sodium: 114 mEq/L — CL (ref 135–145)

## 2013-06-24 LAB — TYPE AND SCREEN
Antibody Screen: NEGATIVE
Unit division: 0
Unit division: 0

## 2013-06-24 LAB — COMPREHENSIVE METABOLIC PANEL
ALT: 12 U/L (ref 0–53)
Alkaline Phosphatase: 46 U/L (ref 39–117)
BUN: 10 mg/dL (ref 6–23)
CO2: 28 mEq/L (ref 19–32)
Calcium: 7.9 mg/dL — ABNORMAL LOW (ref 8.4–10.5)
GFR calc Af Amer: >90 mL/min (ref >90)
GFR calc non Af Amer: 81 mL/min — ABNORMAL LOW (ref >90)
Glucose, Bld: 107 mg/dL — ABNORMAL HIGH (ref 70–99)
Potassium: 3.1 mEq/L — ABNORMAL LOW (ref 3.5–5.1)
Sodium: 115 mEq/L — CL (ref 135–145)

## 2013-06-24 LAB — HEMOGLOBIN A1C: Hgb A1c MFr Bld: 5.5 % (ref <5.7)

## 2013-06-24 LAB — LIPID PANEL
HDL: 62 mg/dL (ref >39)
Total CHOL/HDL Ratio: 2 RATIO
VLDL: 14 mg/dL (ref 0–40)

## 2013-06-24 MED ORDER — TOLVAPTAN 15 MG PO TABS
15.0000 mg | ORAL_TABLET | ORAL | Status: AC
  Administered 2013-06-25: 04:00:00 15 mg via ORAL
  Filled 2013-06-24: qty 1

## 2013-06-24 MED ORDER — ASPIRIN EC 81 MG PO TBEC: 40.5000 mg | DELAYED_RELEASE_TABLET | Freq: Every morning | ORAL | Status: DC

## 2013-06-24 MED ORDER — POTASSIUM CHLORIDE CRYS ER 20 MEQ PO TBCR
40.0000 meq | EXTENDED_RELEASE_TABLET | Freq: Three times a day (TID) | ORAL | Status: DC
  Administered 2013-06-24: 40 meq via ORAL
  Filled 2013-06-24 (×3): qty 2

## 2013-06-24 MED ORDER — SODIUM CHLORIDE 0.9 % IV SOLN
INTRAVENOUS | Status: DC
  Administered 2013-06-24: 19:00:00 via INTRAVENOUS

## 2013-06-24 MED ORDER — POTASSIUM CHLORIDE 10 MEQ/100ML IV SOLN
10.0000 meq | INTRAVENOUS | Status: AC
  Administered 2013-06-24 – 2013-06-25 (×3): 10 meq via INTRAVENOUS
  Filled 2013-06-24 (×3): qty 100

## 2013-06-24 MED ORDER — ASPIRIN 81 MG PO CHEW
40.5000 mg | CHEWABLE_TABLET | Freq: Every day | ORAL | Status: DC
  Administered 2013-06-24 – 2013-06-25 (×2): 40.5 mg via ORAL
  Administered 2013-06-26: 11:00:00 via ORAL
  Administered 2013-06-27 – 2013-06-29 (×3): 40.5 mg via ORAL
  Filled 2013-06-24 (×6): qty 0.5

## 2013-06-24 NOTE — Progress Notes (Signed)
Received a critical lab value of Na 114 from the floor.  Neurology is uncomfortable with management of this and recommended Medicine to handle the electrolyte abnormalities. We have called the Medicine service earlier at 2:30, 6:00, and now again just spoke with Dr. Vanessa Barbara. He recommended I call the Renal service which I have done. I spoke with Dr. Kathrene Bongo who will consult on patient as well. Just updated the floor and Dr. Vanessa Barbara was seeing the patient currently.

## 2013-06-24 NOTE — Care Management Note (Signed)
    Page 1 of 1   06/24/2013     4:49:19 PM   CARE MANAGEMENT NOTE 06/24/2013  Patient:  Craig [name], Craig [name]   Account Number:  [id number]  Date Initiated:  06/23/2013  Documentation initiated by:  Colleen Can  Subjective/Objective Assessment:   dx rt total knee replacemnt     Action/Plan:   SNF rehab   Anticipated DC Date:  06/24/2013   Anticipated DC Plan:  SKILLED NURSING FACILITY  In-house referral  Clinical Social Worker      DC Planning Services  CM consult      Choice offered to / List presented to:             Status of service:  Completed, signed off Medicare Important Message given?   (If response is "NO", the following Medicare IM given date fields will be blank) Date Medicare IM given:   Date Additional Medicare IM given:    Discharge Disposition:    Per UR Regulation:    If discussed at Long Length of Stay Meetings, dates discussed:    Comments:

## 2013-06-24 NOTE — Progress Notes (Signed)
CSW assisting with d/c planning. D/C on hold for today due to medical issues. [facility] has been notified. SNF will have bed available [facility] if pt is stable for d/c.  Cori Razor LCSW [phone]

## 2013-06-24 NOTE — Progress Notes (Signed)
CRITICAL VALUE ALERT  Critical value received: NA 114  Date of notification:  06/24/13  Time of notification:  2005  Critical value read back: Yes  Nurse who received alert:  Aquilla Hacker  MD notified (1st page):  Lenny Pastel  Time of first page: 2007  MD notified (2nd page): Ralene Bathe PA  Time of second page:2030  Responding MD: Ralene Bathe   Time MD responded:  2035

## 2013-06-24 NOTE — Progress Notes (Signed)
Pt.'s girlfriend called to report  pt. having difficulty speaking. I checked pt. At 6:30PM & found some words could be understood & some couldn't. Arm & leg strength, grips were equal & no other deficits other than difficulty with speech were noted. [phone] was called x3 in the next hour & 10 min.with no return call . Pt's speech was clear by 6:40PM.Night nurse Jeanella Anton was notified of these events as was the supervisor Georgette Shell.

## 2013-06-24 NOTE — Progress Notes (Signed)
Late entry.  Informed that patient had episode of vomiting during pt session. Informed pt and family to notify nursing staff if pt has another episode and not to dispose of until .

## 2013-06-24 NOTE — Progress Notes (Signed)
Subjective: 3 Days Post-Op Procedure(s) (LRB): RIGHT TOTAL KNEE ARTHROPLASTY (Right) Patient reports pain as mild.   Patient seen in rounds with Dr. Lequita Halt. Patient is doing okay this morning but did have an issue last night. Yesterday, the nurse was notified that the patient was having some issues with speech by the patient's girlfriend.  The RN assessed the patient and found him to have trouble with words which was also witnessed by significant other in room. The whole episode last about 20 minutes as per the staff.  The only change in the patient that was observed was his speech.  This was relayed to Dr. Lequita Halt this morning on rounds.  He was evaluated and found to be grossly intact to his faculties.  He reminded Dr. Lequita Halt that he had a TIA within the past month while at lunch with friends.  He states that he saw Dr. Anne Hahn.  He was taking Plavix and Aspirin prior to surgery.  He was placed on Xarelto for DVT prophylaxis and is scheduled to resume to the Plavix and ASA therapy the day following completion of the Xarelto. Due to the episode described from yesterday evening, Dr. Lequita Halt requested a Neuro Consult here in the hospital prior to releasing the patient.  He is scheduled to go the the Rehab Unit at [facility] where he is a resident. Plan is to go Rehab at [facility] after hospital stay.   Objective: Vital signs in last 24 hours: Temp:  [97.7 F (36.5 C)-98.9 F (37.2 C)] 98.1 F (36.7 C) (10/30 0600) Pulse Rate:  [52-105] 65 (10/30 0600) Resp:  [16-20] 18 (10/30 0600) BP: (87-178)/(54-87) 178/65 mmHg (10/30 0600) SpO2:  [92 %-96 %] 96 % (10/30 0600)  Intake/Output from previous day:  Intake/Output Summary (Last 24 hours) at 06/24/13 0837 Last data filed at 06/24/13 0759  Gross per 24 hour  Intake   1850 ml  Output   2000 ml  Net   -150 ml    Intake/Output this shift: Total I/O In: 240 [P.O.:240] Out: 200 [Urine:200]  Labs:  Recent Labs   06/22/13 0415 06/23/13 0457 06/24/13 0458  HGB 10.5* 8.3* 9.6*    Recent Labs  06/23/13 0457 06/24/13 0458  WBC 16.3* 10.2  RBC 2.35* 2.93*  HCT 22.0* 26.2*  PLT 138* 121*    Recent Labs  06/23/13 0457 06/24/13 0458  NA 123* 120*  K 3.2* 2.9*  CL 88* 84*  CO2 27 28  BUN 12 10  CREATININE 0.81 0.73  GLUCOSE 115* 111*  CALCIUM 8.3* 8.0*   No results found for this basename: LABPT, INR,  in the last 72 hours  EXAM General - Patient is Alert, Appropriate and Oriented Neurological exam reveals alert, oriented, normal speech, no focal findings or movement disorder noted, neck supple without rigidity, cranial nerves II through XII intact, motor and sensory grossly normal bilaterally, normal muscle tone, no tremors, strength 5/5.Neurologic Exam Extremity - Neurovascular intact Sensation intact distally Dorsiflexion/Plantar flexion intact Dressing/Incision - clean, dry, no drainage, healing Motor Function - intact, moving foot and toes well on exam.   Past Medical History  Diagnosis Date  . Hypertension   . Degenerative disc disease   . Degenerative joint disease   . Genital herpes   . Diverticulitis   . Prostate cancer     Assessment/Plan: 3 Days Post-Op Procedure(s) (LRB): RIGHT TOTAL KNEE ARTHROPLASTY (Right) Principal Problem:   OA (osteoarthritis) of knee Active Problems:   Postoperative anemia due  to acute blood loss   Hyponatremia   Dysphonia, Transient  Estimated body mass index is 23.66 kg/(m^2) as calculated from the following:   Height as of this encounter: 5\' 10"  (1.778 m).   Weight as of this encounter: 74.8 kg (164 lb 14.5 oz). Up with therapy  NEURO CONSULT - patient states that he was seen by Dr. Lesia Sago in the past. Spoke with Dr. Anne Hahn this morning and will call the [facility] for a consult.  DVT Prophylaxis - Xarelto Weight-Bearing as tolerated to right leg  Setsuko Robins 06/24/2013, 8:37 AM     ` `

## 2013-06-24 NOTE — Progress Notes (Signed)
Received critical value from lab for NA of 114. Paged [facility] with Lenny Pastel calling back. He told me [facility] was not following this patient. Paged Ralene Bathe with [facility]. She called back and is to put in orders for a consult with [facility]. Will continue monitor pt.

## 2013-06-24 NOTE — Consult Note (Addendum)
[facility] Medical Consultation  Craig [name] YQM:[id number] DOB: [birth year] DOA: 06/21/2013 PCP: Lorenda Peck, MD   Requesting physician: Dr Lequita Halt Date of consultation: 10/30 Reason for consultation: hyponatremia and hypokalemia  Impression/Recommendations Principal Problem: Hyponatremia -severe, has trended down from 127 on 10/28 to 115 today -likely due to volume depletion and HCTZ(50MG )- pt was on this through today- have dc'ed - he is alert and oriented at this time so for now will start hydration with NS and monitor Bmet Q4 X3 for now, follow and if not improving or new symptoms will recommend transfer to unit and renal consultation -obtain urine lytes(though will not be very helpful if elevated as pt has been on diuretic) -will also obtain TSH, cortisol and follow -likely etiology of N/V Hypokalemia  Likely due to diuretic, replace k Dysphonia, Transient  - Neuro Consulted per primary team OA (osteoarthritis) of knee -per primary team, s/p surgery Postoperative anemia due to acute blood loss -hgb stable at 9.6 today      [facility] will followup again tomorrow. Please contact me if I can be of assistance in the meanwhile. Thank you for this consultation.  Chief Complaint: low sodium  HPI:  The pt is an 79yo with h/o HTN on HCTZ 50mg , DJD, prostate cancer admitted to Ortho service on 10/27 for R. TKA and his sodium which was 127 on 10/28 has been dropping to 115 today and also hypokalemia at 3.1 and medicine consulted for management of electrolyte abnormalities.Per family at bedside pt had no PO intake for a long time preop and has had decreased PO intake since the surgery. He admits to nausea for the past few days, and then vomited today x 1 so far. Pt denies dairrhea,  It is reported that last pm he had transient speech difficulty and AMS>> Neuro consulted per primay team. Pt is alert, oriented, appropriate and with no speech difficulty today. No seizures  reported.   Review of Systems:  The patient denies  fever, weight loss,, vision loss, decreased hearing, hoarseness, chest pain, syncope, dyspnea on exertion, peripheral edema, balance deficits, hemoptysis, abdominal pain, melena, hematochezia, severe indigestion/heartburn, hematuria, incontinence, genital sores, muscle weakness, suspicious skin lesions, transient blindness, difficulty walking, depression, unusual weight change, abnormal bleeding.  Past Medical History  Diagnosis Date  . Hypertension   . Degenerative disc disease   . Degenerative joint disease   . Genital herpes   . Diverticulitis   . Prostate cancer    Past Surgical History  Procedure Laterality Date  . Total knee arthroplasty Right 06/21/2013    Procedure: RIGHT TOTAL KNEE ARTHROPLASTY;  Surgeon: Loanne Drilling, MD;  Location: [facility] ORS;  Service: Orthopedics;  Laterality: Right;   Social History:  reports that he quit smoking about 29 years ago. He does not have any smokeless tobacco history on file. He reports that he drinks about 1.2 ounces of alcohol per week. His drug history is not on file.  Allergies  Allergen Reactions  . Penicillins Other (See Comments)    unknown   History reviewed. No pertinent family history.  Prior to Admission medications   Medication Sig Start Date End Date Taking? Authorizing Provider  acyclovir (ZOVIRAX) 200 MG capsule Take 200 mg by mouth daily.  08/27/11  Yes Historical Provider, MD  amLODipine (NORVASC) 5 MG tablet Take 5 mg by mouth every morning.    Yes Historical Provider, MD  atorvastatin (LIPITOR) 10 MG tablet Take 10 mg by mouth every Monday, Wednesday, and Friday.  04/23/12  Yes Historical Provider, MD  denosumab (XGEVA) 120 MG/1.7ML SOLN injection Inject 120 mg into the skin every 30 (thirty) days.   Yes Historical Provider, MD  doxycycline (VIBRAMYCIN) 100 MG capsule Take 100 mg by mouth 2 (two) times daily.   Yes Historical Provider, MD  hydrochlorothiazide (HYDRODIURIL)  50 MG tablet Take 50 mg by mouth every morning.  08/27/11  Yes Historical Provider, MD  leuprolide (LUPRON) 30 MG injection Inject 30 mg into the muscle every 30 (thirty) days.   Yes Historical Provider, MD  LORazepam (ATIVAN) 0.5 MG tablet Take 0.5 mg by mouth at bedtime.  04/29/13  Yes Historical Provider, MD  aspirin EC 81 MG tablet Take 40.5 mg by mouth every morning.    Historical Provider, MD  B Complex-C (SUPER B COMPLEX PO) Take 1 capsule by mouth 2 (two) times daily.     Historical Provider, MD  bicalutamide (CASODEX) 50 MG tablet Take 50 mg by mouth every morning.  01/05/13   Historical Provider, MD  Calcium Carbonate-Vitamin D (CALCIUM 600 + D PO) Take 1 tablet by mouth 2 (two) times daily.    Historical Provider, MD  clopidogrel (PLAVIX) 75 MG tablet Take 75 mg by mouth every morning.  02/02/13   Historical Provider, MD  lisinopril (PRINIVIL,ZESTRIL) 40 MG tablet Take 40 mg by mouth every morning.  08/27/11   Historical Provider, MD   Physical Exam: Blood pressure 106/69, pulse 68, temperature 98.1 F (36.7 C), temperature source Oral, resp. rate 18, height 5\' 10"  (1.778 m), weight 74.8 kg (164 lb 14.5 oz), SpO2 95.00%. Filed Vitals:   06/24/13 1348  BP: 106/69  Pulse: 68  Temp: 98.1 F (36.7 C)  Resp: 18    Constitutional: Vital signs reviewed.  Patient is a well-developed and well-nourished  in no acute distress and cooperative with exam. Alert and oriented x3.  Head: Normocephalic and atraumatic Mouth: no erythema or exudates, dry MM Eyes: PERRL, EOMI, conjunctivae normal, No scleral icterus.  Neck: Supple, Trachea midline normal ROM, No JVD, mass, thyromegaly, or carotid bruit present.  Cardiovascular: RRR, S1 normal, S2 normal, no MRG, pulses symmetric and intact bilaterally Pulmonary/Chest: normal respiratory effort, CTAB, no wheezes, rales, or rhonchi Abdominal: Soft. Non-tender, non-distended, bowel sounds are normal, no masses, organomegaly, or guarding present.  GU: no CVA  tenderness Extremities: RLE mildy edematous, LLE no edema; no cyanosis Hematology: no cervical, inginal, or axillary adenopathy.  Neurological: A&O x3, Strength is normal, cranial nerve II-XII are grossly intact, no focal motor deficit, sensory intact to light touch bilaterally.  Skin: Warm, dry and intact. No rash, cyanosis, or clubbing.  Psychiatric: Normal mood and affect. speech and behavior is normal.      Labs on Admission:  Basic Metabolic Panel:  Recent Labs Lab 06/22/13 0415 06/23/13 0457 06/24/13 0458 06/24/13 1320  NA 127* 123* 120* 115*  K 3.6 3.2* 2.9* 3.1*  CL 90* 88* 84* 80*  CO2 27 27 28 28   GLUCOSE 169* 115* 111* 107*  BUN 17 12 10 10   CREATININE 0.82 0.81 0.73 0.77  CALCIUM 8.4 8.3* 8.0* 7.9*   Liver Function Tests:  Recent Labs Lab 06/24/13 1320  AST 20  ALT 12  ALKPHOS 46  BILITOT 0.9  PROT 5.3*  ALBUMIN 3.0*   No results found for this basename: LIPASE, AMYLASE,  in the last 168 hours No results found for this basename: AMMONIA,  in the last 168 hours CBC:  Recent Labs Lab 06/22/13 0415 06/23/13 0457  06/24/13 0458  WBC 16.0* 16.3* 10.2  HGB 10.5* 8.3* 9.6*  HCT 28.6* 22.0* 26.2*  MCV 95.3 93.6 89.4  PLT 158 138* 121*   Cardiac Enzymes: No results found for this basename: CKTOTAL, CKMB, CKMBINDEX, TROPONINI,  in the last 168 hours BNP: No components found with this basename: POCBNP,  CBG: No results found for this basename: GLUCAP,  in the last 168 hours  Radiological Exams on Admission: Mr Brain Wo Contrast  06/24/2013   CLINICAL DATA:  Post knee replacement. Episode of altered mental status with difficulty expressing himself. Question stroke  EXAM: MRI HEAD WITHOUT CONTRAST  TECHNIQUE: Multiplanar, multisequence MR imaging was performed. No intravenous contrast was administered.  COMPARISON:  11/23/2010 CT.  FINDINGS: No acute infarct.  Mild to moderate small vessel disease type changes.  No intracranial hemorrhage.  Global atrophy  without hydrocephalus.  No intracranial mass lesion noted on this unenhanced exam.  Major intracranial vascular structures are patent.  Mild cervical spondylotic changes upper cervical spine.  IMPRESSION: No acute infarct. Please see above.   Electronically Signed   By: Bridgett Larsson M.D.   On: 06/24/2013 15:42     Time spent: >14mis  Kela Millin [facility] Pager [phone]  If 7PM-7AM, please contact night-coverage [email or web address] Password [email or web address] 06/24/2013, 6:30 PM

## 2013-06-24 NOTE — Consult Note (Signed)
Referring Physician: Aluisio    Chief Complaint: Transient AMS and aphasia  HPI:                                                                                                                                         Craig [name] is an 79 y.o. male recently underwent a right TKA. Prior to surgery he was on ASA and Plavix . This was stopped 7 days prior to surgery.  Patient underwent surgery 06-21-13.  ON that date his sodium was 127 and potassium was 3.6.  Last night he was noted to have a period of AMS and difficulty expressing himself.  Patient does not recall the event well but does recall he was having some difficulty expressing himself. This event lasted for about 10-20 minutes per wife.  He apparently had had a similar episode about 5 weeks ago and was seen by Dr. Su Hilt. Looking into the lab results his sodium had dropped from 127 to 120 from over the last few days and potassium has also dropped from 3.6 to 2.9.  Currently he is awake and oriented following all commands.   Date last known well: Date: 06/23/2013 Time last known well: Time: 18:30 tPA Given: No: recent suregery  Past Medical History  Diagnosis Date  . Hypertension   . Degenerative disc disease   . Degenerative joint disease   . Genital herpes   . Diverticulitis   . Prostate cancer     Past Surgical History  Procedure Laterality Date  . Total knee arthroplasty Right 06/21/2013    Procedure: RIGHT TOTAL KNEE ARTHROPLASTY;  Surgeon: Loanne Drilling, MD;  Location: [facility];  Service: Orthopedics;  Laterality: Right;    History reviewed. No pertinent family history. Social History:  reports that he quit smoking about 29 years ago. He does not have any smokeless tobacco history on file. He reports that he drinks about 1.2 ounces of alcohol per week. His drug history is not on file.  Allergies:  Allergies  Allergen Reactions  . Penicillins Other (See Comments)    unknown    Medications:                                                                                                                            Scheduled: . acyclovir  200 mg Oral Daily  . amLODipine  5 mg Oral q morning - 10a  . aspirin  40.5 mg Oral Daily  . atorvastatin  10 mg Oral Q M,W,F  . bicalutamide  50 mg Oral q morning - 10a  . docusate sodium  100 mg Oral BID  . hydrochlorothiazide  50 mg Oral q morning - 10a  . LORazepam  0.5 mg Oral QHS  . rivaroxaban  10 mg Oral Q breakfast    ROS:                                                                                                                                       History obtained from the patient  General ROS: negative for - chills, fatigue, fever, night sweats, weight gain or weight loss Psychological ROS: negative for - behavioral disorder, hallucinations, memory difficulties, mood swings or suicidal ideation Ophthalmic ROS: negative for - blurry vision, double vision, eye pain or loss of vision ENT ROS: negative for - epistaxis, nasal discharge, oral lesions, sore throat, tinnitus or vertigo Allergy and Immunology ROS: negative for - hives or itchy/watery eyes Hematological and Lymphatic ROS: negative for - bleeding problems, bruising or swollen lymph nodes Endocrine ROS: negative for - galactorrhea, hair pattern changes, polydipsia/polyuria or temperature intolerance Respiratory ROS: negative for - cough, hemoptysis, shortness of breath or wheezing Cardiovascular ROS: negative for - chest pain, dyspnea on exertion, edema or irregular heartbeat Gastrointestinal ROS: negative for - abdominal pain, diarrhea, hematemesis, nausea/vomiting or stool incontinence Genito-Urinary ROS: negative for - dysuria, hematuria, incontinence or urinary frequency/urgency Musculoskeletal ROS: negative for - joint swelling or muscular weakness Neurological ROS: as noted in HPI Dermatological ROS: negative for rash and skin lesion changes  Neurologic Examination:                                                                                                       Blood pressure 135/78, pulse 65, temperature 98.3 F (36.8 C), temperature source Oral, resp. rate 18, height 5\' 10"  (1.778 m), weight 74.8 kg (164 lb 14.5 oz), SpO2 96.00%.   Mental Status: Alert, oriented, thought content appropriate.  Speech fluent without evidence of aphasia.  Able to follow 3 step commands without difficulty. Cranial Nerves: II: Discs flat bilaterally; Visual fields grossly normal, pupils equal, round, reactive to light and accommodation III,IV, VI: ptosis not present, extra-ocular motions intact bilaterally V,VII: smile symmetric, facial light touch sensation normal bilaterally VIII: hearing normal bilaterally IX,X: gag reflex present XI: bilateral shoulder shrug XII: midline tongue  extension without atrophy or fasciculations  Motor: Right : Upper extremity   5/5    Left:     Upper extremity   5/5  Lower extremity   5/5     Lower extremity   In splint but shows proximal 3/5 and distal 5/5 Tone and bulk:normal tone throughout; no atrophy noted Sensory: Pinprick and light touch intact throughout, bilaterally Deep Tendon Reflexes:  Right: Upper Extremity   Left: Upper extremity  1+ throughout  Plantars: Right: downgoing   Left: downgoing Cerebellar: normal finger-to-nose,   CV: pulses palpable throughout    Results for orders placed during the hospital encounter of 06/21/13 (from the past 48 hour(s))  CBC     Status: Abnormal   Collection Time    06/23/13  4:57 AM      Result Value Range   WBC 16.3 (*) 4.0 - 10.5 K/uL   RBC 2.35 (*) 4.22 - 5.81 MIL/uL   Hemoglobin 8.3 (*) 13.0 - 17.0 g/dL   Comment: REPEATED TO VERIFY     DELTA CHECK NOTED   HCT 22.0 (*) 39.0 - 52.0 %   MCV 93.6  78.0 - 100.0 fL   MCH 35.3 (*) 26.0 - 34.0 pg   MCHC 37.7 (*) 30.0 - 36.0 g/dL   Comment: RULED OUT INTERFERING SUBSTANCES   RDW 12.3  11.5 - 15.5 %   Platelets 138 (*) 150 - 400 K/uL  BASIC  METABOLIC PANEL     Status: Abnormal   Collection Time    06/23/13  4:57 AM      Result Value Range   Sodium 123 (*) 135 - 145 mEq/L   Potassium 3.2 (*) 3.5 - 5.1 mEq/L   Chloride 88 (*) 96 - 112 mEq/L   CO2 27  19 - 32 mEq/L   Glucose, Bld 115 (*) 70 - 99 mg/dL   BUN 12  6 - 23 mg/dL   Creatinine, Ser 1.61  0.50 - 1.35 mg/dL   Calcium 8.3 (*) 8.4 - 10.5 mg/dL   GFR calc non Af Amer 79 (*) >90 mL/min   GFR calc Af Amer >90  >90 mL/min   Comment: (NOTE)     The eGFR has been calculated using the CKD EPI equation.     This calculation has not been validated in all clinical situations.     eGFR's persistently <90 mL/min signify possible Chronic Kidney     Disease.  TYPE AND SCREEN     Status: None   Collection Time    06/23/13  1:36 PM      Result Value Range   ABO/RH(D) A POS     Antibody Screen NEG     Sample Expiration 06/26/2013     Unit Number [id number]     Blood Component Type RED CELLS,LR     Unit division 00     Status of Unit ISSUED,FINAL     Transfusion Status OK TO TRANSFUSE     Crossmatch Result Compatible     Unit Number [id number]     Blood Component Type RED CELLS,LR     Unit division 00     Status of Unit ISSUED,FINAL     Transfusion Status OK TO TRANSFUSE     Crossmatch Result Compatible    PREPARE RBC (CROSSMATCH)     Status: None   Collection Time    06/23/13  1:36 PM      Result Value Range   Order Confirmation ORDER PROCESSED BY BLOOD BANK  BASIC METABOLIC PANEL     Status: Abnormal   Collection Time    06/24/13  4:58 AM      Result Value Range   Sodium 120 (*) 135 - 145 mEq/L   Potassium 2.9 (*) 3.5 - 5.1 mEq/L   Chloride 84 (*) 96 - 112 mEq/L   CO2 28  19 - 32 mEq/L   Glucose, Bld 111 (*) 70 - 99 mg/dL   BUN 10  6 - 23 mg/dL   Creatinine, Ser 0.98  0.50 - 1.35 mg/dL   Calcium 8.0 (*) 8.4 - 10.5 mg/dL   GFR calc non Af Amer 83 (*) >90 mL/min   GFR calc Af Amer >90  >90 mL/min   Comment: (NOTE)     The eGFR has been calculated using  the CKD EPI equation.     This calculation has not been validated in all clinical situations.     eGFR's persistently <90 mL/min signify possible Chronic Kidney     Disease.  CBC     Status: Abnormal   Collection Time    06/24/13  4:58 AM      Result Value Range   WBC 10.2  4.0 - 10.5 K/uL   RBC 2.93 (*) 4.22 - 5.81 MIL/uL   Hemoglobin 9.6 (*) 13.0 - 17.0 g/dL   HCT 11.9 (*) 14.7 - 82.9 %   MCV 89.4  78.0 - 100.0 fL   MCH 32.8  26.0 - 34.0 pg   MCHC 36.6 (*) 30.0 - 36.0 g/dL   RDW 56.2  13.0 - 86.5 %   Platelets 121 (*) 150 - 400 K/uL   No results found. Carotid doppler on 02-10-13: Bilateral - Less than 40% ICA stenosis. Vertebral arery flow is antegrade  Echo 02-10-13 - Left ventricle: The cavity size was normal. Systolic function was normal. The estimated ejection fraction was in the range of 60% to 65%. Wall motion was normal; there were no regional wall motion abnormalities.    Assessment and plan discussed with with attending physician and they are in agreement.    Felicie Morn PA-C [facility] [phone]  06/24/2013, 10:28 AM   Stroke Risk Factors - hypertension  Assessment: 79 y.o. male with recent right TKA on 06-22-13.  On the night of 06-23-13 he was noted to have 10-20 minute period of transient altered mental status and expressive difficulties.  Back in September patient had similar episode of difficulty finding words but no AMS was involved. In addition, patients sodium has decreased from 128-120, and K+ has decreased from 3.6 to 2.9.  Given symptoms TIA is likely, as with hyponatremia, one would expect a more persistent AMS. At present time ASA has been added to [place].   Recommend: 1) continue ASA with Gibson Ramp and when Gibson Ramp has been discontinued restart back on home regime of Plavix 2) I have talked to Eber Jones for [facility] and recommended IM consult for hyponatremia and hypokalemia.  3) TIA work up including MRI brain, FLP,  A1c, Echo  I have seen and evaluated the patient. I have reviewed the above note and made appropriate changes.  He has been off of his typical antiplatelet regimen(plavix only) and the plan was to restart this after xarelto therapy was finished. I would favor restarting antiplatelet therapy at this time, could use ASA with xarelto instead of plavix, but would go back to plavix monotherapy once xarelto is finished.    Ritta Slot, MD [facility] [phone]   If 7pm-  7am, please page neurology on call at [phone].

## 2013-06-24 NOTE — Progress Notes (Signed)
Physical Therapy Treatment Patient Details Name: Craig [name] MRN: [id number] DOB: April 21, 1926 Today's Date: 06/24/2013 Time: 4098-1191 PT Time Calculation (min): 53 min  PT Assessment / Plan / Recommendation  History of Present Illness RTKA with post op nausea/anemia   PT Comments   POD # 3 am session.  RN reported pt had about a 20 min episode of increased confusion and expressive aphasia.  Pt OOB in recliner this am with family at bedside.  Assisted pt out of recliner.  Notably required increased assist and demon increased unsteadyness with difficulty righting self to center.  Pt also demon increased unsteadyness and gait instability requiring increased assist.  Pt conversive and following commands but required repeat instructions.   Follow Up Recommendations  SNF     Does the patient have the potential to tolerate intense rehabilitation     Barriers to Discharge        Equipment Recommendations       Recommendations for Other Services    Frequency 7X/week   Progress towards PT Goals Progress towards PT goals: Progressing toward goals  Plan      Precautions / Restrictions Precautions Precautions: Fall;Knee Precaution Comments: Instructed pt and spouse on KI use for amb Required Braces or Orthoses: Knee Immobilizer - Right Restrictions Weight Bearing Restrictions: No Other Position/Activity Restrictions: WBAT    Pertinent Vitals/Pain No c/o pain    Mobility  Bed Mobility Bed Mobility: Not assessed Details for Bed Mobility Assistance: Pt OOB in recliner  Transfers Transfers: Sit to Stand;Stand to Sit Sit to Stand: 1: +2 Total assist Sit to Stand: Patient Percentage: 30% Stand to Sit: 1: +2 Total assist Stand to Sit: Patient Percentage: 30% Details for Transfer Assistance: Pt required increased assist with sit to stand and stand to sit also needing repeat cueing and increased time to process instructions.    Ambulation/Gait Ambulation/Gait Assistance: 1: +2  Total assist Ambulation/Gait: Patient Percentage: 50% Ambulation Distance (Feet): 14 Feet Ambulation/Gait Assistance Details: Pt required increased assist to amb with notable increased LOB and difficulty finding his own center of gravity.  Severe posterior LOB and delayed righting self.  Unsteady.  HIGH FALL RISK.  Chair following closely behind as pt fatigues quickly.  Gait Pattern: Step-to pattern;Antalgic;Trunk flexed Gait velocity: decreased.    Exercises   Total Knee Replacement TE's 10 reps B LE ankle pumps 10 reps knee presses 10 reps heel slides  10 reps SAQ's 10 reps SLR's 10 reps ABD Followed by [facility]    PT Goals (current goals can now be found in the care plan section)    Visit Information  Last PT Received On: 06/24/13 Assistance Needed: +1 History of Present Illness: RTKA with post op nausea/anemia    Subjective Data      Cognition       Balance     End of Session PT - End of Session Equipment Utilized During Treatment: Gait belt Activity Tolerance: Patient limited by fatigue Patient left: in chair;with call bell/phone within reach;with family/visitor present   [name]  PTA [facility]  Acute  Rehab Pager      [phone]

## 2013-06-24 NOTE — Consult Note (Signed)
[facility] Medical Consultation  SERAPIO EDELSON ZOX:[id number] DOB: May 27, 1926 DOA: 06/21/2013 PCP: Lorenda Peck, MD   Requesting physician: Dr Despina Hick Date of consultation: 06/24/2013 Reason for consultation: Hyponatremia  Impression/Recommendations Principal Problem:   OA (osteoarthritis) of knee Active Problems:   Postoperative anemia due to acute blood loss   Hyponatremia   Dysphonia, Transient   TIA (transient ischemic attack)    1. Hyponatremia. Patient having gradual downward trend in sodium levels, from 134 on 06/15/2013 to 114 on today's lab work. Looking back at previous levels, patient does not appear to have a history of hyponatremia. He did have mild trace edema to extremities, which could be consistent with fluid overload state as he is presently receiving normal saline at 100 mL per hour. I discussed case with Dr Lacy Duverney of nephrology who recommended checking a urine osm, after that administering 30 mg of samasca times one dose, repeat sodium levels and 4 hours. Nursing staff reported that patient may have had some mental status changes, becoming little more lethargic. I placed an order for urine osm, urine sodium, serum osm. Will repeat a sodium level at 1 AM. 2. Hypokalemia. Patient's potassium at 3.1, has been ordered IV potassium chloride as he is currently unable to take by mouth due to nausea.  I will followup again tomorrow. Please contact me if I can be of assistance in the meanwhile. Thank you for this consultation.  Chief Complaint: Hyponatremia  HPI: Craig [name] is a pleasant 79 year old gentleman with a past medical history of hypertension, dyslipidemia, osteoarthritis, gastroesophageal reflux disease who was admitted to the orthopedic surgery service where he underwent right total knee arthroplasty, procedure performed by Dr Despina Hick on 06/21/2013.. On 06/23/2013 patient was noted by nursing staff to have difficulty speaking and word finding  difficulties. Labs reviewed as his sodium dropped from 127 on 06/22/2013 to 123 on 06/23/2013. Neurology recommending CVA workup with MRI of brain, which was done on 06/24/2013 and showed no acute infarct. He remains on Xarelto and aspirin therapy. The sodium levels are now at 114 as nursing staff noting the development of mental status changes, patient now having letharg and difficulty getting words out. I discussed case with orthopedic surgery, recommending that nephrology also be involved in management of patient's electrolyte disorders.                                   Review of Systems:  Patient endorses nausea, generalized weakness, mild confusion, right lower extremity pain. Denies a seizure activity, headaches, visual changes, fevers, chills, abdominal pain, chest pain, shortness of breath, extremity edema, bloody stools, dysuria, hematuria.    Past Medical History  Diagnosis Date  . Hypertension   . Degenerative disc disease   . Degenerative joint disease   . Genital herpes   . Diverticulitis   . Prostate cancer    Past Surgical History  Procedure Laterality Date  . Total knee arthroplasty Right 06/21/2013    Procedure: RIGHT TOTAL KNEE ARTHROPLASTY;  Surgeon: Loanne Drilling, MD;  Location: [facility];  Service: Orthopedics;  Laterality: Right;   Social History:  reports that he quit smoking about 29 years ago. He does not have any smokeless tobacco history on file. He reports that he drinks about 1.2 ounces of alcohol per week. His drug history is not on file.  Allergies  Allergen Reactions  . Penicillins Other (See Comments)    unknown  History reviewed. No pertinent family history.  Prior to Admission medications   Medication Sig Start Date End Date Taking? Authorizing Provider  acyclovir (ZOVIRAX) 200 MG capsule Take 200 mg by mouth daily.  08/27/11  Yes Historical Provider, MD  amLODipine (NORVASC) 5 MG tablet Take 5 mg by mouth every morning.    Yes Historical Provider, MD   atorvastatin (LIPITOR) 10 MG tablet Take 10 mg by mouth every Monday, Wednesday, and Friday.  04/23/12  Yes Historical Provider, MD  denosumab (XGEVA) 120 MG/1.7ML SOLN injection Inject 120 mg into the skin every 30 (thirty) days.   Yes Historical Provider, MD  doxycycline (VIBRAMYCIN) 100 MG capsule Take 100 mg by mouth 2 (two) times daily.   Yes Historical Provider, MD  hydrochlorothiazide (HYDRODIURIL) 50 MG tablet Take 50 mg by mouth every morning.  08/27/11  Yes Historical Provider, MD  leuprolide (LUPRON) 30 MG injection Inject 30 mg into the muscle every 30 (thirty) days.   Yes Historical Provider, MD  LORazepam (ATIVAN) 0.5 MG tablet Take 0.5 mg by mouth at bedtime.  04/29/13  Yes Historical Provider, MD  aspirin EC 81 MG tablet Take 40.5 mg by mouth every morning.    Historical Provider, MD  B Complex-C (SUPER B COMPLEX PO) Take 1 capsule by mouth 2 (two) times daily.     Historical Provider, MD  bicalutamide (CASODEX) 50 MG tablet Take 50 mg by mouth every morning.  01/05/13   Historical Provider, MD  Calcium Carbonate-Vitamin D (CALCIUM 600 + D PO) Take 1 tablet by mouth 2 (two) times daily.    Historical Provider, MD  clopidogrel (PLAVIX) 75 MG tablet Take 75 mg by mouth every morning.  02/02/13   Historical Provider, MD  lisinopril (PRINIVIL,ZESTRIL) 40 MG tablet Take 40 mg by mouth every morning.  08/27/11   Historical Provider, MD   Physical Exam: Blood pressure 147/89, pulse 69, temperature 98.3 F (36.8 C), temperature source Oral, resp. rate 16, height 5\' 10"  (1.778 m), weight 74.8 kg (164 lb 14.5 oz), SpO2 94.00%. Filed Vitals:   06/24/13 2105  BP: 147/89  Pulse: 69  Temp: 98.3 F (36.8 C)  Resp: 16     General:  Patient mildly lethargic however did answer questions appropriately, appears ill  Eyes: Pupils equal round reactive to light extraocular movement intact  Neck: Neck supple symmetrical, I did  Not note jugular venous distention  Cardiovascular: Regular rate rhythm  normal S1-S2  Respiratory: Lungs overall clear to auscultation, no crackles were noted. Normal respiratory effort  Abdomen: Soft nontender nondistended positive bowel sounds  Skin: Intact  Musculoskeletal: Status post right total knee replacement, with associated swelling to his right lower extremity, there may be trace edema to other extremities.  Psychiatric: Mildly lethargic however was able to answer my questions appropriately.  Neurologic: Cranial nerves II through XII grossly intact no alteration to sensation global 5 of most  Labs on Admission:  Basic Metabolic Panel:  Recent Labs Lab 06/22/13 0415 06/23/13 0457 06/24/13 0458 06/24/13 1320 06/24/13 1838  NA 127* 123* 120* 115* 114*  K 3.6 3.2* 2.9* 3.1* 3.1*  CL 90* 88* 84* 80* 79*  CO2 27 27 28 28 28   GLUCOSE 169* 115* 111* 107* 110*  BUN 17 12 10 10 9   CREATININE 0.82 0.81 0.73 0.77 0.69  CALCIUM 8.4 8.3* 8.0* 7.9* 8.2*   Liver Function Tests:  Recent Labs Lab 06/24/13 1320  AST 20  ALT 12  ALKPHOS 46  BILITOT 0.9  PROT 5.3*  ALBUMIN 3.0*   No results found for this basename: LIPASE, AMYLASE,  in the last 168 hours No results found for this basename: AMMONIA,  in the last 168 hours CBC:  Recent Labs Lab 06/22/13 0415 06/23/13 0457 06/24/13 0458  WBC 16.0* 16.3* 10.2  HGB 10.5* 8.3* 9.6*  HCT 28.6* 22.0* 26.2*  MCV 95.3 93.6 89.4  PLT 158 138* 121*   Cardiac Enzymes: No results found for this basename: CKTOTAL, CKMB, CKMBINDEX, TROPONINI,  in the last 168 hours BNP: No components found with this basename: POCBNP,  CBG: No results found for this basename: GLUCAP,  in the last 168 hours  Radiological Exams on Admission: Mr Brain Wo Contrast  06/24/2013   CLINICAL DATA:  Post knee replacement. Episode of altered mental status with difficulty expressing himself. Question stroke  EXAM: MRI HEAD WITHOUT CONTRAST  TECHNIQUE: Multiplanar, multisequence MR imaging was performed. No intravenous  contrast was administered.  COMPARISON:  11/23/2010 CT.  FINDINGS: No acute infarct.  Mild to moderate small vessel disease type changes.  No intracranial hemorrhage.  Global atrophy without hydrocephalus.  No intracranial mass lesion noted on this unenhanced exam.  Major intracranial vascular structures are patent.  Mild cervical spondylotic changes upper cervical spine.  IMPRESSION: No acute infarct. Please see above.   Electronically Signed   By: Bridgett Larsson M.D.   On: 06/24/2013 15:42      Time spent: 70 minutes  Jeralyn Bennett [facility] Pager [phone]  If 7PM-7AM, please contact night-coverage [email or web address] Password [email or web address] 06/24/2013, 9:24 PM

## 2013-06-24 NOTE — Progress Notes (Signed)
PHYSICAL THERAPY 16:00 - 16:16 Time spent 16 min 1 ta  PM session.  Pt too fatigued to amb this afternoon having just returned from MRI.  So assisted out of recliner to take a few steps back to bed.  Pt required + 2 total assist and was unable to functionally take steps back so bed pulled up behind pt.  Pt very fatigued.  Positioned in supine. [name]  PTA [facility]  Acute  Rehab Pager      [phone]

## 2013-06-25 DIAGNOSIS — D62 Acute posthemorrhagic anemia: Secondary | ICD-10-CM

## 2013-06-25 DIAGNOSIS — I369 Nonrheumatic tricuspid valve disorder, unspecified: Secondary | ICD-10-CM

## 2013-06-25 LAB — CBC
HCT: 25.9 % — ABNORMAL LOW (ref 39.0–52.0)
MCH: 33.1 pg (ref 26.0–34.0)
MCHC: 37.8 g/dL — ABNORMAL HIGH (ref 30.0–36.0)
Platelets: 152 10*3/uL (ref 150–400)
RDW: 13.7 % (ref 11.5–15.5)
WBC: 10.3 10*3/uL (ref 4.0–10.5)

## 2013-06-25 LAB — BASIC METABOLIC PANEL
BUN: 8 mg/dL (ref 6–23)
BUN: 11 mg/dL (ref 6–23)
BUN: 12 mg/dL (ref 6–23)
CO2: 26 mEq/L (ref 19–32)
CO2: 27 mEq/L (ref 19–32)
CO2: 28 mEq/L (ref 19–32)
Calcium: 7.9 mg/dL — ABNORMAL LOW (ref 8.4–10.5)
Calcium: 8 mg/dL — ABNORMAL LOW (ref 8.4–10.5)
Calcium: 8.3 mg/dL — ABNORMAL LOW (ref 8.4–10.5)
Calcium: 8.1 mg/dL — ABNORMAL LOW (ref 8.4–10.5)
Calcium: 8.7 mg/dL (ref 8.4–10.5)
Chloride: 79 mEq/L — ABNORMAL LOW (ref 96–112)
Chloride: 80 mEq/L — ABNORMAL LOW (ref 96–112)
Chloride: 85 mEq/L — ABNORMAL LOW (ref 96–112)
Creatinine, Ser: 0.62 mg/dL (ref 0.50–1.35)
Creatinine, Ser: 0.76 mg/dL (ref 0.50–1.35)
Creatinine, Ser: 0.78 mg/dL (ref 0.50–1.35)
GFR calc Af Amer: >90 mL/min (ref >90)
GFR calc Af Amer: >90 mL/min (ref >90)
GFR calc Af Amer: >90 mL/min (ref >90)
GFR calc non Af Amer: 81 mL/min — ABNORMAL LOW (ref >90)
GFR calc non Af Amer: 85 mL/min — ABNORMAL LOW (ref >90)
Glucose, Bld: 110 mg/dL — ABNORMAL HIGH (ref 70–99)
Glucose, Bld: 145 mg/dL — ABNORMAL HIGH (ref 70–99)
Glucose, Bld: 131 mg/dL — ABNORMAL HIGH (ref 70–99)
Potassium: 3.5 mEq/L (ref 3.5–5.1)
Potassium: 3.6 mEq/L (ref 3.5–5.1)
Sodium: 112 mEq/L — CL (ref 135–145)
Sodium: 117 mEq/L — CL (ref 135–145)

## 2013-06-25 LAB — SODIUM, URINE, RANDOM: Sodium, Ur: 134 mEq/L

## 2013-06-25 LAB — OSMOLALITY, URINE: Osmolality, Ur: 422 mOsm/kg (ref 390–1090)

## 2013-06-25 LAB — TSH: TSH: 0.84 u[IU]/mL (ref 0.350–4.500)

## 2013-06-25 MED ORDER — POTASSIUM CHLORIDE 10 MEQ/100ML IV SOLN
10.0000 meq | INTRAVENOUS | Status: AC
  Administered 2013-06-25 (×3): 10 meq via INTRAVENOUS
  Filled 2013-06-25 (×3): qty 100

## 2013-06-25 MED ORDER — TOLVAPTAN 15 MG PO TABS
15.0000 mg | ORAL_TABLET | ORAL | Status: DC
  Filled 2013-06-25: qty 1

## 2013-06-25 NOTE — Progress Notes (Signed)
Physical Therapy Treatment Patient Details Name: DAWAN FARNEY MRN: [id number] DOB: 10/03/1925 Today's Date: 06/25/2013 Time: 1000-1025 PT Time Calculation (min): 25 min  PT Assessment / Plan / Recommendation  History of Present Illness RTKA with post op AMS   PT Comments   POD # 4 pt moved to telemetry unit. Pt progressing poorly with his mobility.  Still requires + 2 assist and demon deceased amb distance/toerance each day due to MAX c/o weakness/fatigue. "I feel as weak as a puppy".  Attempted to from bed to bathroom however pt was unable past 5 feet.  Chair brought to him.  Assisted to [facility] for attempted BM, unsuccessful then assisted to recliner.  After 30 min in recliner pt asked to go back to bed.   Follow Up Recommendations  SNF     Does the patient have the potential to tolerate intense rehabilitation     Barriers to Discharge        Equipment Recommendations  None recommended by PT    Recommendations for Other Services    Frequency     Progress towards PT Goals Progress towards PT goals: Progressing toward goals  Plan      Precautions / Restrictions Precautions Precautions: Fall;Knee Required Braces or Orthoses: Knee Immobilizer - Right Restrictions Weight Bearing Restrictions: No Other Position/Activity Restrictions: WBAT    Pertinent Vitals/Pain No c/o pain    Mobility  Bed Mobility Bed Mobility: Supine to Sit Supine to Sit: 3: Mod assist;2: Max assist Details for Bed Mobility Assistance: pt required increased time with difficulty righting self to midline Transfers Transfers: Sit to Stand;Stand to Sit Sit to Stand: 1: +2 Total assist;From bed;From chair/3-in-1;From toilet Sit to Stand: Patient Percentage: 30% Stand to Sit: 1: +2 Total assist;To chair/3-in-1;To toilet Stand to Sit: Patient Percentage: 30% Details for Transfer Assistance: Pt continues to require + 2 total assist to rise and demon severe posterior lean with difficulty righting self to  midline.   Ambulation/Gait Ambulation/Gait Assistance: 1: +2 Total assist Ambulation/Gait: Patient Percentage: 40% Ambulation Distance (Feet): 5 Feet Assistive device: Rolling walker Ambulation/Gait Assistance Details: pt not ptogressing with his gait as he should.  MAX c/o weakness/fatigue.  Pt stated "I am as weak as a puppy".  Great difficulty weight shifting and supporting self in upright.  Gait Pattern: Step-to pattern;Antalgic;Trunk flexed Gait velocity: decreased.     PT Goals (current goals can now be found in the care plan section)    Visit Information  Last PT Received On: 06/25/13 Assistance Needed: +2 History of Present Illness: RTKA with post op AMS    Subjective Data      Cognition       Balance     End of Session PT - End of Session Equipment Utilized During Treatment: Gait belt Activity Tolerance: Patient limited by fatigue Patient left: in chair;with call bell/phone within reach;with family/visitor present   [name]  PTA [facility]  Acute  Rehab Pager      [phone]

## 2013-06-25 NOTE — Progress Notes (Signed)
CRITICAL VALUE ALERT  Critical value received: Na 112  Date of notification: 06/25/13  Time of notification: 04:15   Critical value read back: yes  Nurse who received alert: Ernesta Amble  MD notified (1st page): Tharon Aquas, NP  Time of first page: 04:30  MD notified (2nd page):  Time of second page:  Responding MD: Tharon Aquas, NP   Time MD responded: 04:40

## 2013-06-25 NOTE — Progress Notes (Signed)
[facility] PROGRESS NOTE  Craig [name] ZOX:[id number] DOB: 1925/11/13 DOA: 06/21/2013 PCP: Lorenda Peck, MD  Assessment/Plan: Hyponatremia  -severe, has trended down from 127 on 10/28 to 115 10/30  -Sodium was continued to trend down overnight and renal consulted >>patient started Samsca And IV fluids DC'ed and sodium up to 117 this p.m.  -Urine sodium elevated but the patient was on HCTZ -TSH wnl, cortisol still pending  -Per renal likely secondary to hypervolemia, will obtain BNP and follow/await echo Hypokalemia  Likely due to diuretic, again replace k  Dysphonia, Transient  - per Neuro, MRI negative. Await 2-D echo OA (osteoarthritis) of knee  -per primary team, s/p surgery  Postoperative anemia due to acute blood loss  -hgb stable    Code Status: Full Family Communication: Daughter. at the bedside Disposition Plan: per primary team     Procedures:  Echocardiogram-pending  Antibiotics:  None  HPI/Subjective: Patient states much better no further vomiting, no seizures reported overnight.  Objective: Filed Vitals:   06/25/13 0513  BP: 148/89  Pulse: 76  Temp: 97.7 F (36.5 C)  Resp: 18    Intake/Output Summary (Last 24 hours) at 06/25/13 0910 Last data filed at 06/25/13 0731  Gross per 24 hour  Intake    680 ml  Output   2450 ml  Net  -1770 ml   Filed Weights   06/21/13 2100  Weight: 74.8 kg (164 lb 14.5 oz)    Exam:  General: alert & a little slow but oriented x 3 Cardiovascular: RRR, nl S1 s2 Respiratory: CTAB Abdomen: soft +BS NT/ND, no masses palpable Extremities: No cyanosis and trace RLE edema, left lower extremity with no edema   Data Reviewed: Basic Metabolic Panel:  Recent Labs Lab 06/24/13 1320 06/24/13 1838 06/24/13 2253 06/25/13 0324 06/25/13 0805  NA 115* 114* 114* 112* 113*  K 3.1* 3.1* 3.4* 3.5 3.2*  CL 80* 79* 79* 79* 80*  CO2 28 28 27 26 24   GLUCOSE 107* 110* 110* 100* 102*  BUN 10 9 8 8 8    CREATININE 0.77 0.69 0.56 0.64 0.62  CALCIUM 7.9* 8.2* 7.6* 7.9* 8.0*   Liver Function Tests:  Recent Labs Lab 06/24/13 1320  AST 20  ALT 12  ALKPHOS 46  BILITOT 0.9  PROT 5.3*  ALBUMIN 3.0*   No results found for this basename: LIPASE, AMYLASE,  in the last 168 hours No results found for this basename: AMMONIA,  in the last 168 hours CBC:  Recent Labs Lab 06/22/13 0415 06/23/13 0457 06/24/13 0458 06/25/13 0805  WBC 16.0* 16.3* 10.2 10.3  HGB 10.5* 8.3* 9.6* 9.8*  HCT 28.6* 22.0* 26.2* 25.9*  MCV 95.3 93.6 89.4 87.5  PLT 158 138* 121* 152   Cardiac Enzymes: No results found for this basename: CKTOTAL, CKMB, CKMBINDEX, TROPONINI,  in the last 168 hours BNP (last 3 results) No results found for this basename: PROBNP,  in the last 8760 hours CBG: No results found for this basename: GLUCAP,  in the last 168 hours  Recent Results (from the past 240 hour(s))  MRSA PCR SCREENING     Status: None   Collection Time    06/25/13  1:47 AM      Result Value Range Status   MRSA by PCR NEGATIVE  NEGATIVE Final   Comment:            The GeneXpert MRSA Assay (FDA     approved for NASAL specimens     only), is  one component of a     comprehensive MRSA colonization     surveillance program. It is not     intended to diagnose MRSA     infection nor to guide or     monitor treatment for     MRSA infections.     Studies: Mr Brain Wo Contrast  06/24/2013   CLINICAL DATA:  Post knee replacement. Episode of altered mental status with difficulty expressing himself. Question stroke  EXAM: MRI HEAD WITHOUT CONTRAST  TECHNIQUE: Multiplanar, multisequence MR imaging was performed. No intravenous contrast was administered.  COMPARISON:  11/23/2010 CT.  FINDINGS: No acute infarct.  Mild to moderate small vessel disease type changes.  No intracranial hemorrhage.  Global atrophy without hydrocephalus.  No intracranial mass lesion noted on this unenhanced exam.  Major intracranial vascular  structures are patent.  Mild cervical spondylotic changes upper cervical spine.  IMPRESSION: No acute infarct. Please see above.   Electronically Signed   By: Bridgett Larsson M.D.   On: 06/24/2013 15:42    Scheduled Meds: . acyclovir  200 mg Oral Daily  . amLODipine  5 mg Oral q morning - 10a  . aspirin  40.5 mg Oral Daily  . atorvastatin  10 mg Oral Q M,W,F  . bicalutamide  50 mg Oral q morning - 10a  . docusate sodium  100 mg Oral BID  . LORazepam  0.5 mg Oral QHS  . rivaroxaban  10 mg Oral Q breakfast   Continuous Infusions:   Principal Problem:   OA (osteoarthritis) of knee Active Problems:   Postoperative anemia due to acute blood loss   Hyponatremia   Dysphonia, Transient   TIA (transient ischemic attack)    Time spent: 35    [facility]  [facility] Pager [phone]. If 7PM-7AM, please contact night-coverage at [email or web address], password [facility] 06/25/2013, 9:10 AM  LOS: 4 days

## 2013-06-25 NOTE — Progress Notes (Signed)
Patient was transferred to me from 6th floor around 11:30 last night.  Report was given to hold tolvaptan until urine cultures were back, after speaking to JC in pharmacy this information was wrong.  Tolvaptan was given at 4:30 this morning, T. Duanne Moron, NP was notified about this.  Patient is alert and oriented and resting well.  Will continue to monitor.  Ernesta Amble, RN

## 2013-06-25 NOTE — Progress Notes (Signed)
CRITICAL VALUE ALERT  Critical value received:  Sodium 113 at 0805 Sodium  116  at 1225 Sodium  117 at 1610    Date of notification:  06/25/13  Critical value read back:yes  Nurse who received alert:  J.Santoria Chason, RN

## 2013-06-25 NOTE — Progress Notes (Signed)
NEURO HOSPITALIST PROGRESS NOTE   SUBJECTIVE:                                                                                                                        Patient is alert and oriented, having no symptoms at present time.   OBJECTIVE:                                                                                                                           Vital signs in last 24 hours: Temp:  [97.7 F (36.5 C)-98.3 F (36.8 C)] 97.7 F (36.5 C) (10/31 0513) Pulse Rate:  [65-76] 76 (10/31 0513) Resp:  [16-20] 18 (10/31 0513) BP: (106-148)/(69-89) 148/89 mmHg (10/31 0513) SpO2:  [94 %-99 %] 99 % (10/31 0513)  Intake/Output from previous day: 10/30 0701 - 10/31 0700 In: 920 [P.O.:920] Out: 2350 [Urine:2350] Intake/Output this shift: Total I/O In: -  Out: 300 [Urine:300] Nutritional status: Renal  Past Medical History  Diagnosis Date  . Hypertension   . Degenerative disc disease   . Degenerative joint disease   . Genital herpes   . Diverticulitis   . Prostate cancer       Neurologic Exam:  Mental Status:  Alert, oriented, thought content appropriate. Speech fluent without evidence of aphasia. Able to follow 3 step commands without difficulty.  Cranial Nerves:  II: Discs flat bilaterally; Visual fields grossly normal, pupils equal, round, reactive to light and accommodation  III,IV, VI: ptosis not present, extra-ocular motions intact bilaterally  V,VII: smile symmetric, facial light touch sensation normal bilaterally  VIII: hearing normal bilaterally  IX,X: gag reflex present  XI: bilateral shoulder shrug  XII: midline tongue extension without atrophy or fasciculations  Motor:  Right :  Upper extremity 5/5  Left:  Upper extremity 5/5   Lower extremity 5/5   Lower extremity In splint but shows proximal 3/5 and distal 5/5  Tone and bulk:normal tone throughout; no atrophy noted  Sensory: Pinprick and light touch intact  throughout, bilaterally  Deep Tendon Reflexes:  Right: Upper Extremity  Left: Upper extremity  1+ throughout  Plantars:  Right: downgoing  Left: downgoing  Cerebellar:  normal finger-to-nose,  CV: pulses palpable throughout      Lab  Results: Lab Results  Component Value Date/Time   CHOL 126 06/24/2013  4:58 AM   Lipid Panel  Recent Labs  06/24/13 0458  CHOL 126  TRIG 72  HDL 62  CHOLHDL 2.0  VLDL 14  LDLCALC 50   A1c 5.5   Studies/Results: Mr Brain Wo Contrast  06/24/2013   CLINICAL DATA:  Post knee replacement. Episode of altered mental status with difficulty expressing himself. Question stroke  EXAM: MRI HEAD WITHOUT CONTRAST  TECHNIQUE: Multiplanar, multisequence MR imaging was performed. No intravenous contrast was administered.  COMPARISON:  11/23/2010 CT.  FINDINGS: No acute infarct.  Mild to moderate small vessel disease type changes.  No intracranial hemorrhage.  Global atrophy without hydrocephalus.  No intracranial mass lesion noted on this unenhanced exam.  Major intracranial vascular structures are patent.  Mild cervical spondylotic changes upper cervical spine.  IMPRESSION: No acute infarct. Please see above.   Electronically Signed   By: Bridgett Larsson M.D.   On: 06/24/2013 15:42    MEDICATIONS                                                                                                                        Scheduled: . acyclovir  200 mg Oral Daily  . amLODipine  5 mg Oral q morning - 10a  . aspirin  40.5 mg Oral Daily  . atorvastatin  10 mg Oral Q M,W,F  . bicalutamide  50 mg Oral q morning - 10a  . docusate sodium  100 mg Oral BID  . LORazepam  0.5 mg Oral QHS  . rivaroxaban  10 mg Oral Q breakfast    ASSESSMENT/PLAN:                                                                                                            79 y.o. male with recent right TKA on 06-22-13. On the night of 06-23-13 he was noted to have 10-20 minute period of transient  altered mental status and expressive difficulties. Back in September patient had similar episode of difficulty finding words but no AMS was involved. In addition, patients sodium has decreased from 128-113, and K+ has decreased returned to normal. Given symptoms TIA is likely, as with hyponatremia, one would expect a more persistent AMS. At present time ASA has been added to [place].    Recommend:  1) continue ASA with Gibson Ramp and when Gibson Ramp has been discontinued restart back on home regime of Plavix 2) if echo shows no source of emboli neurology will S/O  Assessment and plan discussed with with attending physician and they are in agreement.    Felicie Morn PA-C [facility] [phone]  06/25/2013, 9:12 AM

## 2013-06-25 NOTE — Progress Notes (Signed)
Physical Therapy Treatment Patient Details Name: Craig [name] MRN: [id number] DOB: June 21, 1926 Today's Date: 06/25/2013 Time: 4098-1191 PT Time Calculation (min): 33 min  PT Assessment / Plan / Recommendation  History of Present Illness RTKA with post op AMS   PT Comments   POD # 4 pm session.  Performed TKR TE's as pt was too fatigued to attempt OOB act.  Pt required increased time and repeat cueing to complete task of TE's.  Nearly 18 min to complete all supine TE's where it only took 7 min a couple of days ago.  Placed R LE in CPM at 10-60 degrees.  Instructed on CPM several times before pt understood the process.  Applied ICE.   Follow Up Recommendations  SNF     Does the patient have the potential to tolerate intense rehabilitation     Barriers to Discharge        Equipment Recommendations  None recommended by PT    Recommendations for Other Services    Frequency     Progress towards PT Goals Progress towards PT goals: Progressing toward goals  Plan      Precautions / Restrictions Precautions Precautions: Fall;Knee Required Braces or Orthoses: Knee Immobilizer - Right Restrictions Weight Bearing Restrictions: No Other Position/Activity Restrictions: WBAT    Pertinent Vitals/Pain C/o "some" knee pain after TE's ICE applied       Exercises   Total Knee Replacement TE's 10 reps B LE ankle pumps 10 reps knee presses 10 reps heel slides  10 reps SAQ's 10 reps SLR's 10 reps ABD Followed by ICE   PT Goals (current goals can now be found in the care plan section)    Visit Information  Last PT Received On: 06/25/13 Assistance Needed: +2 History of Present Illness: RTKA with post op AMS    Subjective Data      Cognition    impaired   Balance     End of Session PT - End of Session Equipment Utilized During Treatment: Gait belt Activity Tolerance: Patient limited by fatigue Patient left: in bed;with call bell/phone within reach;with family/visitor  present   [name]  PTA [facility]  Acute  Rehab Pager      [phone]

## 2013-06-25 NOTE — Consult Note (Signed)
Reason for Consult: Hyponatremia Referring Physician: Naquan Garman is an 79 y.o. male with a past medical history significant for hypertension, hyperlipidemia, degenerative joint disease and GERD. He was admitted and underwent a right total knee arthroplasty on October 27. Of note, his preoperative labs on October 21 showed a sodium level of 134. Basically, he has experienced a decline in sodium since surgery. His sodium on postop day #1 was 127 and it has declined steadily since then. It was noted to be 115 in the early afternoon yesterday. I was not notified until 10 PM last night when sodium was 114. I have talked  to the hospitalist Dr. Vanessa Barbara over the phone. In my review of the data,  I noticed that he receives quite a bit of fluid postop. I was wondering if this was a case of hypervolemic hyponatremia. Work up has been initiated. I wanted to stop the IV fluids and challenge the patient with samsca.  Unfortunately, there was miscommunication the samsca, was not administered until early this morning. A sodium in the meantime is 112. Also of note, patient was having some neurologic difficulties which seemed to wax and wane. He was having difficulty speaking. Neurology was consulted. MRI was negative. This AM I have awakened him from a dead sleep. He seemed appropriate however having some difficulty answering simple questions. He denies pain. His MAR and contains narcotic medicines muscle relaxers and Ativan. And most labs for workup of hyponatremia are pending. Urine sodium is not low.  Since samsca, he has had significant UOP, now has a condom cath on.  He does not appear to have a history of hyponatremia.  Patient was on hydrochlorothiazide it was just stopped yesterday.  Trend in Creatinine: Creatinine  Date/Time Value Range Status  12/15/2012  9:34 AM 1.0  0.7 - 1.3 mg/dL Final  12/02/8117 14:78 AM 1.0  0.7 - 1.3 mg/dL Final  29/56/2130  8:65 AM 0.9  0.7 - 1.3 mg/dL Final      Creatinine, Ser  Date/Time Value Range Status  06/25/2013  3:24 AM 0.64  0.50 - 1.35 mg/dL Final  78/46/9629 52:84 PM 0.56  0.50 - 1.35 mg/dL Final  13/24/4010  2:72 PM 0.69  0.50 - 1.35 mg/dL Final  53/66/4403  4:74 PM 0.77  0.50 - 1.35 mg/dL Final  25/95/6387  5:64 AM 0.73  0.50 - 1.35 mg/dL Final  33/29/5188  4:16 AM 0.81  0.50 - 1.35 mg/dL Final  60/63/0160  1:09 AM 0.82  0.50 - 1.35 mg/dL Final  32/35/5732  2:02 AM 0.98  0.50 - 1.35 mg/dL Final  5/42/7062  3:76 AM 1.00  0.50 - 1.35 mg/dL Final  2/83/1517  6:16 PM 0.88  0.50 - 1.35 mg/dL Final  0/02/3709  6:26 AM 1.01  0.50 - 1.35 mg/dL Final  04/29/8545  2:70 PM 0.93  0.50 - 1.35 mg/dL Final  35/00/9381 82:99 AM 1.10  0.50 - 1.35 mg/dL Final  3/71/6967 89:38 AM 0.88  0.4 - 1.5 mg/dL Final  08/26/7508  2:58 AM 1.13  0.4 - 1.5 mg/dL Final  01/20/7823  2:35 PM 1.06  0.4 - 1.5 mg/dL Final   Sodium  Date/Time Value Range Status  06/25/2013  3:24 AM 112* 135 - 145 mEq/L Final     CRITICAL RESULT CALLED TO, READ BACK BY AND VERIFIED WITH:     LEWIS,S/4W @0357  ON 06/25/13 BY KARCZEWSKI,S.  06/24/2013 10:53 PM 114* 135 - 145 mEq/L Final     CRITICAL RESULT CALLED  TO, READ BACK BY AND VERIFIED WITH:     MOSTELLER,P/6E @2339  ON 06/24/13 BY KARCZEWSKI,S.  06/24/2013  6:38 PM 114* 135 - 145 mEq/L Final     CRITICAL RESULT CALLED TO, READ BACK BY AND VERIFIED WITH:     Benjaman Lobe RN 2005 06/24/13 A NAVARRO  06/24/2013  1:20 PM 115* 135 - 145 mEq/L Final     CRITICAL RESULT CALLED TO, READ BACK BY AND VERIFIED WITH:     OWEN,C. RN AT 1410 06/24/13 BARFIELD,T  06/24/2013  4:58 AM 120* 135 - 145 mEq/L Final  06/23/2013  4:57 AM 123* 135 - 145 mEq/L Final  06/22/2013  4:15 AM 127* 135 - 145 mEq/L Final  06/15/2013  9:05 AM 134* 135 - 145 mEq/L Final  12/15/2012  9:34 AM 141  136 - 145 mEq/L Final  09/16/2012 10:25 AM 137  136 - 145 mEq/L Final  06/16/2012  8:29 AM 139  136 - 145 mEq/L Final  04/14/2012  8:56 AM 139  135 - 145 mEq/L Final   02/11/2012  2:58 PM 142  135 - 145 mEq/L Final  11/27/2011  9:58 AM 140  135 - 145 mEq/L Final  08/29/2011  1:39 PM 139  135 - 145 mEq/L Final  07/26/2011 10:02 AM 139  135 - 145 mEq/L Final  12/23/2010 10:25 AM 135  135 - 145 mEq/L Final  11/23/2010  8:10 AM 142  135 - 145 mEq/L Final   PMH:   Past Medical History  Diagnosis Date  . Hypertension   . Degenerative disc disease   . Degenerative joint disease   . Genital herpes   . Diverticulitis   . Prostate cancer     PSH:   Past Surgical History  Procedure Laterality Date  . Total knee arthroplasty Right 06/21/2013    Procedure: RIGHT TOTAL KNEE ARTHROPLASTY;  Surgeon: Loanne Drilling, MD;  Location: [facility];  Service: Orthopedics;  Laterality: Right;    Allergies:  Allergies  Allergen Reactions  . Penicillins Other (See Comments)    unknown    Medications:   Prior to Admission medications   Medication Sig Start Date End Date Taking? Authorizing Provider  acyclovir (ZOVIRAX) 200 MG capsule Take 200 mg by mouth daily.  08/27/11  Yes Historical Provider, MD  amLODipine (NORVASC) 5 MG tablet Take 5 mg by mouth every morning.    Yes Historical Provider, MD  atorvastatin (LIPITOR) 10 MG tablet Take 10 mg by mouth every Monday, Wednesday, and Friday.  04/23/12  Yes Historical Provider, MD  denosumab (XGEVA) 120 MG/1.7ML SOLN injection Inject 120 mg into the skin every 30 (thirty) days.   Yes Historical Provider, MD  doxycycline (VIBRAMYCIN) 100 MG capsule Take 100 mg by mouth 2 (two) times daily.   Yes Historical Provider, MD  hydrochlorothiazide (HYDRODIURIL) 50 MG tablet Take 50 mg by mouth every morning.  08/27/11  Yes Historical Provider, MD  leuprolide (LUPRON) 30 MG injection Inject 30 mg into the muscle every 30 (thirty) days.   Yes Historical Provider, MD  LORazepam (ATIVAN) 0.5 MG tablet Take 0.5 mg by mouth at bedtime.  04/29/13  Yes Historical Provider, MD  aspirin EC 81 MG tablet Take 40.5 mg by mouth every morning.    Historical  Provider, MD  B Complex-C (SUPER B COMPLEX PO) Take 1 capsule by mouth 2 (two) times daily.     Historical Provider, MD  bicalutamide (CASODEX) 50 MG tablet Take 50 mg by mouth every morning.  01/05/13   Historical Provider, MD  Calcium Carbonate-Vitamin D (CALCIUM 600 + D PO) Take 1 tablet by mouth 2 (two) times daily.    Historical Provider, MD  clopidogrel (PLAVIX) 75 MG tablet Take 75 mg by mouth every morning.  02/02/13   Historical Provider, MD  lisinopril (PRINIVIL,ZESTRIL) 40 MG tablet Take 40 mg by mouth every morning.  08/27/11   Historical Provider, MD    Discontinued Meds:   Medications Discontinued During This Encounter  Medication Reason  . bupivacaine liposome (EXPAREL 1.3 %) with 0.9 % sodium chloride inj Patient Discharge  . bupivacaine (MARCAINE) 0.25 % (with pres) injection Patient Discharge  . sterile water for irrigation for irrigation Patient Discharge  . sodium chloride irrigation 0.9 % Patient Discharge  . 0.9 %  sodium chloride infusion Patient Transfer  . bupivacaine liposome (EXPAREL) 1.3 % injection 266 mg Patient Transfer  . dexamethasone (DECADRON) injection 10 mg Patient Transfer  . lactated ringers infusion Patient Transfer  . HYDROmorphone (DILAUDID) injection 0.25-0.5 mg Patient Transfer  . fentaNYL (SUBLIMAZE) injection 25 mcg Patient Transfer  . ceFAZolin (ANCEF) IVPB 1 g/50 mL premix   . methocarbamol (ROBAXIN) tablet 500 mg   . methocarbamol (ROBAXIN) 500 mg in dextrose 5 % 50 mL IVPB   . aspirin EC tablet 81 mg   . aspirin EC tablet 81 mg Change in therapy  . dextrose 5 %-0.9 % sodium chloride infusion   . hydrochlorothiazide (HYDRODIURIL) tablet 50 mg   . potassium chloride SA (K-DUR,KLOR-CON) CR tablet 40 mEq   . 0.9 %  sodium chloride infusion     Social History:  reports that he quit smoking about 29 years ago. He does not have any smokeless tobacco history on file. He reports that he drinks about 1.2 ounces of alcohol per week. His drug history  is not on file.  Family History:  History reviewed. No pertinent family history.  A comprehensive review of systems was negative.  Blood pressure 148/89, pulse 76, temperature 97.7 F (36.5 C), temperature source Oral, resp. rate 18, height 5\' 10"  (1.778 m), weight 74.8 kg (164 lb 14.5 oz), SpO2 99.00%. General appearance: no distress and slowed mentation Eyes: conjunctivae/corneas clear. PERRL, EOM's intact. Fundi benign. Neck: no adenopathy, no carotid bruit, no JVD, supple, symmetrical, trachea midline and thyroid not enlarged, symmetric, no tenderness/mass/nodules Resp: diminished breath sounds bibasilar Cardio: regular rate and rhythm, S1, S2 normal, no murmur, click, rub or gallop GI: soft, non-tender; bowel sounds normal; no masses,  no organomegaly Extremities: edema mostly on right which is surgical leg-3 + Skin: Skin color, texture, turgor normal. No rashes or lesions Neurologic: Mental status: Alert, oriented, thought content appropriate, awakens but not completely appropriate  Labs: Basic Metabolic Panel:  Recent Labs Lab 06/22/13 0415 06/23/13 0457 06/24/13 0458 06/24/13 1320 06/24/13 1838 06/24/13 2253 06/25/13 0324  NA 127* 123* 120* 115* 114* 114* 112*  K 3.6 3.2* 2.9* 3.1* 3.1* 3.4* 3.5  CL 90* 88* 84* 80* 79* 79* 79*  CO2 27 27 28 28 28 27 26   GLUCOSE 169* 115* 111* 107* 110* 110* 100*  BUN 17 12 10 10 9 8 8   CREATININE 0.82 0.81 0.73 0.77 0.69 0.56 0.64  ALBUMIN  --   --   --  3.0*  --   --   --   CALCIUM 8.4 8.3* 8.0* 7.9* 8.2* 7.6* 7.9*   Liver Function Tests:  Recent Labs Lab 06/24/13 1320  AST 20  ALT 12  ALKPHOS  46  BILITOT 0.9  PROT 5.3*  ALBUMIN 3.0*   No results found for this basename: LIPASE, AMYLASE,  in the last 168 hours No results found for this basename: AMMONIA,  in the last 168 hours CBC:  Recent Labs Lab 06/22/13 0415 06/23/13 0457 06/24/13 0458  WBC 16.0* 16.3* 10.2  HGB 10.5* 8.3* 9.6*  HCT 28.6* 22.0* 26.2*  MCV  95.3 93.6 89.4  PLT 158 138* 121*   PT/INR: @labrcntip (inr:5) Cardiac Enzymes: No results found for this basename: CKTOTAL, CKMB, CKMBINDEX, TROPONINI,  in the last 168 hours CBG: No results found for this basename: GLUCAP,  in the last 168 hours  Iron Studies: No results found for this basename: IRON, TIBC, TRANSFERRIN, FERRITIN,  in the last 168 hours  Xrays/Other Studies: Mr Brain Wo Contrast  06/24/2013   CLINICAL DATA:  Post knee replacement. Episode of altered mental status with difficulty expressing himself. Question stroke  EXAM: MRI HEAD WITHOUT CONTRAST  TECHNIQUE: Multiplanar, multisequence MR imaging was performed. No intravenous contrast was administered.  COMPARISON:  11/23/2010 CT.  FINDINGS: No acute infarct.  Mild to moderate small vessel disease type changes.  No intracranial hemorrhage.  Global atrophy without hydrocephalus.  No intracranial mass lesion noted on this unenhanced exam.  Major intracranial vascular structures are patent.  Mild cervical spondylotic changes upper cervical spine.  IMPRESSION: No acute infarct. Please see above.   Electronically Signed   By: Bridgett Larsson M.D.   On: 06/24/2013 15:42     Assessment/Plan: 79 year old white male with hyponatremia that actually predated his surgery but has progressively declined since surgery and now in a dangerous range. 1. Hyponatremia- I agree with discontinuing the HCTZ, that was probably contributing to this picture. I also am still suspicious that he may have gotten too volume overloaded in the postoperative period given his exam. I have given 1 dose of samsca which has led to appropriate increase in urine output. I hope that this will improve the sodium level as well. He needs to be monitored very frequently at least over the next 24 hours. I've written for checks every 4 hours. As the rest of his workup is revealed we may have more ideas. I will follow up on sodium level today for further plans question if patient  will need another dose of samsca or if we could just start him on Lasix. If sodium doesn't show signs of improvement at the next check and he may need 3% saline as well.  2. decreased mental status- I am sure her sodium has something to do with this as well. However is difficult to tease out in an elderly man in the postoperative period with narcotics and anxiolytics appropriately given for pain control. 3. Hypokalemia- agree with IV repletion.   Thank you for this consultation. We will continue to follow this patient closely with you.     Adekunle Rohrbach A 06/25/2013, 6:22 AM

## 2013-06-25 NOTE — Progress Notes (Signed)
*  PRELIMINARY RESULTS* Echocardiogram 2D Echocardiogram has been performed.  Craig [name] 06/25/2013, 4:36 PM

## 2013-06-25 NOTE — Progress Notes (Addendum)
Subjective: 4 Days Post-Op Procedure(s) (LRB): RIGHT TOTAL KNEE ARTHROPLASTY (Right) Patient reports pain as mild.   Patient seen in rounds for Dr. Lequita Halt who is out of town. Patient is well, but has had some minor complaints of pain in the knee, requiring pain medications Plan is to go Rehab at [facility] after hospital stay.  Greatly appreciate Neuro consult and workup.   Medicine and Renal consulted due to worsening sodium level.  Greatly appreciate guidance and assistance with the nice gentleman.    On rounds, he only complained of some mild knee pain and no other issues.  No family in room at that time.  Objective: Vital signs in last 24 hours: Temp:  [97.7 F (36.5 C)-98.3 F (36.8 C)] 97.7 F (36.5 C) (10/31 0513) Pulse Rate:  [69-76] 76 (10/31 0513) Resp:  [16-18] 18 (10/31 0513) BP: (141-148)/(71-89) 148/89 mmHg (10/31 0513) SpO2:  [94 %-99 %] 99 % (10/31 0513)  Intake/Output from previous day:  Intake/Output Summary (Last 24 hours) at 06/25/13 1422 Last data filed at 06/25/13 0930  Gross per 24 hour  Intake    440 ml  Output   2400 ml  Net  -1960 ml    Intake/Output this shift: Total I/O In: -  Out: 600 [Urine:600]  Labs:  Recent Labs  06/23/13 0457 06/24/13 0458 06/25/13 0805  HGB 8.3* 9.6* 9.8*    Recent Labs  06/24/13 0458 06/25/13 0805  WBC 10.2 10.3  RBC 2.93* 2.96*  HCT 26.2* 25.9*  PLT 121* 152    Recent Labs  06/25/13 0805 06/25/13 1225  NA 113* 116*  K 3.2* 3.1*  CL 80* 82*  CO2 24 27  BUN 8 8  CREATININE 0.62 0.76  GLUCOSE 102* 110*  CALCIUM 8.0* 8.3*   No results found for this basename: LABPT, INR,  in the last 72 hours  EXAM General - Patient is Alert, Appropriate and Oriented and recognized me immediately on rounds. Extremity - Neurovascular intact Sensation intact distally Dorsiflexion/Plantar flexion intact Dressing/Incision - clean, dry, no drainage, healing Motor Function - intact, moving foot and  toes well on exam.   Past Medical History  Diagnosis Date  . Hypertension   . Degenerative disc disease   . Degenerative joint disease   . Genital herpes   . Diverticulitis   . Prostate cancer     Assessment/Plan: 4 Days Post-Op Procedure(s) (LRB): RIGHT TOTAL KNEE ARTHROPLASTY (Right) Principal Problem:   OA (osteoarthritis) of knee Active Problems:   Postoperative anemia due to acute blood loss   Hyponatremia   Dysphonia, Transient   TIA (transient ischemic attack)  Estimated body mass index is 23.66 kg/(m^2) as calculated from the following:   Height as of this encounter: 5\' 10"  (1.778 m).   Weight as of this encounter: 74.8 kg (164 lb 14.5 oz). Discharge to SNF once he is medically stable. Currently has ongoing medical issues and workup.  DVT Prophylaxis - Xarelto and Aspirin Weight-Bearing as tolerated to right leg  The aspirin was resumed following the TIA episode.  Continues on Xarelto.  Will resume Plavix as soon as the Xarelto is completed.   MRI showed no acute infarct. ECHO pending as per notes. Diuresing as per Renal for the probable volume overload. Na+ early this morning was 112 and stable at 113 at rounds.    Dr. Deri Fuelling partners will be rounding daily throughout the weekend and will be available if needed.  Milli Woolridge 06/25/2013, 2:22 PM

## 2013-06-26 DIAGNOSIS — IMO0002 Reserved for concepts with insufficient information to code with codable children: Secondary | ICD-10-CM

## 2013-06-26 DIAGNOSIS — M1710 Unilateral primary osteoarthritis, unspecified knee: Secondary | ICD-10-CM

## 2013-06-26 LAB — RENAL FUNCTION PANEL
BUN: 14 mg/dL (ref 6–23)
Calcium: 8.5 mg/dL (ref 8.4–10.5)
Creatinine, Ser: 0.7 mg/dL (ref 0.50–1.35)
Glucose, Bld: 112 mg/dL — ABNORMAL HIGH (ref 70–99)
Phosphorus: 2.2 mg/dL — ABNORMAL LOW (ref 2.3–4.6)
Sodium: 123 mEq/L — ABNORMAL LOW (ref 135–145)

## 2013-06-26 LAB — BASIC METABOLIC PANEL
BUN: 13 mg/dL (ref 6–23)
CO2: 29 mEq/L (ref 19–32)
CO2: 29 mEq/L (ref 19–32)
Calcium: 8.4 mg/dL (ref 8.4–10.5)
Chloride: 87 mEq/L — ABNORMAL LOW (ref 96–112)
Creatinine, Ser: 0.74 mg/dL (ref 0.50–1.35)
Creatinine, Ser: 0.76 mg/dL (ref 0.50–1.35)
GFR calc non Af Amer: 82 mL/min — ABNORMAL LOW (ref >90)
Glucose, Bld: 112 mg/dL — ABNORMAL HIGH (ref 70–99)
Glucose, Bld: 121 mg/dL — ABNORMAL HIGH (ref 70–99)
Potassium: 3.7 mEq/L (ref 3.5–5.1)
Sodium: 122 mEq/L — ABNORMAL LOW (ref 135–145)
Sodium: 124 mEq/L — ABNORMAL LOW (ref 135–145)

## 2013-06-26 LAB — CBC
Hemoglobin: 9.3 g/dL — ABNORMAL LOW (ref 13.0–17.0)
MCH: 33.3 pg (ref 26.0–34.0)
MCHC: 36.5 g/dL — ABNORMAL HIGH (ref 30.0–36.0)
MCV: 91.4 fL (ref 78.0–100.0)
RBC: 2.79 MIL/uL — ABNORMAL LOW (ref 4.22–5.81)
WBC: 10.4 10*3/uL (ref 4.0–10.5)

## 2013-06-26 LAB — CORTISOL-AM, BLOOD: Cortisol - AM: 20.3 ug/dL (ref 4.3–22.4)

## 2013-06-26 NOTE — Progress Notes (Signed)
Subjective: Interval History: has complaints doesn't remember yest.  Objective: Vital signs in last 24 hours: Temp:  [97.8 F (36.6 C)-98.5 F (36.9 C)] 98.3 F (36.8 C) (11/01 0645) Pulse Rate:  [70-83] 73 (11/01 0645) Resp:  [16-18] 18 (11/01 0645) BP: (115-134)/(63-77) 125/63 mmHg (11/01 0645) SpO2:  [97 %-99 %] 99 % (11/01 0645) Weight change:   Intake/Output from previous day: 10/31 0701 - 11/01 0700 In: 360 [P.O.:360] Out: 3950 [Urine:3950] Intake/Output this shift:    General appearance: cooperative and pale Resp: diminished breath sounds bilaterally and rales bibasilar Cardio: regular rate and rhythm and systolic murmur: systolic ejection 2/6, decrescendo at 2nd left intercostal space GI: pos bs,liver down 4 cm Extremities: edema 2+  Lab Results:  Recent Labs  06/24/13 0458 06/25/13 0805  WBC 10.2 10.3  HGB 9.6* 9.8*  HCT 26.2* 25.9*  PLT 121* 152   BMET:  Recent Labs  06/25/13 2331 06/26/13 0418  NA 122* 124*  K 3.6 3.7  CL 87* 88*  CO2 29 29  GLUCOSE 121* 112*  BUN 13 13  CREATININE 0.76 0.74  CALCIUM 8.4 8.3*   No results found for this basename: PTH,  in the last 72 hours Iron Studies: No results found for this basename: IRON, TIBC, TRANSFERRIN, FERRITIN,  in the last 72 hours  Studies/Results: Mr Brain Wo Contrast  06/24/2013   CLINICAL DATA:  Post knee replacement. Episode of altered mental status with difficulty expressing himself. Question stroke  EXAM: MRI HEAD WITHOUT CONTRAST  TECHNIQUE: Multiplanar, multisequence MR imaging was performed. No intravenous contrast was administered.  COMPARISON:  11/23/2010 CT.  FINDINGS: No acute infarct.  Mild to moderate small vessel disease type changes.  No intracranial hemorrhage.  Global atrophy without hydrocephalus.  No intracranial mass lesion noted on this unenhanced exam.  Major intracranial vascular structures are patent.  Mild cervical spondylotic changes upper cervical spine.  IMPRESSION: No  acute infarct. Please see above.   Electronically Signed   By: Bridgett Larsson M.D.   On: 06/24/2013 15:42    I have reviewed the patient's current medications.  Assessment/Plan: 1  Hypervolemic hypernatremia- SIADH iatrogenic, medication, pain and fluid related.  improving with Tolvaptan. Will D/c and follow. If not better, Lasix 2 TKR 3 Prostate Ca P. Limit fluids, follow S Na    LOS: 5 days   Christiona Siddique L 06/26/2013,9:39 AM

## 2013-06-26 NOTE — Progress Notes (Signed)
[facility] PROGRESS NOTE  Craig [name] WUJ:[id number] DOB: 1926-06-17 DOA: 06/21/2013 PCP: Lorenda Peck, MD  Assessment/Plan: Hyponatremia  -severe, has trended down from 127 on 10/28 to 115 10/30  -Sodium was continued to trend down overnight and renal consulted >>patient started [facility] 10/31 And IV fluids DC'ed and sodium up to 124 this am.  -Urine sodium elevated but the patient was on HCTZ -TSH wnl, cortisol NL at 20.1 -Per renal likely secondary to hypervolemia,  BNP in 500s await echo with nl EF -Renal is managing electrolytes which are continuing to improve and pt is doing well clinically>> [facility] will s/o, please call as needed Hypokalemia  Likely due to diuretic, resolved  Dysphonia, Transient  - per Neuro, MRI negative. Await 2-D echo OA (osteoarthritis) of knee  -per primary team, s/p surgery  Postoperative anemia due to acute blood loss  -hgb stable    Code Status: Full Family Communication: family. at the bedside Disposition Plan: per primary team     Procedures:  Echocardiogram-pending  Antibiotics:  None  HPI/Subjective: Patient doing well today, ambulate with PT- denies any c/o  Objective: Filed Vitals:   06/26/13 0800  BP:   Pulse:   Temp:   Resp: 18    Intake/Output Summary (Last 24 hours) at 06/26/13 1308 Last data filed at 06/26/13 0646  Gross per 24 hour  Intake      0 ml  Output   3350 ml  Net  -3350 ml   Filed Weights   06/21/13 2100  Weight: 74.8 kg (164 lb 14.5 oz)    Exam:  General: alert & a little slow but oriented x 3 Cardiovascular: RRR, nl S1 s2 Respiratory: CTAB Abdomen: soft +BS NT/ND, no masses palpable Extremities: No cyanosis and trace RLE edema, left lower extremity with no edema   Data Reviewed: Basic Metabolic Panel:  Recent Labs Lab 06/25/13 1225 06/25/13 1610 06/25/13 2006 06/25/13 2331 06/26/13 0418  NA 116* 117* 120* 122* 124*  K 3.1* 3.6 3.2* 3.6 3.7  CL 82* 83* 85* 87* 88*   CO2 27 28 28 29 29   GLUCOSE 110* 145* 131* 121* 112*  BUN 8 11 12 13 13   CREATININE 0.76 0.69 0.78 0.76 0.74  CALCIUM 8.3* 8.1* 8.7 8.4 8.3*   Liver Function Tests:  Recent Labs Lab 06/24/13 1320  AST 20  ALT 12  ALKPHOS 46  BILITOT 0.9  PROT 5.3*  ALBUMIN 3.0*   No results found for this basename: LIPASE, AMYLASE,  in the last 168 hours No results found for this basename: AMMONIA,  in the last 168 hours CBC:  Recent Labs Lab 06/22/13 0415 06/23/13 0457 06/24/13 0458 06/25/13 0805 06/26/13 0418  WBC 16.0* 16.3* 10.2 10.3 10.4  HGB 10.5* 8.3* 9.6* 9.8* 9.3*  HCT 28.6* 22.0* 26.2* 25.9* 25.5*  MCV 95.3 93.6 89.4 87.5 91.4  PLT 158 138* 121* 152 213   Cardiac Enzymes: No results found for this basename: CKTOTAL, CKMB, CKMBINDEX, TROPONINI,  in the last 168 hours BNP (last 3 results)  Recent Labs  06/25/13 0805  PROBNP 513.9*   CBG: No results found for this basename: GLUCAP,  in the last 168 hours  Recent Results (from the past 240 hour(s))  MRSA PCR SCREENING     Status: None   Collection Time    06/25/13  1:47 AM      Result Value Range Status   MRSA by PCR NEGATIVE  NEGATIVE Final   Comment:  The GeneXpert MRSA Assay (FDA     approved for NASAL specimens     only), is one component of a     comprehensive MRSA colonization     surveillance program. It is not     intended to diagnose MRSA     infection nor to guide or     monitor treatment for     MRSA infections.     Studies: Mr Brain Wo Contrast  06/24/2013   CLINICAL DATA:  Post knee replacement. Episode of altered mental status with difficulty expressing himself. Question stroke  EXAM: MRI HEAD WITHOUT CONTRAST  TECHNIQUE: Multiplanar, multisequence MR imaging was performed. No intravenous contrast was administered.  COMPARISON:  11/23/2010 CT.  FINDINGS: No acute infarct.  Mild to moderate small vessel disease type changes.  No intracranial hemorrhage.  Global atrophy without  hydrocephalus.  No intracranial mass lesion noted on this unenhanced exam.  Major intracranial vascular structures are patent.  Mild cervical spondylotic changes upper cervical spine.  IMPRESSION: No acute infarct. Please see above.   Electronically Signed   By: Bridgett Larsson M.D.   On: 06/24/2013 15:42    Scheduled Meds: . acyclovir  200 mg Oral Daily  . aspirin  40.5 mg Oral Daily  . atorvastatin  10 mg Oral Q M,W,F  . bicalutamide  50 mg Oral q morning - 10a  . docusate sodium  100 mg Oral BID  . rivaroxaban  10 mg Oral Q breakfast   Continuous Infusions:   Principal Problem:   OA (osteoarthritis) of knee Active Problems:   Postoperative anemia due to acute blood loss   Hyponatremia   Dysphonia, Transient   TIA (transient ischemic attack)    Time spent: 25    [facility]  [facility] Pager [phone]. If 7PM-7AM, please contact night-coverage at [email or web address], password [facility] 06/26/2013, 1:08 PM  LOS: 5 days

## 2013-06-26 NOTE — Progress Notes (Signed)
Physical Therapy Treatment Patient Details Name: PENG THORSTENSON MRN: [id number] DOB: Jun 23, 1926 Today's Date: 06/26/2013 Time: 5638-7564 PT Time Calculation (min): 43 min  PT Assessment / Plan / Recommendation  History of Present Illness RTKA with post op AMS   PT Comments   POD # 5 R TKR assisted pt OOB to amb in hallway twice.  Pt feeling better and was able to progress his gait and increased activity tolerance.  Amb pt twice with one sitting rest break.  Assisted pt to bsc for BM then positioned in recliner.   Follow Up Recommendations  SNF     Does the patient have the potential to tolerate intense rehabilitation     Barriers to Discharge        Equipment Recommendations  None recommended by PT    Recommendations for Other Services    Frequency     Progress towards PT Goals    Plan      Precautions / Restrictions Precautions Precautions: Fall;Knee Precaution Comments: Instructed pt and spouse on KI use for amb Required Braces or Orthoses: Knee Immobilizer - Right Restrictions Weight Bearing Restrictions: No Other Position/Activity Restrictions: WBAT    Pertinent Vitals/Pain C/o "some" knee pain     Mobility  Bed Mobility Bed Mobility: Supine to Sit Supine to Sit: 3: Mod assist;2: Max assist Details for Bed Mobility Assistance: pt required increased time but performed better Transfers Transfers: Sit to Stand;Stand to Sit Sit to Stand: 1: +2 Total assist;From bed;From chair/3-in-1;From toilet Sit to Stand: Patient Percentage: 40% Stand to Sit: 1: +2 Total assist;To chair/3-in-1;To toilet Stand to Sit: Patient Percentage: 40% Details for Transfer Assistance: Pt continues to require + 2 total assist to rise and demon severe posterior lean. Ambulation/Gait Ambulation/Gait Assistance: 1: +2 Total assist Ambulation/Gait: Patient Percentage: 50% Ambulation Distance (Feet): 60 Feet (40 feet then another 20 feet) Assistive device: Rolling walker Ambulation/Gait  Assistance Details: 25% VC's to increase upright posture as pt demon severe posterior lean.  Pt feeling better and progressed with his gait distance and tolerance.  Gait Pattern: Step-to pattern;Antalgic;Trunk flexed Gait velocity: decreased.     PT Goals (current goals can now be found in the care plan section)    Visit Information  Last PT Received On: 06/26/13 Assistance Needed: +2 History of Present Illness: RTKA with post op AMS    Subjective Data      Cognition       Balance     End of Session PT - End of Session Equipment Utilized During Treatment: Gait belt;Right knee immobilizer Activity Tolerance: Patient tolerated treatment well Patient left: in chair;with call bell/phone within reach;with family/visitor present   [name]  PTA [facility]  Acute  Rehab Pager      [phone]

## 2013-06-26 NOTE — Progress Notes (Signed)
Subjective: 5 Days Post-Op Procedure(s) (LRB): RIGHT TOTAL KNEE ARTHROPLASTY (Right) Patient reports pain as mild.  Well controlled with oral pain meds.    Objective: Vital signs in last 24 hours: Temp:  [97.8 F (36.6 C)-98.5 F (36.9 C)] 98.3 F (36.8 C) (11/01 0645) Pulse Rate:  [70-83] 73 (11/01 0645) Resp:  [16-18] 18 (11/01 0645) BP: (115-134)/(63-77) 125/63 mmHg (11/01 0645) SpO2:  [97 %-99 %] 99 % (11/01 0645)  Intake/Output from previous day: 10/31 0701 - 11/01 0700 In: 360 [P.O.:360] Out: 3950 [Urine:3950] Intake/Output this shift:     Recent Labs  06/24/13 0458 06/25/13 0805  HGB 9.6* 9.8*    Recent Labs  06/24/13 0458 06/25/13 0805  WBC 10.2 10.3  RBC 2.93* 2.96*  HCT 26.2* 25.9*  PLT 121* 152    Recent Labs  06/25/13 2331 06/26/13 0418  NA 122* 124*  K 3.6 3.7  CL 87* 88*  CO2 29 29  BUN 13 13  CREATININE 0.76 0.74  GLUCOSE 121* 112*  CALCIUM 8.4 8.3*     PE:  R knee incision dressed and dry.  NVI at R LE.  Assessment/Plan: 5 Days Post-Op Procedure(s) (LRB): RIGHT TOTAL KNEE ARTHROPLASTY (Right) Up with therapy  WBAT on R LE.  Appreciate hospitalist and renal assistance.  Rehab placement when pt stable.  Toni Arthurs 06/26/2013, 8:21 AM

## 2013-06-26 NOTE — Progress Notes (Signed)
Physical Therapy Treatment Patient Details Name: LIZANDRO SPELLMAN MRN: [id number] DOB: [birth year] Today's Date: 06/26/2013 Time: 1450-1520 PT Time Calculation (min): 30 min  PT Assessment / Plan / Recommendation  History of Present Illness RTKA with post op AMS   PT Comments   POD # 5 pm session.  Pt's mental status has returned to base line.  Did well today with his Therapy.  Assisted pt back to bed to perform TKR TE's then applied CPM.   Follow Up Recommendations  SNF     Does the patient have the potential to tolerate intense rehabilitation     Barriers to Discharge        Equipment Recommendations  None recommended by PT    Recommendations for Other Services    Frequency 7X/week   Progress towards PT Goals Progress towards PT goals: Progressing toward goals  Plan      Precautions / Restrictions Precautions Precautions: Fall;Knee Precaution Comments: instructed pt on KI use for amb until able to perform 10 active SLR Required Braces or Orthoses: Knee Immobilizer - Right Restrictions Weight Bearing Restrictions: No Other Position/Activity Restrictions: WBAT    Pertinent Vitals/Pain "not bad"   Mobility  Bed Mobility Bed Mobility: Sit to Supine Supine to Sit: 3: Mod assist;2: Max assist Sit to Supine: 2: Max assist Details for Bed Mobility Assistance: assisted back to bed  Transfers Transfers: Sit to Stand;Stand to Sit Sit to Stand: 1: +2 Total assist;From chair/3-in-1 Sit to Stand: Patient Percentage: 40% Stand to Sit: 1: +2 Total assist;To bed Stand to Sit: Patient Percentage: 40% Details for Transfer Assistance: Pt continues to require + 2 total assist to rise and demon severe posterior lean.  Ambulation/Gait Assistive device: Rolling walker Ambulation/Gait Assistance Details: took a few side steps to bed only this afternoon    Exercises   Total Knee Replacement TE's 10 reps B LE ankle pumps 10 reps knee presses 10 reps heel slides  10 reps  SAQ's 10 reps SLR's 10 reps ABD Followed by ICE    PT Goals (current goals can now be found in the care plan section)    Visit Information  Last PT Received On: 06/26/13 Assistance Needed: +2 (for gait safety/severe posterior lean) History of Present Illness: RTKA with post op AMS    Subjective Data      Cognition       Balance     End of Session PT - End of Session Equipment Utilized During Treatment: Gait belt;Right knee immobilizer Activity Tolerance: Patient tolerated treatment well Patient left: in bed;with family/visitor present;with call bell/phone within reach CPM Right Knee CPM Right Knee: On Right Knee Flexion (Degrees): 70 Right Knee Extension (Degrees): 10 Additional Comments: on at 15:00   [name]  PTA [facility]  Acute  Rehab Pager      [phone]

## 2013-06-26 NOTE — Progress Notes (Signed)
Craig [name], midlevel notified regarding whether or not to give Samsca this am because Craig [name] sodium is now 122 this am. Will cont to monitor.

## 2013-06-27 ENCOUNTER — Inpatient Hospital Stay (HOSPITAL_COMMUNITY): Payer: Medicare Other

## 2013-06-27 LAB — RENAL FUNCTION PANEL
BUN: 15 mg/dL (ref 6–23)
CO2: 29 mEq/L (ref 19–32)
Calcium: 8.6 mg/dL (ref 8.4–10.5)
Chloride: 90 mEq/L — ABNORMAL LOW (ref 96–112)
Creatinine, Ser: 0.81 mg/dL (ref 0.50–1.35)
Glucose, Bld: 101 mg/dL — ABNORMAL HIGH (ref 70–99)
Sodium: 125 mEq/L — ABNORMAL LOW (ref 135–145)

## 2013-06-27 NOTE — Progress Notes (Signed)
Subjective: Knee pain controlled.  C/o dry cough.  No complaints of cp, sob.    Objective: Vital signs in last 24 hours: Temp:  [98 F (36.7 C)-98.2 F (36.8 C)] 98.2 F (36.8 C) (11/02 0500) Pulse Rate:  [71-79] 71 (11/02 0500) Resp:  [16-18] 16 (11/02 0500) BP: (110-121)/(62-70) 110/62 mmHg (11/02 0500) SpO2:  [99 %-100 %] 99 % (11/02 0500)  Intake/Output from previous day: 11/01 0701 - 11/02 0700 In: 120 [P.O.:120] Out: 1925 [Urine:1925] Intake/Output this shift: Total I/O In: 120 [P.O.:120] Out: -    Recent Labs  06/25/13 0805 06/26/13 0418  HGB 9.8* 9.3*    Recent Labs  06/25/13 0805 06/26/13 0418  WBC 10.3 10.4  RBC 2.96* 2.79*  HCT 25.9* 25.5*  PLT 152 213    Recent Labs  06/26/13 1627 06/27/13 0420  NA 123* 125*  K 3.6 3.4*  CL 88* 90*  CO2 29 29  BUN 14 15  CREATININE 0.70 0.81  GLUCOSE 112* 101*  CALCIUM 8.5 8.6   No results found for this basename: LABPT, INR,  in the last 72 hours  Exam:  Knee wound looks good.  No drainage or signs of infection.  Calf nt, nvi.    Assessment/Plan: Continue present care for his knee.  Will get chest xray now and compare to preop.  Dr Despina Hick to follow in morning.     OWENS,JAMES M 06/27/2013, 10:49 AM

## 2013-06-27 NOTE — Progress Notes (Signed)
Physical Therapy Treatment Patient Details Name: Craig [name] MRN: [id number] DOB: 10-31-1925 Today's Date: 06/27/2013 Time: 6578-4696 PT Time Calculation (min): 40 min  PT Assessment / Plan / Recommendation  History of Present Illness RTKA with post op AMS   PT Comments   Progressing slowly. Plan is for possible d/c to SNF on tomorrow.   Follow Up Recommendations  SNF     Does the patient have the potential to tolerate intense rehabilitation     Barriers to Discharge        Equipment Recommendations  None recommended by PT    Recommendations for Other Services    Frequency 7X/week   Progress towards PT Goals Progress towards PT goals: Progressing toward goals (slowly)  Plan Current plan remains appropriate    Precautions / Restrictions Precautions Precautions: Fall;Knee Precaution Comments: instructed pt on KI use for amb until able to perform 10 active SLR Required Braces or Orthoses: Knee Immobilizer - Right Restrictions Weight Bearing Restrictions: No Other Position/Activity Restrictions: WBAT   Pertinent Vitals/Pain 5/10 with activity. Ice applied end of session    Mobility  Bed Mobility Bed Mobility: Supine to Sit;Sit to Supine Supine to Sit: 2: Max assist Sit to Supine: 2: Max assist Details for Bed Mobility Assistance: Increased time. Assist for trunk and Les.  Transfers Transfers: Sit to Stand;Stand to Sit;Stand Pivot Transfers Sit to Stand: 1: +2 Total assist;From bed;From chair/3-in-1 Sit to Stand: Patient Percentage: 50% Stand to Sit: 1: +2 Total assist;To bed;To chair/3-in-1 Stand to Sit: Patient Percentage: 50% Stand Pivot Transfers: 3: Mod assist Details for Transfer Assistance: x 3. Assist to rise, stabilize, control descent. VCs safety, technique, hand placement.  Ambulation/Gait Ambulation/Gait Assistance: 1: +2 Total assist Ambulation/Gait: Patient Percentage: 80% Ambulation Distance (Feet): 40 Feet (x2) Assistive device: Rolling  walker Ambulation/Gait Assistance Details: Assist to stabilize throughout ambulation. Followed with recliner.  VCS safety, technique, sequence, posture. Seated rest break.  Gait Pattern: Step-to pattern;Trunk flexed;Antalgic;Decreased stride length    Exercises Total Joint Exercises Ankle Circles/Pumps: AROM;Both;15 reps;Supine Quad Sets: AROM;Both;Supine;10 reps Heel Slides: AAROM;Right;15 reps;Supine Hip ABduction/ADduction: AAROM;Right;15 reps;Supine Straight Leg Raises: AAROM;Right;15 reps;Supine   PT Diagnosis:    PT Problem List:   PT Treatment Interventions:     PT Goals (current goals can now be found in the care plan section)    Visit Information  Last PT Received On: 06/27/13 Assistance Needed: +2 History of Present Illness: RTKA with post op AMS    Subjective Data      Cognition  Cognition Arousal/Alertness: Awake/alert Behavior During Therapy: WFL for tasks assessed/performed Overall Cognitive Status: Within Functional Limits for tasks assessed    Balance     End of Session PT - End of Session Equipment Utilized During Treatment: Gait belt;Right knee immobilizer Activity Tolerance: Patient tolerated treatment well Patient left: in bed;with call bell/phone within reach;with family/visitor present CPM Right Knee CPM Right Knee: On   GP     Rebeca Alert, MPT Pager: [phone]

## 2013-06-27 NOTE — Progress Notes (Signed)
Subjective: Interval History: Much more alert than the last time I saw him.  Wants a "solid meal" "when can I go back to [facility]? '  Objective: Vital signs in last 24 hours: Temp:  [98 F (36.7 C)-98.2 F (36.8 C)] 98.2 F (36.8 C) (11/02 0500) Pulse Rate:  [71-79] 71 (11/02 0500) Resp:  [16-18] 16 (11/02 0500) BP: (110-121)/(62-70) 110/62 mmHg (11/02 0500) SpO2:  [98 %-100 %] 99 % (11/02 0500) Weight change:   Intake/Output from previous day: 11/01 0701 - 11/02 0700 In: 120 [P.O.:120] Out: 1925 [Urine:1925] Intake/Output this shift:    General appearance: cooperative and pale Resp: diminished breath sounds bilaterally and rales bibasilar Cardio: regular rate and rhythm and systolic murmur: systolic ejection 2/6, decrescendo at 2nd left intercostal space GI: pos bs,liver down 4 cm Extremities: edema 2+ better now  Lab Results:  Recent Labs  06/25/13 0805 06/26/13 0418  WBC 10.3 10.4  HGB 9.8* 9.3*  HCT 25.9* 25.5*  PLT 152 213   BMET:   Recent Labs  06/26/13 1627 06/27/13 0420  NA 123* 125*  K 3.6 3.4*  CL 88* 90*  CO2 29 29  GLUCOSE 112* 101*  BUN 14 15  CREATININE 0.70 0.81  CALCIUM 8.5 8.6   No results found for this basename: PTH,  in the last 72 hours Iron Studies: No results found for this basename: IRON, TIBC, TRANSFERRIN, FERRITIN,  in the last 72 hours  Studies/Results: No results found.  I have reviewed the patient's current medications.  Assessment/Plan: 1  Hypervolemic hypernatremia- SIADH (mild) iatrogenic, medication, pain and fluid related.  Improved with tolvaptan.  Now out of the danger range.  Tovaptan has been stopped. Right now,  he is on a fluid restriction.  I would like to watch another 24 hours to make sure is still trending in the right direction.  Hesitant to give lasix as he has had so much out lately, do not want to get him volume depleted.  BP is better 2 TKR- per otho  3 Prostate Ca P. Limit fluids, follow S Na    LOS: 6 days   Fin Hupp A 06/27/2013,7:50 AM

## 2013-06-28 LAB — BASIC METABOLIC PANEL
BUN: 14 mg/dL (ref 6–23)
CO2: 26 mEq/L (ref 19–32)
Calcium: 8.4 mg/dL (ref 8.4–10.5)
Chloride: 92 mEq/L — ABNORMAL LOW (ref 96–112)
Creatinine, Ser: 0.74 mg/dL (ref 0.50–1.35)
Glucose, Bld: 96 mg/dL (ref 70–99)
Potassium: 3.3 mEq/L — ABNORMAL LOW (ref 3.5–5.1)

## 2013-06-28 MED ORDER — SODIUM CHLORIDE 1 G PO TABS: 2.0000 g | ORAL_TABLET | Freq: Two times a day (BID) | ORAL | Status: DC

## 2013-06-28 MED ORDER — SODIUM CHLORIDE 1 G PO TABS
2.0000 g | ORAL_TABLET | Freq: Two times a day (BID) | ORAL | Status: DC
  Administered 2013-06-28 – 2013-06-29 (×2): 2 g via ORAL
  Filled 2013-06-28 (×4): qty 2

## 2013-06-28 NOTE — Progress Notes (Signed)
Subjective: Feeling better, up in chair, Na is 126 today  Objective: Vital signs in last 24 hours: Temp:  [98.5 F (36.9 C)-98.6 F (37 C)] 98.5 F (36.9 C) (11/03 0524) Pulse Rate:  [63-77] 63 (11/03 0524) Resp:  [18-20] 20 (11/03 0524) BP: (110-127)/(74-77) 127/74 mmHg (11/03 0524) SpO2:  [77 %-99 %] 99 % (11/03 0524) Weight change:   Intake/Output from previous day: 11/02 0701 - 11/03 0700 In: 120 [P.O.:120] Out: 1500 [Urine:1500] Intake/Output this shift:    General appearance: cooperative and pale Resp: clear bilat Cardio: RRR no rub or gallop GI: pos bs,liver down 4 cm Ext: no LE edema or UE edema  Lab Results:  Recent Labs  06/26/13 0418  WBC 10.4  HGB 9.3*  HCT 25.5*  PLT 213   BMET:   Recent Labs  06/27/13 0420 06/28/13 0530  NA 125* 126*  K 3.4* 3.3*  CL 90* 92*  CO2 29 26  GLUCOSE 101* 96  BUN 15 14  CREATININE 0.81 0.74  CALCIUM 8.6 8.4   No results found for this basename: PTH,  in the last 72 hours Iron Studies: No results found for this basename: IRON, TIBC, TRANSFERRIN, FERRITIN,  in the last 72 hours  Studies/Results: Dg Chest 2 View  06/27/2013   CLINICAL DATA:  Cough. Recently postop for knee surgery.  EXAM: CHEST  2 VIEW  COMPARISON:  06/15/2013  FINDINGS: Changes of COPD are again demonstrated. Calcified granuloma again seen in the left lung base. Lungs are otherwise clear. No evidence of pleural effusion. Heart size is within normal limits. No mass or lymphadenopathy identified.  IMPRESSION: Pulmonary emphysema. No active disease.   Electronically Signed   By: Myles Rosenthal M.D.   On: 06/27/2013 12:20    I have reviewed the patient's current medications.  Assessment/Plan: 1 Hypervolemic hypernatremia- SIADH (mild) iatrogenic, medication, pain and fluid related. Improved s/p tolvaptan, Na stable at 126 today.  OK for discharge to facility from renal standpoint.  Recommend continue fluid restrict 1L/day until Na over 130, and have  added NaCl tabs (2gm bid) which he should take for one week then stop.  Have facility check serum Na levels twice a week until stable.  2 TKR- per otho  3 Prostate Ca  Will sign off, please call w any questions.     Vinson Moselle MD  pager [phone]    cell [phone]  06/28/2013, 1:16 PM

## 2013-06-28 NOTE — Progress Notes (Signed)
   Subjective: 7 Days Post-Op Procedure(s) (LRB): RIGHT TOTAL KNEE ARTHROPLASTY (Right) Patient reports pain as mild.   Patient seen in rounds with Dr. Lequita Halt.  Patient looks better than end of last week. Patient is doing better today. Plan is to go Rehab Unit at [facility] after hospital stay.  Objective: Vital signs in last 24 hours: Temp:  [98.5 F (36.9 C)-98.6 F (37 C)] 98.5 F (36.9 C) (11/03 0524) Pulse Rate:  [63-77] 63 (11/03 0524) Resp:  [18-20] 20 (11/03 0524) BP: (110-127)/(74-77) 127/74 mmHg (11/03 0524) SpO2:  [77 %-99 %] 99 % (11/03 0524)  Intake/Output from previous day:  Intake/Output Summary (Last 24 hours) at 06/28/13 1234 Last data filed at 06/28/13 0525  Gross per 24 hour  Intake      0 ml  Output   1500 ml  Net  -1500 ml    Intake/Output this shift:    Labs:  Recent Labs  06/26/13 0418  HGB 9.3*    Recent Labs  06/26/13 0418  WBC 10.4  RBC 2.79*  HCT 25.5*  PLT 213    Recent Labs  06/27/13 0420 06/28/13 0530  NA 125* 126*  K 3.4* 3.3*  CL 90* 92*  CO2 29 26  BUN 15 14  CREATININE 0.81 0.74  GLUCOSE 101* 96  CALCIUM 8.6 8.4   No results found for this basename: LABPT, INR,  in the last 72 hours  EXAM General - Patient is Alert and Appropriate Extremity - Neurovascular intact Sensation intact distally Dorsiflexion/Plantar flexion intact Dressing/Incision - clean, dry, no drainage, healing Motor Function - intact, moving foot and toes well on exam.   Past Medical History  Diagnosis Date  . Hypertension   . Degenerative disc disease   . Degenerative joint disease   . Genital herpes   . Diverticulitis   . Prostate cancer     Assessment/Plan: 7 Days Post-Op Procedure(s) (LRB): RIGHT TOTAL KNEE ARTHROPLASTY (Right) Principal Problem:   OA (osteoarthritis) of knee Active Problems:   Postoperative anemia due to acute blood loss   Hyponatremia   Dysphonia, Transient   TIA (transient ischemic  attack)  Estimated body mass index is 23.66 kg/(m^2) as calculated from the following:   Height as of this encounter: 5\' 10"  (1.778 m).   Weight as of this encounter: 74.8 kg (164 lb 14.5 oz). Up with therapy Discharge to SNF once he is cleared by medicine and renal.  DVT Prophylaxis - Xarelto and Aspirin Weight-Bearing as tolerated to right leg The aspirin was resumed following the TIA episode. Continues on Xarelto. Will resume Plavix as soon as the Xarelto is completed.  Na+ has been slowly increasing over the weekend.  Craig [name] 06/28/2013, 12:34 PM

## 2013-06-28 NOTE — Progress Notes (Signed)
Physical Therapy Treatment Patient Details Name: Craig [name] MRN: [id number] DOB: 04/03/1926 Today's Date: 06/28/2013 Time: 4696-2952 PT Time Calculation (min): 39 min  PT Assessment / Plan / Recommendation  History of Present Illness RTKA with post op AMS   PT Comments   POD # 7 pt ready to D/C to [facility] for ST Rehab.  Pt OOB in recliner feeing better but still fatigued.  Amb in hallway then performed TKR TE's.     Follow Up Recommendations  SNF     Does the patient have the potential to tolerate intense rehabilitation     Barriers to Discharge        Equipment Recommendations       Recommendations for Other Services    Frequency 7X/week   Progress towards PT Goals Progress towards PT goals: Progressing toward goals  Plan      Precautions / Restrictions Precautions Precautions: Fall;Knee Precaution Comments: D/C KI 2nd able to perform 10 SLR Restrictions Weight Bearing Restrictions: No Other Position/Activity Restrictions: WBAT    Pertinent Vitals/Pain C/o no pain    Mobility  Bed Mobility Bed Mobility: Not assessed Details for Bed Mobility Assistance: Pt OOB in recliner Transfers Transfers: Sit to Stand;Stand to Sit Sit to Stand: 2: Max assist Stand to Sit: 3: Mod assist Details for Transfer Assistance: Max assist to rise out of recliner with 50% VC's obn proper hand placement and tech. Severe posterior lean. Ambulation/Gait Ambulation/Gait Assistance: 3: Mod assist Ambulation Distance (Feet): 22 Feet Assistive device: Rolling walker Ambulation/Gait Assistance Details: decreased amb distance this session due to increased c/o of fatigue.   Gait Pattern: Step-to pattern;Trunk flexed;Antalgic;Decreased stride length Gait velocity: decreased.    Exercises   Total Knee Replacement TE's 10 reps B LE ankle pumps 10 reps knee presses 10 reps heel slides  10 reps SAQ's 10 reps SLR's 10 reps ABD Followed by ICE     PT Goals (current goals can now  be found in the care plan section)    Visit Information  Last PT Received On: 06/28/13 Assistance Needed: +1 History of Present Illness: RTKA with post op AMS    Subjective Data      Cognition       Balance     End of Session PT - End of Session Equipment Utilized During Treatment: Gait belt Activity Tolerance: Patient tolerated treatment well Patient left: in chair;with call bell/phone within reach;with family/visitor present   [name]  PTA [facility]  Acute  Rehab Pager      [phone]

## 2013-06-29 LAB — BASIC METABOLIC PANEL
BUN: 17 mg/dL (ref 6–23)
Calcium: 8.4 mg/dL (ref 8.4–10.5)
Chloride: 96 mEq/L (ref 96–112)
GFR calc Af Amer: >90 mL/min (ref >90)
GFR calc non Af Amer: 85 mL/min — ABNORMAL LOW (ref >90)
Potassium: 3.6 mEq/L (ref 3.5–5.1)

## 2013-06-29 MED ORDER — ONDANSETRON HCL 4 MG PO TABS: 4.0000 mg | ORAL_TABLET | Freq: Four times a day (QID) | ORAL | Status: DC | PRN

## 2013-06-29 MED ORDER — POLYETHYLENE GLYCOL 3350 17 G PO PACK: 17.0000 g | PACK | Freq: Every day | ORAL | Status: DC | PRN

## 2013-06-29 MED ORDER — RIVAROXABAN 10 MG PO TABS: 10.0000 mg | ORAL_TABLET | Freq: Every day | ORAL | Status: DC

## 2013-06-29 MED ORDER — BISACODYL 10 MG RE SUPP: 10.0000 mg | Freq: Every day | RECTAL | Status: DC | PRN

## 2013-06-29 MED ORDER — OXYCODONE HCL 5 MG PO TABS: 5.0000 mg | ORAL_TABLET | ORAL | Status: DC | PRN

## 2013-06-29 MED ORDER — ALUM & MAG HYDROXIDE-SIMETH 200-200-20 MG/5ML PO SUSP: 30.0000 mL | Freq: Four times a day (QID) | ORAL | Status: DC | PRN

## 2013-06-29 MED ORDER — DSS 100 MG PO CAPS: 100.0000 mg | ORAL_CAPSULE | Freq: Two times a day (BID) | ORAL | Status: DC

## 2013-06-29 MED ORDER — SODIUM CHLORIDE 1 G PO TABS: 2.0000 g | ORAL_TABLET | Freq: Two times a day (BID) | ORAL | Status: DC

## 2013-06-29 MED ORDER — TRAMADOL HCL 50 MG PO TABS: 50.0000 mg | ORAL_TABLET | Freq: Four times a day (QID) | ORAL | Status: DC | PRN

## 2013-06-29 MED ORDER — ACETAMINOPHEN 325 MG PO TABS: 650.0000 mg | ORAL_TABLET | Freq: Four times a day (QID) | ORAL | Status: DC | PRN

## 2013-06-29 NOTE — Progress Notes (Signed)
Physical Therapy Treatment Patient Details Name: Craig [name] MRN: [id number] DOB: Jun 03, 1926 Today's Date: 06/29/2013 Time: 4098-1191 PT Time Calculation (min): 24 min  PT Assessment / Plan / Recommendation  History of Present Illness RTKA with post op AMS   PT Comments   POD # 8 pt plans to D/C to SNF today for ST Rehab.  Performed TKR TE's as pt declined to get OOB to rest for his trip to Rehab.  Follow Up Recommendations        Does the patient have the potential to tolerate intense rehabilitation     Barriers to Discharge        Equipment Recommendations       Recommendations for Other Services    Frequency     Progress towards PT Goals Progress towards PT goals: Progressing toward goals  Plan      Precautions / Restrictions Precautions Precautions: Fall;Knee Precaution Comments: D/C KI 2nd able to perform 10 SLR Restrictions Weight Bearing Restrictions: No Other Position/Activity Restrictions: WBAT    Pertinent Vitals/Pain No c/o pain    Mobility    Tx session focused on TKR TE's   Exercises   Total Knee Replacement TE's 10 reps B LE ankle pumps 10 reps knee presses 10 reps heel slides  10 reps SAQ's 10 reps SLR's 10 reps ABD Followed by ICE   PT Goals (current goals can now be found in the care plan section)    Visit Information  Last PT Received On: 06/29/13 Assistance Needed: +1 History of Present Illness: RTKA with post op AMS    Subjective Data      Cognition       Balance     End of Session  Pt in bed with call light in reach and visitor   [name]  PTA [facility]  Acute  Rehab Pager      [phone]

## 2013-06-29 NOTE — Progress Notes (Signed)
Report called to [facility], [place].  Phone [phone].  Pt en route via [facility]. Ardyth Gal, RN 06/29/2013

## 2013-06-29 NOTE — Progress Notes (Signed)
[facility] called for pickup (Service Request Id: [id number]).   Unice Bailey, LCSW [facility] Clinical Social Worker cell #: [phone]

## 2013-06-29 NOTE — Progress Notes (Signed)
Patient is set to discharge to [facility] SNF today. Patient & family at bedside aware. Discharge packet in [place], [place] - Verlon Au aware. [facility] to be called for pickup once finished with lunch.   Unice Bailey, LCSW [facility] Clinical Social Worker cell #: [phone]

## 2013-06-29 NOTE — Progress Notes (Signed)
Subjective: 8 Days Post-Op Procedure(s) (LRB): RIGHT TOTAL KNEE ARTHROPLASTY (Right) Patient reports pain as mild.   Patient seen in rounds for Dr. Lequita Halt. Patient is well, and has had no acute complaints or problems Patient is ready to go to the Rehab Unit at [facility]. His sodium is improving and Renal signed off yesterday.  He has been cleared from their standpoint to transfer to rehab.  Objective: Vital signs in last 24 hours: Temp:  [98.2 F (36.8 C)-99.5 F (37.5 C)] 98.4 F (36.9 C) (11/04 4098) Pulse Rate:  [66-81] 66 (11/04 0632) Resp:  [18-20] 20 (11/04 1191) BP: (116-125)/(56-76) 125/76 mmHg (11/04 0632) SpO2:  [97 %-100 %] 97 % (11/04 4782) Weight:  [88.1 kg (194 lb 3.6 oz)] 88.1 kg (194 lb 3.6 oz) (11/04 9562)  Intake/Output from previous day:  Intake/Output Summary (Last 24 hours) at 06/29/13 0806 Last data filed at 06/29/13 1308  Gross per 24 hour  Intake    100 ml  Output    650 ml  Net   -550 ml    Labs: No results found for this basename: HGB,  in the last 72 hours No results found for this basename: WBC, RBC, HCT, PLT,  in the last 72 hours  Recent Labs  06/28/13 0530 06/29/13 0355  NA 126* 130*  K 3.3* 3.6  CL 92* 96  CO2 26 27  BUN 14 17  CREATININE 0.74 0.68  GLUCOSE 96 103*  CALCIUM 8.4 8.4   No results found for this basename: LABPT, INR,  in the last 72 hours  EXAM: General - Patient is Alert, Appropriate and Oriented Extremity - Neurovascular intact Sensation intact distally Dorsiflexion/Plantar flexion intact Incision - clean, dry, no drainage, healing Motor Function - intact, moving foot and toes well on exam.  Able to do SLR's  Assessment/Plan: 8 Days Post-Op Procedure(s) (LRB): RIGHT TOTAL KNEE ARTHROPLASTY (Right) Procedure(s) (LRB): RIGHT TOTAL KNEE ARTHROPLASTY (Right) Past Medical History  Diagnosis Date  . Hypertension   . Degenerative disc disease   . Degenerative joint disease   . Genital herpes   .  Diverticulitis   . Prostate cancer    Principal Problem:   OA (osteoarthritis) of knee Active Problems:   Postoperative anemia due to acute blood loss   Hyponatremia   Dysphonia, Transient   TIA (transient ischemic attack)  Estimated body mass index is 27.87 kg/(m^2) as calculated from the following:   Height as of this encounter: 5\' 10"  (1.778 m).   Weight as of this encounter: 88.1 kg (194 lb 3.6 oz). Up with therapy Discharge to SNF - Rehab Unit at [facility] Diet - Normally a cardiac diet but is on fluid restrictions for hypervolemic hypernatremia Recommend continued fluid restrict 1L/day until Na over 130  Follow up - in one more week, next week int he office. Activity - WBAT Disposition - Rehab Unit at [facility] Condition Upon Discharge - Stable, Na+ improving.  Stable from an ortho standpoint D/C Meds - See DC Summary DVT Prophylaxis - Aspirin and Xarelto.  The aspirin was resumed following the TIA episode. Continue on Xarelto. Will resume Plavix as soon as the Xarelto is completed.   Renal Recommendations: Hypervolemic hypernatremia- SIADH (mild) iatrogenic, medication, pain and fluid related. Improved s/p tolvaptan, Na stable at 126 today. OK for discharge to facility from renal standpoint. Recommend continue fluid restrict 1L/day until Na over 130, and have added NaCl tabs (2gm bid) which he should take for  one week then stop. Have facility check serum Na levels twice a week until stable.    Graceanne Guin 06/29/2013, 8:06 AM

## 2013-06-29 NOTE — Discharge Summary (Signed)
Physician Discharge Summary   Patient ID: Craig [name] MRN: [id number] DOB/AGE: 1925/11/10 79 y.o.  Admit date: 06/21/2013 Discharge date: 30 / 4 / 2014  Primary Diagnosis:  Osteoarthritis Right knee(s)  Admission Diagnoses:  Past Medical History  Diagnosis Date  . Hypertension   . Degenerative disc disease   . Degenerative joint disease   . Genital herpes   . Diverticulitis   . Prostate cancer    Discharge Diagnoses:   Principal Problem:   OA (osteoarthritis) of knee Active Problems:   Postoperative anemia due to acute blood loss   Hyponatremia   Dysphonia, Transient   TIA (transient ischemic attack)  Estimated body mass index is 27.87 kg/(m^2) as calculated from the following:   Height as of this encounter: 5\' 10"  (1.778 m).   Weight as of this encounter: 88.1 kg (194 lb 3.6 oz).  Procedure:  Procedure(s) (LRB): RIGHT TOTAL KNEE ARTHROPLASTY (Right)   Consults: neurology and medicine  HPI: Craig [name] is a 79 y.o. year old male with end stage OA of his right knee with progressively worsening pain and dysfunction. He has constant pain, with activity and at rest and significant functional deficits with difficulties even with ADLs. He has had extensive non-op management including analgesics, injections of cortisone and viscosupplements, and home exercise program, but remains in significant pain with significant dysfunction. Radiographs show bone on bone arthritis lateral and patellofemoral. He presents now for right Total Knee Arthroplasty.   Laboratory Data: Admission on 06/21/2013  No results displayed because visit has over 200 results.    Hospital Outpatient Visit on 06/15/2013  Component Date Value Range Status  . aPTT 06/15/2013 32  24 - 37 seconds Final  . WBC 06/15/2013 7.2  4.0 - 10.5 K/uL Final  . RBC 06/15/2013 4.01* 4.22 - 5.81 MIL/uL Final  . Hemoglobin 06/15/2013 14.0  13.0 - 17.0 g/dL Final  . HCT 52/84/1324 38.6* 39.0 - 52.0 % Final  . MCV  06/15/2013 96.3  78.0 - 100.0 fL Final  . MCH 06/15/2013 34.9* 26.0 - 34.0 pg Final  . MCHC 06/15/2013 36.3* 30.0 - 36.0 g/dL Final  . RDW 40/05/2724 12.4  11.5 - 15.5 % Final  . Platelets 06/15/2013 241  150 - 400 K/uL Final  . Sodium 06/15/2013 134* 135 - 145 mEq/L Final  . Potassium 06/15/2013 4.4  3.5 - 5.1 mEq/L Final  . Chloride 06/15/2013 95* 96 - 112 mEq/L Final  . CO2 06/15/2013 31  19 - 32 mEq/L Final  . Glucose, Bld 06/15/2013 78  70 - 99 mg/dL Final  . BUN 36/64/4034 18  6 - 23 mg/dL Final  . Creatinine, Ser 06/15/2013 0.98  0.50 - 1.35 mg/dL Final  . Calcium 74/25/9563 10.2  8.4 - 10.5 mg/dL Final  . Total Protein 06/15/2013 6.9  6.0 - 8.3 g/dL Final  . Albumin 87/56/4332 4.3  3.5 - 5.2 g/dL Final  . AST 95/18/8416 22  0 - 37 U/L Final  . ALT 06/15/2013 16  0 - 53 U/L Final  . Alkaline Phosphatase 06/15/2013 39  39 - 117 U/L Final  . Total Bilirubin 06/15/2013 0.5  0.3 - 1.2 mg/dL Final  . GFR calc non Af Amer 06/15/2013 73* >90 mL/min Final  . GFR calc Af Amer 06/15/2013 85* >90 mL/min Final   Comment: (NOTE)                          The eGFR  has been calculated using the CKD EPI equation.                          This calculation has not been validated in all clinical situations.                          eGFR's persistently <90 mL/min signify possible Chronic Kidney                          Disease.  Marland Kitchen Prothrombin Time 06/15/2013 13.4  11.6 - 15.2 seconds Final  . INR 06/15/2013 1.04  0.00 - 1.49 Final  . ABO/RH(D) 06/15/2013 A POS   Final  . Antibody Screen 06/15/2013 NEG   Final  . Sample Expiration 06/15/2013 06/24/2013   Final  . Color, Urine 06/15/2013 YELLOW  YELLOW Final  . APPearance 06/15/2013 CLEAR  CLEAR Final  . Specific Gravity, Urine 06/15/2013 1.016  1.005 - 1.030 Final  . pH 06/15/2013 7.0  5.0 - 8.0 Final  . Glucose, UA 06/15/2013 NEGATIVE  NEGATIVE mg/dL Final  . Hgb urine dipstick 06/15/2013 NEGATIVE  NEGATIVE Final  . Bilirubin Urine 06/15/2013  NEGATIVE  NEGATIVE Final  . Ketones, ur 06/15/2013 NEGATIVE  NEGATIVE mg/dL Final  . Protein, ur 16/05/9603 NEGATIVE  NEGATIVE mg/dL Final  . Urobilinogen, UA 06/15/2013 1.0  0.0 - 1.0 mg/dL Final  . Nitrite 54/04/8118 NEGATIVE  NEGATIVE Final  . Leukocytes, UA 06/15/2013 NEGATIVE  NEGATIVE Final   MICROSCOPIC NOT DONE ON URINES WITH NEGATIVE PROTEIN, BLOOD, LEUKOCYTES, NITRITE, OR GLUCOSE <1000 mg/dL.  Marland Kitchen MRSA, PCR 06/15/2013 NEGATIVE  NEGATIVE Final  . Staphylococcus aureus 06/15/2013 POSITIVE* NEGATIVE Final   Comment:                                 The Xpert SA Assay (FDA                          approved for NASAL specimens                          in patients over 41 years of age),                          is one component of                          a comprehensive surveillance                          program.  Test performance has                          been validated by [employer] for patients greater                          than or equal to 29 year old.  It is not intended                          to diagnose infection nor to                          guide or monitor treatment.  . ABO/RH(D) 06/15/2013 A POS   Final     X-Rays:Dg Chest 2 View  06/27/2013   CLINICAL DATA:  Cough. Recently postop for knee surgery.  EXAM: CHEST  2 VIEW  COMPARISON:  06/15/2013  FINDINGS: Changes of COPD are again demonstrated. Calcified granuloma again seen in the left lung base. Lungs are otherwise clear. No evidence of pleural effusion. Heart size is within normal limits. No mass or lymphadenopathy identified.  IMPRESSION: Pulmonary emphysema. No active disease.   Electronically Signed   By: Myles Rosenthal M.D.   On: 06/27/2013 12:20   Dg Chest 2 View  06/15/2013   CLINICAL DATA:  Preoperative for knee surgery.  EXAM: CHEST  2 VIEW  COMPARISON:  11/23/2010  FINDINGS: Healed fractures right anterior 5th, 6th, and 7th ribs. Chronic blunting of the  left costophrenic angle. Old granulomatous disease on the left.  Mild thoracolumbar scoliosis noted with associated spondylosis. Emphysema is present. Cardiac and mediastinal margins appear normal. Severe degenerative glenohumeral arthropathy on the left.  IMPRESSION: 1. No acute findings. 2. Thoracolumbar scoliosis and spondylosis. 3. Emphysema. 4. Severe left degenerative glenohumeral arthropathy. 5. Chronic blunting of the left lateral costophrenic angle due to scarring. 6. Old granulomatous disease.   Electronically Signed   By: Herbie Baltimore M.D.   On: 06/15/2013 09:46   Mr Brain Wo Contrast  06/24/2013   CLINICAL DATA:  Post knee replacement. Episode of altered mental status with difficulty expressing himself. Question stroke  EXAM: MRI HEAD WITHOUT CONTRAST  TECHNIQUE: Multiplanar, multisequence MR imaging was performed. No intravenous contrast was administered.  COMPARISON:  11/23/2010 CT.  FINDINGS: No acute infarct.  Mild to moderate small vessel disease type changes.  No intracranial hemorrhage.  Global atrophy without hydrocephalus.  No intracranial mass lesion noted on this unenhanced exam.  Major intracranial vascular structures are patent.  Mild cervical spondylotic changes upper cervical spine.  IMPRESSION: No acute infarct. Please see above.   Electronically Signed   By: Bridgett Larsson M.D.   On: 06/24/2013 15:42    EKG: Orders placed during the hospital encounter of 06/15/13  . EKG 12-LEAD  . EKG 12-LEAD     Hospital Course: Craig [name] is a 79 y.o. who was admitted to [facility]. They were brought to the operating room on 06/21/2013 and underwent Procedure(s): RIGHT TOTAL KNEE ARTHROPLASTY.  Patient tolerated the procedure well and was later transferred to the recovery room and then to the orthopaedic floor for postoperative care.  They were given PO and IV analgesics for pain control following their surgery.  They were given 24 hours of postoperative antibiotics of    Anti-infectives   Start     Dose/Rate Route Frequency Ordered Stop   06/22/13 1000  acyclovir (ZOVIRAX) 200 MG capsule 200 mg     200 mg Oral Daily 06/21/13 1850     06/22/13 0600  vancomycin (VANCOCIN) IVPB 1000 mg/200 mL premix     1,000 mg 200 mL/hr over 60 Minutes Intravenous On call to O.R. 06/21/13 1255 06/21/13 1616   06/21/13 2200  ceFAZolin (ANCEF) IVPB 1 g/50 mL premix  Status:  Discontinued     1 g 100 mL/hr over 30 Minutes Intravenous Every 6 hours 06/21/13 1850 06/21/13 1938   06/21/13 2200  vancomycin (VANCOCIN) IVPB 1000 mg/200 mL premix     1,000 mg 200 mL/hr over 60 Minutes Intravenous  Once 06/21/13 1939 06/21/13 2345     and started on DVT prophylaxis in the form of Xarelto.   PT and OT were ordered for total joint protocol.  Discharge planning consulted to help with postop disposition and equipment needs.  Patient had a decnt night on the evening of surgery but was not able to get any sleep.  They started to get up OOB with therapy on day one walking about 16 feet. Hemovac drain was pulled without difficulty.    POD 2  06/23/2013 - Continued to work with therapy into day two.  Dressing was changed on day two and the incision was healing well.    POD 3  06/24/2013 - On day three, Dr. Lequita Halt was notified of events from the evening before.  Patient was doing okay that morning but did have an issue that night before.  The nurse from that night was notified that the patient was having some issues with speech by the patient's girlfriend. The RN assessed the patient and found him to have trouble with words which was also witnessed by significant other in room. The whole episode last about 20 minutes as per the staff. The only change in the patient that was observed was his speech. This was relayed to Dr. Lequita Halt the morning of day three on rounds. He was evaluated and found to be grossly intact to his faculties. He reminded Dr. Lequita Halt that he had a TIA within the past month while  at lunch with friends. He stated that he saw Dr. Anne Hahn. He was taking Plavix and Aspirin prior to surgery. He was placed on Xarelto for DVT prophylaxis and was scheduled to resume to the Plavix and ASA therapy the day following completion of the Xarelto.  Due to the episode described from the evening before, Dr. Lequita Halt requested a Neuro Consult in the hospital prior to releasing the patient.  Neurology Consult: Assessment: 79 y.o. male with recent right TKA on 06-22-13. On the night of 06-23-13 he was noted to have 10-20 minute period of transient altered mental status and expressive difficulties. Back in September patient had similar episode of difficulty finding words but no AMS was involved. In addition, patients sodium has decreased from 128-120, and K+ has decreased from 3.6 to 2.9. Given symptoms TIA is likely, as with hyponatremia, one would expect a more persistent AMS. At present time ASA has been added to [place].  Recommend:  1) continue ASA with Gibson Ramp and when Gibson Ramp has been discontinued restart back on home regime of Plavix  2) I have talked to Eber Jones for [employer] and recommended IM consult for hyponatremia and hypokalemia.  3) TIA work up including MRI brain, FLP, A1c, Echo  I have seen and evaluated the patient. I have reviewed the above note and made appropriate changes. He has been off of his typical antiplatelet regimen(plavix only) and the plan was to restart this after xarelto therapy was finished. I would favor restarting antiplatelet therapy at this time, could use ASA with xarelto instead of plavix, but would go back to plavix monotherapy once xarelto is finished.  Ritta Slot, MD  [facility] MRI was ordered and showed. EXAM:  MRI HEAD WITHOUT CONTRAST  COMPARISON: 11/23/2010 CT.  FINDINGS:  No acute infarct. Mild to moderate small vessel disease type changes. No intracranial hemorrhage. Global atrophy without hydrocephalus. No  intracranial mass lesion noted on this unenhanced exam. Major intracranial vascular structures are patent. Mild cervical spondylotic changes upper cervical spine.  IMPRESSION:  No acute infarct. Please see above. He was found also to have worsenng sodium therefore a medicine consult was called into the [facility]. Requesting physician: Dr Lequita Halt  Date of consultation: 10/30  Reason for consultation: hyponatremia and hypokalemia  Impression/Recommendations  Principal Problem:  Hyponatremia  -severe, has trended down from 127 on 10/28 to 115 today  -likely due to volume depletion and HCTZ(50MG )- pt was on this through today- have dc'ed  - he is alert and oriented at this time so for now will start hydration with NS and monitor Bmet Q4 X3 for now, follow and if not improving or new symptoms will recommend transfer to unit and renal consultation  -obtain urine lytes(though will not be very helpful if elevated as pt has been on diuretic)  -will also obtain TSH, cortisol and follow  -likely etiology of N/V  Hypokalemia  Likely due to diuretic, replace k  Dysphonia, Transient  - Neuro Consulted per primary team  OA (osteoarthritis) of knee  -per primary team, s/p surgery  Postoperative anemia due to acute blood loss  -hgb stable at 9.6 today  POD 4  06/25/2013 - Renal Services were also consulted due to the electrolyte imbalance. Assessment/Plan: 79 year old white male with hyponatremia that actually predated his surgery but has progressively declined since surgery and now in a dangerous range.  1. Hyponatremia- I agree with discontinuing the HCTZ, that was probably contributing to this picture. I also am still suspicious that he may have gotten too volume overloaded in the postoperative period given his exam. I have given 1 dose of samsca which has led to appropriate increase in urine output. I hope that this will improve the sodium level as well. He needs to be monitored very  frequently at least over the next 24 hours. I've written for checks every 4 hours. As the rest of his workup is revealed we may have more ideas. I will follow up on sodium level today for further plans question if patient will need another dose of samsca or if we could just start him on Lasix. If sodium doesn't show signs of improvement at the next check and he may need 3% saline as well.  2. decreased mental status- I am sure her sodium has something to do with this as well. However is difficult to tease out in an elderly man in the postoperative period with narcotics and anxiolytics appropriately given for pain control.  3. Hypokalemia- agree with IV repletion.  Thank you for this consultation. We will continue to follow this patient closely with you.  GOLDSBOROUGH,KELLIE A Medicine continued to follow also and noted that the sodium was up to 117 that morning. 2-D Echo was also ordered as per Neuro and recommended continued ASA with Gibson Ramp and when Gibson Ramp has been discontinued restart back on home regime of Plavix Diuresing as per Renal for the probable volume overload. Responded to the samsca.  POD 5 06/26/2013 - He remained in the hospital over the weekend and was followed by Dr. Deri Fuelling partners. Renal - Hypervolemic hypernatremia- SIADH iatrogenic, medication, pain and fluid related. improving with Tolvaptan. Will D/c and follow. If not better, Lasix He was placed on fluid restrictions.  [facility] signed  off due to management as per the Renal service and continued improved by the patient.  POD 6 06/27/2013 - Renal - Hypervolemic hypernatremia- SIADH (mild) iatrogenic, medication, pain and fluid related. Improved with tolvaptan. Now out of the danger range. Tovaptan has been stopped. Right now, he is on a fluid restriction. I would like to watch another 24 hours to make sure is still trending in the right direction. Hesitant to give lasix as he has had so much out lately, do not want to  get him volume depleted. BP is better From an ortho standpoint, the knee was doing well and healing.  No signs of infection.  He did have a dry cough so a CXR was ordered. EXAM:  CHEST 2 VIEW  COMPARISON: 06/15/2013  FINDINGS:  Changes of COPD are again demonstrated. Calcified granuloma again seen in the left lung base. Lungs are otherwise clear. No evidence of pleural effusion. Heart size is within normal limits. No mass or lymphadenopathy identified.  IMPRESSION:  Pulmonary emphysema. No active disease.  POD 7 06/28/2013 - Patient was seen in rounds with Dr. Lequita Halt. Patient looked better than end of last week.  Patient wass doing better today. Plan was to go Rehab Unit at [facility] after hospital stay. Na+ was up to 126 that morning.  The aspirin was resumed following the TIA episode. Continued on Xarelto. Resume Plavix as soon as the Xarelto is completed at the rehab unit or at home. Renal - Hypervolemic hypernatremia- SIADH (mild) iatrogenic, medication, pain and fluid related. Improved s/p tolvaptan, Na stable at 126 today. OK for discharge to facility from renal standpoint. Recommend continue fluid restrict 1L/day until Na over 130, and have added NaCl tabs (2gm bid) which he should take for one week then stop. Have facility check serum Na levels twice a week until stable.   POD 8 06/29/2013 - Patient seen in rounds for Dr. Lequita Halt. Patient was well, and has had no acute complaints or problems  Patient was ready to go to the Rehab Unit at [facility]. His sodium was improving and Renal signed off the day before. He has been cleared from their standpoint to transfer to rehab.  Transfer for continued total knee care and also close monitoring of the low sodium. Will have labs drawn twice a week and have then sent to Dr. Kathrene Bongo at [facility] and Dr. Lequita Halt.  Discharge Medications: Prior to Admission medications   Medication Sig Start Date End Date Taking?  Authorizing Provider  acyclovir (ZOVIRAX) 200 MG capsule Take 200 mg by mouth daily.  08/27/11  Yes Historical Provider, MD  amLODipine (NORVASC) 5 MG tablet Take 5 mg by mouth every morning.    Yes Historical Provider, MD  atorvastatin (LIPITOR) 10 MG tablet Take 10 mg by mouth every Monday, Wednesday, and Friday.  04/23/12  Yes Historical Provider, MD  hydrochlorothiazide (HYDRODIURIL) 50 MG tablet Take 50 mg by mouth every morning.  08/27/11  Yes Historical Provider, MD  LORazepam (ATIVAN) 0.5 MG tablet Take 0.5 mg by mouth at bedtime.  04/29/13  Yes Historical Provider, MD  acetaminophen (TYLENOL) 325 MG tablet Take 2 tablets (650 mg total) by mouth every 6 (six) hours as needed. 06/29/13   Alexzandrew Perkins, PA-C  alum & mag hydroxide-simeth (MAALOX/MYLANTA) 200-200-20 MG/5ML suspension Take 30 mLs by mouth every 6 (six) hours as needed. 06/29/13   Alexzandrew Julien Girt, PA-C  aspirin EC 81 MG tablet Take 40.5 mg by mouth every morning.  Historical Provider, MD  bisacodyl (DULCOLAX) 10 MG suppository Place 1 suppository (10 mg total) rectally daily as needed. 06/29/13   Alexzandrew Perkins, PA-C  docusate sodium 100 MG CAPS Take 100 mg by mouth 2 (two) times daily. 06/29/13   Alexzandrew Perkins, PA-C  lisinopril (PRINIVIL,ZESTRIL) 40 MG tablet Take 40 mg by mouth every morning.  08/27/11   Historical Provider, MD  ondansetron (ZOFRAN) 4 MG tablet Take 1 tablet (4 mg total) by mouth every 6 (six) hours as needed for nausea. 06/29/13   Alexzandrew Perkins, PA-C  oxyCODONE (OXY IR/ROXICODONE) 5 MG immediate release tablet Take 1-2 tablets (5-10 mg total) by mouth every 3 (three) hours as needed. 06/29/13   Alexzandrew Perkins, PA-C  polyethylene glycol (MIRALAX / GLYCOLAX) packet Take 17 g by mouth daily as needed. 06/29/13   Alexzandrew Julien Girt, PA-C  rivaroxaban (XARELTO) 10 MG TABS tablet Take 1 tablet (10 mg total) by mouth daily with breakfast. Take Xarelto for two and a more weeks, then discontinue  Xarelto. **Please note, the patient should resume his Plavix the very next day after finishing the Xarelto medication.** 06/29/13   Alexzandrew Perkins, PA-C  sodium chloride 1 G tablet Take 2 tablets (2 g total) by mouth 2 (two) times daily with a meal. Please continue the NaCL tablets until the Serum Sodium level is back within normal range, then discontinue the sodium tablets.  Na+ levels to be drawn twice a week until normal. 06/29/13   Alexzandrew Perkins, PA-C  traMADol (ULTRAM) 50 MG tablet Take 1-2 tablets (50-100 mg total) by mouth every 6 (six) hours as needed (mild pain). 06/29/13   Alexzandrew Julien Girt, PA-C   Discharge to SNF - Rehab Unit at [facility]  Diet - Normally a cardiac diet but is on fluid restrictions for hypervolemic hypernatremia  Recommend continued fluid restrict 1L/day until Na over 130   Follow up - in one more week, next week in the office.  Activity - WBAT  Disposition - Rehab Unit at [facility]  Condition Upon Discharge - Stable, Na+ improving. Stable from an ortho standpoint  D/C Meds - See DC Summary  DVT Prophylaxis - Aspirin and Xarelto. The aspirin was resumed following the TIA episode. Continue on Xarelto. Will resume Plavix as soon as the Xarelto is completed.   Renal Recommendations:  Hypervolemic hypernatremia- SIADH (mild) iatrogenic, medication, pain and fluid related. Improved s/p tolvaptan, Na stable at 126 today. OK for discharge to facility from renal standpoint. Recommend continue fluid restrict 1L/day until Na over 130, and have added NaCl tabs (2gm bid) which he should take for one week then stop. Have facility check serum Na levels twice a week until stable.    Discharge Orders   Future Appointments Provider Department Dept Phone   07/20/2013 9:00 AM Delcie Roch [facility] [phone]   07/20/2013 9:30 AM Benjiman Core, MD [facility] [phone]   Future Orders  Complete By Expires   Call MD / Call 911  As directed    Comments:     If you experience chest pain or shortness of breath, CALL 911 and be transported to the hospital emergency room.  If you develope a fever above 101 F, pus (white drainage) or increased drainage or redness at the wound, or calf pain, call your surgeon's office.   Change dressing  As directed    Comments:     Change dressing daily with sterile 4 x  4 inch gauze dressing and apply TED hose. Do not submerge the incision under water.   Constipation Prevention  As directed    Comments:     Drink plenty of fluids.  Prune juice may be helpful.  You may use a stool softener, such as Colace (over the counter) 100 mg twice a day.  Use MiraLax (over the counter) for constipation as needed.   Diet general  As directed    Discharge instructions  As directed    Comments:     Pick up stool softner and laxative for home. Do not submerge incision under water. May shower. Continue to use ice for pain and swelling from surgery.  Take Xarelto for two more weeks, then discontinue Xarelto.  He has been started back on his aspirin already. Once the patient has completed the Xarelto, they may resume the Plavix 75 mg.  When discharged from the skilled rehab facility, please have the facility set up the patient's Home Health Physical Therapy prior to being released.  Also provide the patient with their medications at time of release from the facility to include their pain medication, the muscle relaxants, and their blood thinner medication.  If the patient is still at the rehab facility at time of follow up appointment, please also assist the patient in arranging follow up appointment in our office and any transportation needs.  **Please note, the patient needs lab work twice a week to check his serum sodium levels.  Continue lab work until the sodium is back within normal range. Also continue the NaCl tablets as ordered until the sodium is back  within normal range and then can discontinue the NaCl tablets.**   Do not put a pillow under the knee. Place it under the heel.  As directed    Do not sit on low chairs, stoools or toilet seats, as it may be difficult to get up from low surfaces  As directed    Driving restrictions  As directed    Comments:     No driving until released by the physician.   Increase activity slowly as tolerated  As directed    Lifting restrictions  As directed    Comments:     No lifting until released by the physician.   Patient may shower  As directed    Comments:     You may shower without a dressing once there is no drainage.  Do not wash over the wound.  If drainage remains, do not shower until drainage stops.   TED hose  As directed    Comments:     Use stockings (TED hose) for 3 weeks on both leg(s).  You may remove them at night for sleeping.   Weight bearing as tolerated  As directed    Questions:     Laterality:     Extremity:         Medication List    STOP taking these medications       bicalutamide 50 MG tablet  Commonly known as:  CASODEX     CALCIUM 600 + D PO     clopidogrel 75 MG tablet  Commonly known as:  PLAVIX     doxycycline 100 MG capsule  Commonly known as:  VIBRAMYCIN     leuprolide 30 MG injection  Commonly known as:  LUPRON     SUPER B COMPLEX PO     XGEVA 120 MG/1.7ML Soln injection  Generic drug:  denosumab  TAKE these medications       acetaminophen 325 MG tablet  Commonly known as:  TYLENOL  Take 2 tablets (650 mg total) by mouth every 6 (six) hours as needed.     acyclovir 200 MG capsule  Commonly known as:  ZOVIRAX  Take 200 mg by mouth daily.     alum & mag hydroxide-simeth 200-200-20 MG/5ML suspension  Commonly known as:  MAALOX/MYLANTA  Take 30 mLs by mouth every 6 (six) hours as needed.     amLODipine 5 MG tablet  Commonly known as:  NORVASC  Take 5 mg by mouth every morning.     aspirin EC 81 MG tablet  Take 40.5 mg by mouth  every morning.     atorvastatin 10 MG tablet  Commonly known as:  LIPITOR  Take 10 mg by mouth every Monday, Wednesday, and Friday.     bisacodyl 10 MG suppository  Commonly known as:  DULCOLAX  Place 1 suppository (10 mg total) rectally daily as needed.     DSS 100 MG Caps  Take 100 mg by mouth 2 (two) times daily.     hydrochlorothiazide 50 MG tablet  Commonly known as:  HYDRODIURIL  Take 50 mg by mouth every morning.     lisinopril 40 MG tablet  Commonly known as:  PRINIVIL,ZESTRIL  Take 40 mg by mouth every morning.     LORazepam 0.5 MG tablet  Commonly known as:  ATIVAN  Take 0.5 mg by mouth at bedtime.     ondansetron 4 MG tablet  Commonly known as:  ZOFRAN  Take 1 tablet (4 mg total) by mouth every 6 (six) hours as needed for nausea.     oxyCODONE 5 MG immediate release tablet  Commonly known as:  Oxy IR/ROXICODONE  Take 1-2 tablets (5-10 mg total) by mouth every 3 (three) hours as needed.     polyethylene glycol packet  Commonly known as:  MIRALAX / GLYCOLAX  Take 17 g by mouth daily as needed.     rivaroxaban 10 MG Tabs tablet  Commonly known as:  XARELTO  - Take 1 tablet (10 mg total) by mouth daily with breakfast. Take Xarelto for two and a more weeks, then discontinue Xarelto.  - **Please note, the patient should resume his Plavix the very next day after finishing the Xarelto medication.**     sodium chloride 1 G tablet  Take 2 tablets (2 g total) by mouth 2 (two) times daily with a meal. Please continue the NaCL tablets until the Serum Sodium level is back within normal range, then discontinue the sodium tablets.  Na+ levels to be drawn twice a week until normal.     traMADol 50 MG tablet  Commonly known as:  ULTRAM  Take 1-2 tablets (50-100 mg total) by mouth every 6 (six) hours as needed (mild pain).           Follow-up Information   Follow up with Loanne Drilling, MD. Schedule an appointment as soon as possible for a visit in 1 week.    Specialty:  Orthopedic Surgery   Contact information:   [address] [place] [address] [phone]       Signed: Patrica Duel 06/29/2013, 8:41 AM

## 2013-07-05 LAB — BASIC METABOLIC PANEL
BUN: 22 mg/dL — AB (ref 4–21)
Glucose: 93 mg/dL

## 2013-07-05 LAB — CBC AND DIFFERENTIAL: Platelets: 395 10*3/uL (ref 150–399)

## 2013-07-08 LAB — BASIC METABOLIC PANEL
BUN: 18 mg/dL (ref 4–21)
Creatinine: 0.9 mg/dL (ref 0.6–1.3)
Potassium: 4.6 mmol/L (ref 3.4–5.3)
Sodium: 136 mmol/L — AB (ref 137–147)

## 2013-07-09 ENCOUNTER — Non-Acute Institutional Stay (SKILLED_NURSING_FACILITY): Payer: Medicare Other | Admitting: Nurse Practitioner

## 2013-07-09 ENCOUNTER — Encounter: Payer: Self-pay | Admitting: Nurse Practitioner

## 2013-07-09 DIAGNOSIS — G4700 Insomnia, unspecified: Secondary | ICD-10-CM

## 2013-07-09 DIAGNOSIS — D62 Acute posthemorrhagic anemia: Secondary | ICD-10-CM

## 2013-07-09 DIAGNOSIS — I10 Essential (primary) hypertension: Secondary | ICD-10-CM

## 2013-07-09 DIAGNOSIS — E871 Hypo-osmolality and hyponatremia: Secondary | ICD-10-CM

## 2013-07-09 DIAGNOSIS — M1710 Unilateral primary osteoarthritis, unspecified knee: Secondary | ICD-10-CM

## 2013-07-09 DIAGNOSIS — M179 Osteoarthritis of knee, unspecified: Secondary | ICD-10-CM

## 2013-07-09 DIAGNOSIS — A6000 Herpesviral infection of urogenital system, unspecified: Secondary | ICD-10-CM

## 2013-07-09 HISTORY — DX: Insomnia, unspecified: G47.00

## 2013-07-09 NOTE — Assessment & Plan Note (Signed)
Resolved. Dc NaCl-repeat BMP in one week.

## 2013-07-09 NOTE — Assessment & Plan Note (Signed)
Hgb 9.0 07/05/13. Post the right TKR. Will add Iron 325mg  bid po-update CBC in 2 weeks.

## 2013-07-09 NOTE — Assessment & Plan Note (Signed)
Took Benadryl 25mg  nightly at home--not adequate in aiding him to sleep at night. Will have Benadryl 25mg  hs prn available to him and start Mirtazapine 7.5mg  po qhs to help sleep and appetite.

## 2013-07-09 NOTE — Progress Notes (Signed)
Patient ID: Craig [name], male   DOB: 05/14/1926, 79 y.o.   MRN: [id number]  Code Status: DNR  Allergies  Allergen Reactions  . Penicillins Other (See Comments)    unknown    Chief Complaint  Patient presents with  . Acute Visit    hyponatremia, insomnia, anemia.     HPI: Patient is a 79 y.o. male seen in the SNF at [facility] today for evaluation of anemia, insomnia, hyponatremia, s/p R TKR,  and other chronic medical conditions. Hospitalized 06/21/13-06/29/13 for  RIGHT TOTAL KNEE ARTHROPLASTY for  end stage OA of his right knee with progressively worsening pain and dysfunction. He had constant pain, with activity and at rest and significant functional deficits with difficulties even with ADLs. He has had extensive non-op management including analgesics, injections of cortisone and viscosupplements, and home exercise program, but remains in significant pain with significant dysfunction. Radiographs show bone on bone arthritis lateral and patellofemoral.   Problem List Items Addressed This Visit   Genital herpes     Chronic Acyclovir 200mg  daily.     HTN (hypertension)     Controlled on Lisinopril and HCTZ. No longer hypotensive since Amlodipine was discontinued.     Hyponatremia     Resolved. Dc NaCl-repeat BMP in one week.     Insomnia - Primary     Took Benadryl 25mg  nightly at home--not adequate in aiding him to sleep at night. Will have Benadryl 25mg  hs prn available to him and start Mirtazapine 7.5mg  po qhs to help sleep and appetite.     OA (osteoarthritis) of knee     S/p R TKR, healing nicely. Here for Rehab and goal [place] to [place] @ [facility]    Postoperative anemia due to acute blood loss     Hgb 9.0 07/05/13. Post the right TKR. Will add Iron 325mg  bid po-update CBC in 2 weeks.        Review of Systems:  Review of Systems  Constitutional: Negative for fever, chills, weight loss, malaise/fatigue and diaphoresis.       Poor appetite.   HENT: Negative for  congestion, ear discharge, ear pain, hearing loss, nosebleeds, sore throat and tinnitus.   Eyes: Negative for blurred vision, double vision, photophobia, pain, discharge and redness.  Respiratory: Negative for cough, hemoptysis, sputum production, shortness of breath, wheezing and stridor.   Cardiovascular: Positive for leg swelling. Negative for chest pain, palpitations, orthopnea, claudication and PND.       RLE  Gastrointestinal: Positive for constipation. Negative for heartburn, nausea, vomiting, abdominal pain, diarrhea and blood in stool.       Uses Laxatives.   Genitourinary: Positive for frequency. Negative for dysuria, urgency, hematuria and flank pain.  Musculoskeletal: Positive for joint pain. Negative for back pain, falls, myalgias and neck pain.  Skin: Negative for itching and rash.       Right knee surgical incision healing nicely.   Neurological: Negative for dizziness, tingling, tremors, sensory change, speech change, focal weakness, seizures, loss of consciousness, weakness and headaches.  Endo/Heme/Allergies: Negative for environmental allergies and polydipsia. Does not bruise/bleed easily.  Psychiatric/Behavioral: Negative for depression, suicidal ideas, hallucinations, memory loss and substance abuse. The patient has insomnia. The patient is not nervous/anxious.      Past Medical History  Diagnosis Date  . Hypertension   . Degenerative disc disease   . Degenerative joint disease   . Genital herpes   . Diverticulitis   . Prostate cancer  Past Surgical History  Procedure Laterality Date  . Total knee arthroplasty Right 06/21/2013    Procedure: RIGHT TOTAL KNEE ARTHROPLASTY;  Surgeon: Loanne Drilling, MD;  Location: [facility]S;  Service: Orthopedics;  Laterality: Right;   Social History:   reports that he quit smoking about 29 years ago. He does not have any smokeless tobacco history on file. He reports that he drinks about 1.2 ounces of alcohol per week. His drug  history is not on file.  Medications: Patient's Medications  New Prescriptions   No medications on file  Previous Medications   ACETAMINOPHEN (TYLENOL) 325 MG TABLET    Take 2 tablets (650 mg total) by mouth every 6 (six) hours as needed.   ACYCLOVIR (ZOVIRAX) 200 MG CAPSULE    Take 200 mg by mouth daily.    ALUM & MAG HYDROXIDE-SIMETH (MAALOX/MYLANTA) 200-200-20 MG/5ML SUSPENSION    Take 30 mLs by mouth every 6 (six) hours as needed.   ASPIRIN EC 81 MG TABLET    Take 40.5 mg by mouth every morning.   ATORVASTATIN (LIPITOR) 10 MG TABLET    Take 10 mg by mouth every Monday, Wednesday, and Friday.    BISACODYL (DULCOLAX) 10 MG SUPPOSITORY    Place 1 suppository (10 mg total) rectally daily as needed.   DOCUSATE SODIUM 100 MG CAPS    Take 100 mg by mouth 2 (two) times daily.   HYDROCHLOROTHIAZIDE (HYDRODIURIL) 50 MG TABLET    Take 50 mg by mouth every morning.    LISINOPRIL (PRINIVIL,ZESTRIL) 40 MG TABLET    Take 40 mg by mouth every morning.    LORAZEPAM (ATIVAN) 0.5 MG TABLET    Take 0.5 mg by mouth at bedtime.    ONDANSETRON (ZOFRAN) 4 MG TABLET    Take 1 tablet (4 mg total) by mouth every 6 (six) hours as needed for nausea.   OXYCODONE (OXY IR/ROXICODONE) 5 MG IMMEDIATE RELEASE TABLET    Take 1-2 tablets (5-10 mg total) by mouth every 3 (three) hours as needed.   POLYETHYLENE GLYCOL (MIRALAX / GLYCOLAX) PACKET    Take 17 g by mouth daily as needed.   RIVAROXABAN (XARELTO) 10 MG TABS TABLET    Take 1 tablet (10 mg total) by mouth daily with breakfast. Take Xarelto for two and a more weeks, then discontinue Xarelto. **Please note, the patient should resume his Plavix the very next day after finishing the Xarelto medication.**   TRAMADOL (ULTRAM) 50 MG TABLET    Take 1-2 tablets (50-100 mg total) by mouth every 6 (six) hours as needed (mild pain).  Modified Medications   No medications on file  Discontinued Medications   AMLODIPINE (NORVASC) 5 MG TABLET    Take 5 mg by mouth every morning.     SODIUM CHLORIDE 1 G TABLET    Take 2 tablets (2 g total) by mouth 2 (two) times daily with a meal. Please continue the NaCL tablets until the Serum Sodium level is back within normal range, then discontinue the sodium tablets.  Na+ levels to be drawn twice a week until normal.     Physical Exam: Physical Exam  Constitutional: He is oriented to person, place, and time. He appears well-developed and well-nourished. No distress.  HENT:  Head: Normocephalic and atraumatic.  Right Ear: External ear normal.  Left Ear: External ear normal.  Nose: Nose normal.  Mouth/Throat: Oropharynx is clear and moist. No oropharyngeal exudate.  Eyes: Conjunctivae and EOM are normal. Pupils are equal, round, and  reactive to light. Right eye exhibits no discharge. Left eye exhibits no discharge. No scleral icterus.  Neck: Normal range of motion. Neck supple. No JVD present. No tracheal deviation present. No thyromegaly present.  Cardiovascular: Normal rate, regular rhythm, normal heart sounds and intact distal pulses.   No murmur heard. Pulmonary/Chest: Effort normal and breath sounds normal. No stridor. No respiratory distress. He has no wheezes. He has no rales.  Abdominal: Soft. Bowel sounds are normal. He exhibits no distension. There is no tenderness. There is no rebound and no guarding.  Genitourinary:  Hx of genital herpes.   Musculoskeletal: He exhibits edema and tenderness.  RLE trace edema. R knee surgical incision healing nicely.   Lymphadenopathy:    He has cervical adenopathy.  Neurological: He is alert and oriented to person, place, and time. He has normal reflexes. No cranial nerve deficit. Coordination normal.  Skin: Skin is warm and dry. No rash noted. He is not diaphoretic. No erythema. No pallor.  Psychiatric: He has a normal mood and affect. His behavior is normal. Judgment and thought content normal. Cognition and memory are normal.  insomnia    Filed Vitals:   07/09/13 1726  BP:  141/86  Pulse: 75  Temp: 98.1 F (36.7 C)  TempSrc: Tympanic  Resp: 18      Labs reviewed: Basic Metabolic Panel:  Recent Labs  16/10/96 0324 06/25/13 0805  06/26/13 0418 06/26/13 1627 06/27/13 0420 06/28/13 0530 06/29/13 0355 07/05/13 07/08/13  NA 112* 113*  < > 124* 123* 125* 126* 130* 135* 136*  K 3.5 3.2*  < > 3.7 3.6 3.4* 3.3* 3.6 4.1 4.6  CL 79* 80*  < > 88* 88* 90* 92* 96  --   --   CO2 26 24  < > 29 29 29 26 27   --   --   GLUCOSE 100* 102*  < > 112* 112* 101* 96 103*  --   --   BUN 8 8  < > 13 14 15 14 17  22* 18  CREATININE 0.64 0.62  < > 0.74 0.70 0.81 0.74 0.68 0.9 0.9  CALCIUM 7.9* 8.0*  < > 8.3* 8.5 8.6 8.4 8.4  --   --   PHOS  --   --   --   --  2.2* 2.0*  --   --   --   --   TSH  --  0.840  --   --   --   --   --   --   --   --   < > = values in this interval not displayed. Liver Function Tests:  Recent Labs  12/15/12 0934  06/15/13 0905 06/24/13 1320 06/26/13 1627 06/27/13 0420  AST 20  --  22 20  --   --   ALT 19  --  16 12  --   --   ALKPHOS 37*  --  39 46  --   --   BILITOT 0.65  --  0.5 0.9  --   --   PROT 6.9  --  6.9 5.3*  --   --   ALBUMIN 3.7  < > 4.3 3.0* 2.9* 3.0*  < > = values in this interval not displayed. No results found for this basename: LIPASE, AMYLASE,  in the last 8760 hours No results found for this basename: AMMONIA,  in the last 8760 hours CBC:  Recent Labs  09/16/12 1025 12/15/12 0934  06/24/13 0458 06/25/13 0805 06/26/13 0454  07/05/13  WBC 8.7 6.7  < > 10.2 10.3 10.4 10.6  NEUTROABS 5.9 4.0  --   --   --   --   --   HGB 14.9 14.3  < > 9.6* 9.8* 9.3* 9.0*  HCT 42.6 42.0  < > 26.2* 25.9* 25.5* 26*  MCV 101.3* 100.8*  < > 89.4 87.5 91.4  --   PLT 253 184  < > 121* 152 213 395  < > = values in this interval not displayed. Lipid Panel:  Recent Labs  06/24/13 0458  CHOL 126  HDL 62  LDLCALC 50  TRIG 72  CHOLHDL 2.0    Past Procedures:  06/27/2013 CLINICAL DATA: Cough. Recently postop for knee surgery.  CXR IMPRESSION: Pulmonary emphysema. No active disease.   06/15/2013 CLINICAL DATA: Preoperative for knee surgery. EXAM: CHEST 2 VIEW IMPRESSION: 1. No acute findings. 2. Thoracolumbar scoliosis and spondylosis. 3. Emphysema. 4. Severe left degenerative glenohumeral arthropathy. 5. Chronic blunting of the left lateral costophrenic angle due to scarring. 6. Old granulomatous disease.   06/24/2013 CLINICAL DATA: Post knee replacement. Episode of altered mental status with difficulty expressing himself. Question stroke EXAM: MRI HEAD WITHOUT CONTRAST: IMPRESSION: No acute infarct.   Assessment/Plan Insomnia Took Benadryl 25mg  nightly at home--not adequate in aiding him to sleep at night. Will have Benadryl 25mg  hs prn available to him and start Mirtazapine 7.5mg  po qhs to help sleep and appetite.   Hyponatremia Resolved. Dc NaCl-repeat BMP in one week.   Postoperative anemia due to acute blood loss Hgb 9.0 07/05/13. Post the right TKR. Will add Iron 325mg  bid po-update CBC in 2 weeks.   Genital herpes Chronic Acyclovir 200mg  daily.   HTN (hypertension) Controlled on Lisinopril and HCTZ. No longer hypotensive since Amlodipine was discontinued.   OA (osteoarthritis) of knee S/p R TKR, healing nicely. Here for Rehab and [place] [facility]    Family/ Staff Communication: observe the patient.   Goals of Care: SNF  Labs/tests ordered:  BMP in one week. CBC in 2weeks.

## 2013-07-10 ENCOUNTER — Encounter: Payer: Self-pay | Admitting: Nurse Practitioner

## 2013-07-10 DIAGNOSIS — I10 Essential (primary) hypertension: Secondary | ICD-10-CM | POA: Insufficient documentation

## 2013-07-10 DIAGNOSIS — A6000 Herpesviral infection of urogenital system, unspecified: Secondary | ICD-10-CM | POA: Insufficient documentation

## 2013-07-10 HISTORY — DX: Herpesviral infection of urogenital system, unspecified: A60.00

## 2013-07-10 NOTE — Assessment & Plan Note (Signed)
S/p R TKR, healing nicely. Here for Rehab and [place] to return [place] @ [facility]

## 2013-07-10 NOTE — Assessment & Plan Note (Signed)
Controlled on Lisinopril and HCTZ. No longer hypotensive since Amlodipine was discontinued.    

## 2013-07-10 NOTE — Assessment & Plan Note (Signed)
Chronic Acyclovir 200mg daily.   

## 2013-07-13 ENCOUNTER — Non-Acute Institutional Stay (SKILLED_NURSING_FACILITY): Payer: Medicare Other | Admitting: Nurse Practitioner

## 2013-07-13 ENCOUNTER — Encounter: Payer: Self-pay | Admitting: Nurse Practitioner

## 2013-07-13 DIAGNOSIS — J069 Acute upper respiratory infection, unspecified: Secondary | ICD-10-CM

## 2013-07-13 DIAGNOSIS — K219 Gastro-esophageal reflux disease without esophagitis: Secondary | ICD-10-CM

## 2013-07-13 DIAGNOSIS — M1710 Unilateral primary osteoarthritis, unspecified knee: Secondary | ICD-10-CM

## 2013-07-13 DIAGNOSIS — D62 Acute posthemorrhagic anemia: Secondary | ICD-10-CM

## 2013-07-13 DIAGNOSIS — I10 Essential (primary) hypertension: Secondary | ICD-10-CM

## 2013-07-13 DIAGNOSIS — G4700 Insomnia, unspecified: Secondary | ICD-10-CM

## 2013-07-13 DIAGNOSIS — E871 Hypo-osmolality and hyponatremia: Secondary | ICD-10-CM

## 2013-07-13 DIAGNOSIS — M179 Osteoarthritis of knee, unspecified: Secondary | ICD-10-CM

## 2013-07-13 NOTE — Assessment & Plan Note (Signed)
S/p R TKR, healing nicely. Here for Rehab and goal is to return [place] @ [facility] 

## 2013-07-13 NOTE — Assessment & Plan Note (Signed)
Took Benadryl 25mg  nightly at home--not adequate in aiding him to sleep at night. No used Benadryl 25mg  hs prn available to him and since Mirtazapine 7.5mg  po qhs to help sleep and appetite 4 days ago.

## 2013-07-13 NOTE — Assessment & Plan Note (Signed)
C/o hacking cough especially in am, sore throat, nasal bleed x2 on the left--Azithromycin 250mg  II day1 and I daily for day2-5. Saline nasal spray tid. Magic mouth wash 5ml s/s ac and hs for 2 weeks.

## 2013-07-13 NOTE — Progress Notes (Signed)
Patient ID: Craig [name], male   DOB: 09-Aug-1926, 79 y.o.   MRN: [id number]  Code Status: DNR  Allergies  Allergen Reactions  . Penicillins Other (See Comments)    unknown    Chief Complaint  Patient presents with  . Medical Managment of Chronic Issues    sore throat, GERD    HPI: Patient is a 79 y.o. male seen in the SNF at [facility] today for evaluation of sore throat, heart burns, anemia, insomnia, hyponatremia, s/p R TKR,  and other chronic medical conditions.   Problem List Items Addressed This Visit   Acute upper respiratory infections of unspecified site     C/o hacking cough especially in am, sore throat, nasal bleed x2 on the left--Azithromycin 250mg  II day1 and I daily for day2-5. Saline nasal spray tid. Magic mouth wash 5ml s/s ac and hs for 2 weeks.     GERD (gastroesophageal reflux disease) - Primary     C/o heart burn, regurgitated acid taste stomach content, am sore throat and cough-will add Omeprazole 20mg  daily.     Relevant Medications      omeprazole (PRILOSEC) 20 MG capsule   HTN (hypertension)     Controlled on Lisinopril and HCTZ. No longer hypotensive since Amlodipine was discontinued.       Hyponatremia     Resolved. Dc'd NaCl-repeat BMP in one week.       Insomnia     Took Benadryl 25mg  nightly at home--not adequate in aiding him to sleep at night. No used Benadryl 25mg  hs prn available to him and since Mirtazapine 7.5mg  po qhs to help sleep and appetite 4 days ago.       OA (osteoarthritis) of knee     S/p R TKR, healing nicely. Here for Rehab and goal [place] to return [place] @ [facility]      Postoperative anemia due to acute blood loss     Hgb 9.0 07/05/13. Post the right TKR. Will add Iron 325mg  bid po-update CBC in 2 weeks.          Review of Systems:  Review of Systems  Constitutional: Negative for fever, chills, weight loss, malaise/fatigue and diaphoresis.       Poor appetite.   HENT: Positive for nosebleeds and sore throat.  Negative for congestion, ear discharge, ear pain, hearing loss and tinnitus.   Eyes: Negative for blurred vision, double vision, photophobia, pain, discharge and redness.  Respiratory: Positive for cough. Negative for hemoptysis, sputum production, shortness of breath, wheezing and stridor.   Cardiovascular: Positive for leg swelling. Negative for chest pain, palpitations, orthopnea, claudication and PND.       RLE  Gastrointestinal: Positive for heartburn and constipation. Negative for nausea, vomiting, abdominal pain, diarrhea and blood in stool.       Uses Laxatives.   Genitourinary: Positive for frequency. Negative for dysuria, urgency, hematuria and flank pain.  Musculoskeletal: Positive for joint pain. Negative for back pain, falls, myalgias and neck pain.  Skin: Negative for itching and rash.       Right knee surgical incision healing nicely.   Neurological: Negative for dizziness, tingling, tremors, sensory change, speech change, focal weakness, seizures, loss of consciousness, weakness and headaches.  Endo/Heme/Allergies: Negative for environmental allergies and polydipsia. Does not bruise/bleed easily.  Psychiatric/Behavioral: Negative for depression, suicidal ideas, hallucinations, memory loss and substance abuse. The patient has insomnia. The patient is not nervous/anxious.      Past Medical History  Diagnosis Date  .  Hypertension   . Degenerative disc disease   . Degenerative joint disease   . Genital herpes   . Diverticulitis   . Prostate cancer    Past Surgical History  Procedure Laterality Date  . Total knee arthroplasty Right 06/21/2013    Procedure: RIGHT TOTAL KNEE ARTHROPLASTY;  Surgeon: Loanne Drilling, MD;  Location: [facility];  Service: Orthopedics;  Laterality: Right;   Social History:   reports that he quit smoking about 29 years ago. He does not have any smokeless tobacco history on file. He reports that he drinks about 1.2 ounces of alcohol per week. His drug  history is not on file.  Medications: Patient's Medications  New Prescriptions   No medications on file  Previous Medications   ACETAMINOPHEN (TYLENOL) 325 MG TABLET    Take 2 tablets (650 mg total) by mouth every 6 (six) hours as needed.   ACYCLOVIR (ZOVIRAX) 200 MG CAPSULE    Take 200 mg by mouth daily.    ALUM & MAG HYDROXIDE-SIMETH (MAALOX/MYLANTA) 200-200-20 MG/5ML SUSPENSION    Take 30 mLs by mouth every 6 (six) hours as needed.   ASPIRIN EC 81 MG TABLET    Take 40.5 mg by mouth every morning.   ATORVASTATIN (LIPITOR) 10 MG TABLET    Take 10 mg by mouth every Monday, Wednesday, and Friday.    BISACODYL (DULCOLAX) 10 MG SUPPOSITORY    Place 1 suppository (10 mg total) rectally daily as needed.   DOCUSATE SODIUM 100 MG CAPS    Take 100 mg by mouth 2 (two) times daily.   HYDROCHLOROTHIAZIDE (HYDRODIURIL) 50 MG TABLET    Take 50 mg by mouth every morning.    LISINOPRIL (PRINIVIL,ZESTRIL) 40 MG TABLET    Take 40 mg by mouth every morning.    LORAZEPAM (ATIVAN) 0.5 MG TABLET    Take 0.5 mg by mouth at bedtime.    OMEPRAZOLE (PRILOSEC) 20 MG CAPSULE    Take 20 mg by mouth daily.   ONDANSETRON (ZOFRAN) 4 MG TABLET    Take 1 tablet (4 mg total) by mouth every 6 (six) hours as needed for nausea.   OXYCODONE (OXY IR/ROXICODONE) 5 MG IMMEDIATE RELEASE TABLET    Take 1-2 tablets (5-10 mg total) by mouth every 3 (three) hours as needed.   POLYETHYLENE GLYCOL (MIRALAX / GLYCOLAX) PACKET    Take 17 g by mouth daily as needed.   RIVAROXABAN (XARELTO) 10 MG TABS TABLET    Take 1 tablet (10 mg total) by mouth daily with breakfast. Take Xarelto for two and a more weeks, then discontinue Xarelto. **Please note, the patient should resume his Plavix the very next day after finishing the Xarelto medication.**   TRAMADOL (ULTRAM) 50 MG TABLET    Take 1-2 tablets (50-100 mg total) by mouth every 6 (six) hours as needed (mild pain).  Modified Medications   No medications on file  Discontinued Medications   No  medications on file     Physical Exam: Physical Exam  Constitutional: He is oriented to person, place, and time. He appears well-developed and well-nourished. No distress.  HENT:  Head: Normocephalic and atraumatic.  Right Ear: External ear normal.  Left Ear: External ear normal.  Nose: Nose normal.  Mouth/Throat: Oropharynx is clear and moist. No oropharyngeal exudate.  Left nostril dry blood. Erythema in his throat w/o drainage.   Eyes: Conjunctivae and EOM are normal. Pupils are equal, round, and reactive to light. Right eye exhibits no discharge. Left eye  exhibits no discharge. No scleral icterus.  Neck: Normal range of motion. Neck supple. No JVD present. No tracheal deviation present. No thyromegaly present.  Cardiovascular: Normal rate, regular rhythm, normal heart sounds and intact distal pulses.   No murmur heard. Pulmonary/Chest: Effort normal and breath sounds normal. No stridor. No respiratory distress. He has no wheezes. He has no rales.  Abdominal: Soft. Bowel sounds are normal. He exhibits no distension. There is no tenderness. There is no rebound and no guarding.  Genitourinary:  Hx of genital herpes.   Musculoskeletal: He exhibits edema and tenderness.  RLE trace edema. R knee surgical incision healing nicely.   Lymphadenopathy:    He has cervical adenopathy.  Neurological: He is alert and oriented to person, place, and time. He has normal reflexes. No cranial nerve deficit. Coordination normal.  Skin: Skin is warm and dry. No rash noted. He is not diaphoretic. No erythema. No pallor.  Psychiatric: He has a normal mood and affect. His behavior is normal. Judgment and thought content normal. Cognition and memory are normal.  insomnia    Filed Vitals:   07/13/13 1656  BP: 114/70  Pulse: 64  Temp: 98.1 F (36.7 C)  TempSrc: Tympanic  Resp: 16  SpO2: 98%      Labs reviewed: Basic Metabolic Panel:  Recent Labs  16/10/96 0324 06/25/13 0805  06/26/13 0418  06/26/13 1627 06/27/13 0420 06/28/13 0530 06/29/13 0355 07/05/13 07/08/13  NA 112* 113*  < > 124* 123* 125* 126* 130* 135* 136*  K 3.5 3.2*  < > 3.7 3.6 3.4* 3.3* 3.6 4.1 4.6  CL 79* 80*  < > 88* 88* 90* 92* 96  --   --   CO2 26 24  < > 29 29 29 26 27   --   --   GLUCOSE 100* 102*  < > 112* 112* 101* 96 103*  --   --   BUN 8 8  < > 13 14 15 14 17  22* 18  CREATININE 0.64 0.62  < > 0.74 0.70 0.81 0.74 0.68 0.9 0.9  CALCIUM 7.9* 8.0*  < > 8.3* 8.5 8.6 8.4 8.4  --   --   PHOS  --   --   --   --  2.2* 2.0*  --   --   --   --   TSH  --  0.840  --   --   --   --   --   --   --   --   < > = values in this interval not displayed. Liver Function Tests:  Recent Labs  12/15/12 0934  06/15/13 0905 06/24/13 1320 06/26/13 1627 06/27/13 0420  AST 20  --  22 20  --   --   ALT 19  --  16 12  --   --   ALKPHOS 37*  --  39 46  --   --   BILITOT 0.65  --  0.5 0.9  --   --   PROT 6.9  --  6.9 5.3*  --   --   ALBUMIN 3.7  < > 4.3 3.0* 2.9* 3.0*  < > = values in this interval not displayed. No results found for this basename: LIPASE, AMYLASE,  in the last 8760 hours No results found for this basename: AMMONIA,  in the last 8760 hours CBC:  Recent Labs  09/16/12 1025 12/15/12 0934  06/24/13 0458 06/25/13 0805 06/26/13 0418 07/05/13  WBC 8.7 6.7  < >  10.2 10.3 10.4 10.6  NEUTROABS 5.9 4.0  --   --   --   --   --   HGB 14.9 14.3  < > 9.6* 9.8* 9.3* 9.0*  HCT 42.6 42.0  < > 26.2* 25.9* 25.5* 26*  MCV 101.3* 100.8*  < > 89.4 87.5 91.4  --   PLT 253 184  < > 121* 152 213 395  < > = values in this interval not displayed. Lipid Panel:  Recent Labs  06/24/13 0458  CHOL 126  HDL 62  LDLCALC 50  TRIG 72  CHOLHDL 2.0    Past Procedures:  06/27/2013 CLINICAL DATA: Cough. Recently postop for knee surgery. CXR IMPRESSION: Pulmonary emphysema. No active disease.   06/15/2013 CLINICAL DATA: Preoperative for knee surgery. EXAM: CHEST 2 VIEW IMPRESSION: 1. No acute findings. 2. Thoracolumbar  scoliosis and spondylosis. 3. Emphysema. 4. Severe left degenerative glenohumeral arthropathy. 5. Chronic blunting of the left lateral costophrenic angle due to scarring. 6. Old granulomatous disease.   06/24/2013 CLINICAL DATA: Post knee replacement. Episode of altered mental status with difficulty expressing himself. Question stroke EXAM: MRI HEAD WITHOUT CONTRAST: IMPRESSION: No acute infarct.   Assessment/Plan GERD (gastroesophageal reflux disease) C/o heart burn, regurgitated acid taste stomach content, am sore throat and cough-will add Omeprazole 20mg  daily.   Acute upper respiratory infections of unspecified site C/o hacking cough especially in am, sore throat, nasal bleed x2 on the left--Azithromycin 250mg  II day1 and I daily for day2-5. Saline nasal spray tid. Magic mouth wash 5ml s/s ac and hs for 2 weeks.   Postoperative anemia due to acute blood loss Hgb 9.0 07/05/13. Post the right TKR. Will add Iron 325mg  bid po-update CBC in 2 weeks.     OA (osteoarthritis) of knee S/p R TKR, healing nicely. Here for Rehab and goal [place] @ [facility]    Insomnia Took Benadryl 25mg  nightly at home--not adequate in aiding him to sleep at night. No used Benadryl 25mg  hs prn available to him and since Mirtazapine 7.5mg  po qhs to help sleep and appetite 4 days ago.     Hyponatremia Resolved. Dc'd NaCl-repeat BMP in one week.     HTN (hypertension) Controlled on Lisinopril and HCTZ. No longer hypotensive since Amlodipine was discontinued.       Family/ Staff Communication: observe the patient.   Goals of Care: SNF  Labs/tests ordered:  BMP in one week. CBC in 2weeks.

## 2013-07-13 NOTE — Assessment & Plan Note (Signed)
Controlled on Lisinopril and HCTZ. No longer hypotensive since Amlodipine was discontinued.

## 2013-07-13 NOTE — Assessment & Plan Note (Signed)
Hgb 9.0 07/05/13. Post the right TKR. Will add Iron 325mg bid po-update CBC in 2 weeks.  

## 2013-07-13 NOTE — Assessment & Plan Note (Signed)
Resolved. Dc'd NaCl-repeat BMP in one week.

## 2013-07-13 NOTE — Assessment & Plan Note (Signed)
C/o heart burn, regurgitated acid taste stomach content, am sore throat and cough-will add Omeprazole 20mg  daily.

## 2013-07-20 ENCOUNTER — Ambulatory Visit: Payer: Medicare Other | Admitting: Oncology

## 2013-07-20 ENCOUNTER — Other Ambulatory Visit: Payer: Medicare Other | Admitting: Lab

## 2013-07-27 ENCOUNTER — Ambulatory Visit: Payer: Medicare Other | Admitting: Oncology

## 2013-07-27 ENCOUNTER — Encounter: Payer: Self-pay | Admitting: Nurse Practitioner

## 2013-07-31 NOTE — Progress Notes (Signed)
This encounter was created in error - please disregard.

## 2013-08-03 ENCOUNTER — Non-Acute Institutional Stay (SKILLED_NURSING_FACILITY): Payer: Medicare Other | Admitting: Nurse Practitioner

## 2013-08-03 DIAGNOSIS — D62 Acute posthemorrhagic anemia: Secondary | ICD-10-CM

## 2013-08-03 DIAGNOSIS — E871 Hypo-osmolality and hyponatremia: Secondary | ICD-10-CM

## 2013-08-03 DIAGNOSIS — M179 Osteoarthritis of knee, unspecified: Secondary | ICD-10-CM

## 2013-08-03 DIAGNOSIS — K219 Gastro-esophageal reflux disease without esophagitis: Secondary | ICD-10-CM

## 2013-08-03 DIAGNOSIS — G4700 Insomnia, unspecified: Secondary | ICD-10-CM

## 2013-08-03 DIAGNOSIS — I10 Essential (primary) hypertension: Secondary | ICD-10-CM

## 2013-08-03 DIAGNOSIS — M1710 Unilateral primary osteoarthritis, unspecified knee: Secondary | ICD-10-CM

## 2013-08-03 DIAGNOSIS — A6000 Herpesviral infection of urogenital system, unspecified: Secondary | ICD-10-CM

## 2013-08-06 ENCOUNTER — Encounter: Payer: Self-pay | Admitting: Nurse Practitioner

## 2013-08-06 NOTE — Assessment & Plan Note (Signed)
Chronic Acyclovir 200mg daily.   

## 2013-08-06 NOTE — Assessment & Plan Note (Signed)
Controlled on Lisinopril and HCTZ.

## 2013-08-06 NOTE — Progress Notes (Signed)
Patient ID: Craig [name], male   DOB: 1926-03-01, 79 y.o.   MRN: [id number]     Code Status: DNR  Allergies  Allergen Reactions  . Penicillins Other (See Comments)    unknown    Chief Complaint  Patient presents with  . Medical Managment of Chronic Issues    HPI: Patient is a 79 y.o. male seen in the SNF at [facility] today for evaluation of chronic medical conditions.   Problem List Items Addressed This Visit   OA (osteoarthritis) of knee     S/p R TKR, healing nicely-Orth 07/29/13-the right knee looks great, swelling decreased, f/u 3 months.. Here for Rehab and goal is to return [place] @ [facility]. Pain is adequately controlled. Dc Home.         Relevant Medications      methocarbamol (ROBAXIN) 500 MG tablet   Postoperative anemia due to acute blood loss     Hgb 9.0 07/05/13. Post the right TKR. Takes Iron 325mg  bid po-update CBC pending.         Relevant Medications      ferrous sulfate 325 (65 FE) MG tablet   Hyponatremia     Resolved. Dc'd NaCl        Insomnia     Took Benadryl 25mg  nightly at home--not adequate in aiding him to sleep at night. No used Benadryl 25mg  hs prn available to him and since Mirtazapine 7.5mg  po qhs to help sleep and appetite         Genital herpes     Chronic Acyclovir 200mg  daily.       HTN (hypertension)     Controlled on Lisinopril and HCTZ.         GERD (gastroesophageal reflux disease) - Primary     C/o heart burn, regurgitated acid taste stomach content, am sore throat and cough-better with Omeprazole 20mg  daily and Magic Mouth Wash         Review of Systems:  Review of Systems  Constitutional: Negative for fever, chills, weight loss, malaise/fatigue and diaphoresis.       Poor appetite.   HENT: Positive for nosebleeds and sore throat. Negative for congestion, ear discharge, ear pain, hearing loss and tinnitus.   Eyes: Negative for blurred vision, double vision, photophobia, pain, discharge and  redness.  Respiratory: Positive for cough. Negative for hemoptysis, sputum production, shortness of breath, wheezing and stridor.   Cardiovascular: Positive for leg swelling. Negative for chest pain, palpitations, orthopnea, claudication and PND.       RLE  Gastrointestinal: Positive for heartburn and constipation. Negative for nausea, vomiting, abdominal pain, diarrhea and blood in stool.       Uses Laxatives.   Genitourinary: Positive for frequency. Negative for dysuria, urgency, hematuria and flank pain.  Musculoskeletal: Positive for joint pain. Negative for back pain, falls, myalgias and neck pain.  Skin: Negative for itching and rash.       Right knee surgical incision healing nicely.   Neurological: Negative for dizziness, tingling, tremors, sensory change, speech change, focal weakness, seizures, loss of consciousness, weakness and headaches.  Endo/Heme/Allergies: Negative for environmental allergies and polydipsia. Does not bruise/bleed easily.  Psychiatric/Behavioral: Negative for depression, suicidal ideas, hallucinations, memory loss and substance abuse. The patient has insomnia. The patient is not nervous/anxious.      Past Medical History  Diagnosis Date  . Hypertension   . Degenerative disc disease   . Degenerative joint disease   . Genital herpes   .  Diverticulitis   . Prostate cancer    Past Surgical History  Procedure Laterality Date  . Total knee arthroplasty Right 06/21/2013    Procedure: RIGHT TOTAL KNEE ARTHROPLASTY;  Surgeon: Loanne Drilling, MD;  Location: [facility];  Service: Orthopedics;  Laterality: Right;   Social History:   reports that he quit smoking about 29 years ago. He does not have any smokeless tobacco history on file. He reports that he drinks about 1.2 ounces of alcohol per week. His drug history is not on file.  Medications: Patient's Medications  New Prescriptions   No medications on file  Previous Medications   ACETAMINOPHEN (TYLENOL) 325  MG TABLET    Take 2 tablets (650 mg total) by mouth every 6 (six) hours as needed.   ACYCLOVIR (ZOVIRAX) 200 MG CAPSULE    Take 200 mg by mouth daily.    ALUM & MAG HYDROXIDE-SIMETH (MAALOX/MYLANTA) 200-200-20 MG/5ML SUSPENSION    Take 30 mLs by mouth every 6 (six) hours as needed.   ASPIRIN EC 81 MG TABLET    Take 40.5 mg by mouth every morning.   ATORVASTATIN (LIPITOR) 10 MG TABLET    Take 10 mg by mouth every Monday, Wednesday, and Friday.    BISACODYL (DULCOLAX) 10 MG SUPPOSITORY    Place 1 suppository (10 mg total) rectally daily as needed.   CLOPIDOGREL (PLAVIX) 75 MG TABLET    Take 75 mg by mouth daily with breakfast.   DOCUSATE SODIUM 100 MG CAPS    Take 100 mg by mouth 2 (two) times daily.   FERROUS SULFATE 325 (65 FE) MG TABLET    Take 325 mg by mouth 2 (two) times daily with a meal.   HYDROCHLOROTHIAZIDE (HYDRODIURIL) 50 MG TABLET    Take 50 mg by mouth every morning.    LISINOPRIL (PRINIVIL,ZESTRIL) 40 MG TABLET    Take 40 mg by mouth every morning.    LORAZEPAM (ATIVAN) 0.5 MG TABLET    Take 0.5 mg by mouth at bedtime.    METHOCARBAMOL (ROBAXIN) 500 MG TABLET    Take 500 mg by mouth every 8 (eight) hours as needed for muscle spasms.   MIRTAZAPINE (REMERON) 7.5 MG TABLET    Take 7.5 mg by mouth at bedtime.   OMEPRAZOLE (PRILOSEC) 20 MG CAPSULE    Take 20 mg by mouth daily.   ONDANSETRON (ZOFRAN) 4 MG TABLET    Take 1 tablet (4 mg total) by mouth every 6 (six) hours as needed for nausea.   OXYCODONE (OXY IR/ROXICODONE) 5 MG IMMEDIATE RELEASE TABLET    Take 1-2 tablets (5-10 mg total) by mouth every 3 (three) hours as needed.   POLYETHYLENE GLYCOL (MIRALAX / GLYCOLAX) PACKET    Take 17 g by mouth daily as needed.  Modified Medications   No medications on file  Discontinued Medications   RIVAROXABAN (XARELTO) 10 MG TABS TABLET    Take 1 tablet (10 mg total) by mouth daily with breakfast. Take Xarelto for two and a more weeks, then discontinue Xarelto. **Please note, the patient should  resume his Plavix the very next day after finishing the Xarelto medication.**   TRAMADOL (ULTRAM) 50 MG TABLET    Take 1-2 tablets (50-100 mg total) by mouth every 6 (six) hours as needed (mild pain).     Physical Exam: Physical Exam  Constitutional: He is oriented to person, place, and time. He appears well-developed and well-nourished. No distress.  HENT:  Head: Normocephalic and atraumatic.  Right Ear: External  ear normal.  Left Ear: External ear normal.  Nose: Nose normal.  Mouth/Throat: Oropharynx is clear and moist. No oropharyngeal exudate.  Left nostril dry blood. Erythema in his throat w/o drainage.   Eyes: Conjunctivae and EOM are normal. Pupils are equal, round, and reactive to light. Right eye exhibits no discharge. Left eye exhibits no discharge. No scleral icterus.  Neck: Normal range of motion. Neck supple. No JVD present. No tracheal deviation present. No thyromegaly present.  Cardiovascular: Normal rate, regular rhythm, normal heart sounds and intact distal pulses.   No murmur heard. Pulmonary/Chest: Effort normal and breath sounds normal. No stridor. No respiratory distress. He has no wheezes. He has no rales.  Abdominal: Soft. Bowel sounds are normal. He exhibits no distension. There is no tenderness. There is no rebound and no guarding.  Genitourinary:  Hx of genital herpes.   Musculoskeletal: He exhibits edema and tenderness.  RLE trace edema. R knee surgical incision healing nicely.   Lymphadenopathy:    He has cervical adenopathy.  Neurological: He is alert and oriented to person, place, and time. He has normal reflexes. No cranial nerve deficit. Coordination normal.  Skin: Skin is warm and dry. No rash noted. He is not diaphoretic. No erythema. No pallor.  Psychiatric: He has a normal mood and affect. His behavior is normal. Judgment and thought content normal. Cognition and memory are normal.  insomnia    Filed Vitals:   08/06/13 2112  BP: 104/58  Pulse:  76  Temp: 98.4 F (36.9 C)  TempSrc: Tympanic  Resp: 20      Labs reviewed: Basic Metabolic Panel:  Recent Labs  65/78/46 0324 06/25/13 0805  06/26/13 0418 06/26/13 1627 06/27/13 0420 06/28/13 0530 06/29/13 0355 07/05/13 07/08/13  NA 112* 113*  < > 124* 123* 125* 126* 130* 135* 136*  K 3.5 3.2*  < > 3.7 3.6 3.4* 3.3* 3.6 4.1 4.6  CL 79* 80*  < > 88* 88* 90* 92* 96  --   --   CO2 26 24  < > 29 29 29 26 27   --   --   GLUCOSE 100* 102*  < > 112* 112* 101* 96 103*  --   --   BUN 8 8  < > 13 14 15 14 17  22* 18  CREATININE 0.64 0.62  < > 0.74 0.70 0.81 0.74 0.68 0.9 0.9  CALCIUM 7.9* 8.0*  < > 8.3* 8.5 8.6 8.4 8.4  --   --   PHOS  --   --   --   --  2.2* 2.0*  --   --   --   --   TSH  --  0.840  --   --   --   --   --   --   --   --   < > = values in this interval not displayed. Liver Function Tests:  Recent Labs  12/15/12 0934  06/15/13 0905 06/24/13 1320 06/26/13 1627 06/27/13 0420  AST 20  --  22 20  --   --   ALT 19  --  16 12  --   --   ALKPHOS 37*  --  39 46  --   --   BILITOT 0.65  --  0.5 0.9  --   --   PROT 6.9  --  6.9 5.3*  --   --   ALBUMIN 3.7  < > 4.3 3.0* 2.9* 3.0*  < > = values in this  interval not displayed.  CBC:  Recent Labs  09/16/12 1025 12/15/12 0934  06/24/13 0458 06/25/13 0805 06/26/13 0418 07/05/13  WBC 8.7 6.7  < > 10.2 10.3 10.4 10.6  NEUTROABS 5.9 4.0  --   --   --   --   --   HGB 14.9 14.3  < > 9.6* 9.8* 9.3* 9.0*  HCT 42.6 42.0  < > 26.2* 25.9* 25.5* 26*  MCV 101.3* 100.8*  < > 89.4 87.5 91.4  --   PLT 253 184  < > 121* 152 213 395  < > = values in this interval not displayed. Lipid Panel:  Recent Labs  06/24/13 0458  CHOL 126  HDL 62  LDLCALC 50  TRIG 72  CHOLHDL 2.0    Past Procedures:  06/27/2013 CLINICAL DATA: Cough. Recently postop for knee surgery. CXR IMPRESSION: Pulmonary emphysema. No active disease.   06/15/2013 CLINICAL DATA: Preoperative for knee surgery. EXAM: CHEST 2 VIEW IMPRESSION: 1. No acute  findings. 2. Thoracolumbar scoliosis and spondylosis. 3. Emphysema. 4. Severe left degenerative glenohumeral arthropathy. 5. Chronic blunting of the left lateral costophrenic angle due to scarring. 6. Old granulomatous disease.   06/24/2013 CLINICAL DATA: Post knee replacement. Episode of altered mental status with difficulty expressing himself. Question stroke EXAM: MRI HEAD WITHOUT CONTRAST: IMPRESSION: No acute infarct.   Assessment/Plan GERD (gastroesophageal reflux disease) C/o heart burn, regurgitated acid taste stomach content, am sore throat and cough-better with Omeprazole 20mg  daily and Magic Mouth Wash    HTN (hypertension) Controlled on Lisinopril and HCTZ.       Genital herpes Chronic Acyclovir 200mg  daily.     Insomnia Took Benadryl 25mg  nightly at home--not adequate in aiding him to sleep at night. No used Benadryl 25mg  hs prn available to him and since Mirtazapine 7.5mg  po qhs to help sleep and appetite       Hyponatremia Resolved. Dc'd NaCl      Postoperative anemia due to acute blood loss Hgb 9.0 07/05/13. Post the right TKR. Takes Iron 325mg  bid po-update CBC pending.       OA (osteoarthritis) of knee S/p R TKR, healing nicely-Orth 07/29/13-the right knee looks great, swelling decreased, f/u 3 months.. Here for Rehab and [place] [facility]. Pain is adequately controlled. Dc Home.         Family/ Staff Communication: observe the patient.   Goals of Care: SNF  Labs/tests ordered: none

## 2013-08-06 NOTE — Assessment & Plan Note (Signed)
C/o heart burn, regurgitated acid taste stomach content, am sore throat and cough-better with Omeprazole 20mg  daily and Magic Mouth Wash

## 2013-08-06 NOTE — Assessment & Plan Note (Addendum)
S/p R TKR, healing nicely-Orth 07/29/13-the right knee looks great, swelling decreased, f/u 3 months.. Here for Rehab and goal [place] to [place] @ [facility]. Pain is adequately controlled. Dc Home.

## 2013-08-06 NOTE — Assessment & Plan Note (Signed)
Took Benadryl 25mg  nightly at home--not adequate in aiding him to sleep at night. No used Benadryl 25mg  hs prn available to him and since Mirtazapine 7.5mg  po qhs to help sleep and appetite

## 2013-08-06 NOTE — Assessment & Plan Note (Signed)
Hgb 9.0 07/05/13. Post the right TKR. Takes Iron 325mg  bid po-update CBC pending.

## 2013-08-06 NOTE — Assessment & Plan Note (Signed)
Resolved. Dc'd NaCl

## 2013-08-13 ENCOUNTER — Other Ambulatory Visit: Payer: Self-pay | Admitting: Medical Oncology

## 2013-08-13 ENCOUNTER — Telehealth: Payer: Self-pay | Admitting: Medical Oncology

## 2013-08-13 NOTE — Telephone Encounter (Signed)
Patient called stating he has been in rehab for the past month and had not been taking his bicalutamide prescription for a month now, requesting to know should he continue with this medication. Reviewed with MD, and informed patient to continue taking this medication as directed. Patient gave verbal understanding, no further questions at this time. Knows to call office for any questions or concerns.

## 2013-08-25 ENCOUNTER — Other Ambulatory Visit (HOSPITAL_COMMUNITY): Payer: Self-pay | Admitting: Orthopedic Surgery

## 2013-08-25 ENCOUNTER — Other Ambulatory Visit (HOSPITAL_COMMUNITY): Payer: Self-pay | Admitting: Specialist

## 2013-08-25 ENCOUNTER — Ambulatory Visit (HOSPITAL_COMMUNITY)
Admission: RE | Admit: 2013-08-25 | Discharge: 2013-08-25 | Disposition: A | Discharge status: Home / Self Care | Payer: Medicare Other | Source: Ambulatory Visit | Attending: Specialist | Admitting: Specialist

## 2013-08-25 DIAGNOSIS — M7989 Other specified soft tissue disorders: Secondary | ICD-10-CM

## 2013-08-25 DIAGNOSIS — M25561 Pain in right knee: Secondary | ICD-10-CM

## 2013-08-25 DIAGNOSIS — M79609 Pain in unspecified limb: Secondary | ICD-10-CM

## 2013-08-25 NOTE — Progress Notes (Signed)
*  Preliminary Results* Right lower extremity venous duplex completed. Right lower extremity is negative for deep vein thrombosis. There is no evidence of right Baker's cyst.  Preliminary results discussed with Faith of Dr.Aluisio's office.  08/25/2013 3:07 PM  Gertie Fey, RVT, RDCS, RDMS

## 2013-09-18 ENCOUNTER — Ambulatory Visit (INDEPENDENT_AMBULATORY_CARE_PROVIDER_SITE_OTHER): Payer: Medicare Other | Admitting: Family Medicine

## 2013-09-18 VITALS — BP 110/70 | HR 61 | Temp 97.6°F | Resp 16 | Ht 70.0 in | Wt 164.0 lb

## 2013-09-18 DIAGNOSIS — S81801A Unspecified open wound, right lower leg, initial encounter: Secondary | ICD-10-CM

## 2013-09-18 DIAGNOSIS — S81809A Unspecified open wound, unspecified lower leg, initial encounter: Secondary | ICD-10-CM

## 2013-09-18 DIAGNOSIS — S91009A Unspecified open wound, unspecified ankle, initial encounter: Secondary | ICD-10-CM

## 2013-09-18 DIAGNOSIS — M25569 Pain in unspecified knee: Secondary | ICD-10-CM

## 2013-09-18 DIAGNOSIS — S81009A Unspecified open wound, unspecified knee, initial encounter: Secondary | ICD-10-CM

## 2013-09-18 NOTE — Progress Notes (Signed)
Subjective

## 2013-09-18 NOTE — Patient Instructions (Signed)
Keep wound covered for about 5 days with a Band-Aid. Try not to wash it, but it is okay if the skin gets wet. Return if problems or concerns

## 2013-09-27 ENCOUNTER — Telehealth: Payer: Self-pay | Admitting: Neurology

## 2013-09-27 NOTE — Telephone Encounter (Signed)
I called pt and LMVM that I was returning his call.

## 2013-09-27 NOTE — Telephone Encounter (Signed)
Patient would like a call back from the nurse regarding making an appointment for a new problem. Please call to advise.

## 2013-09-28 NOTE — Telephone Encounter (Signed)
Pt has an appt per Derrek Gu, CMA.  (09-29-13).

## 2013-09-29 ENCOUNTER — Ambulatory Visit (INDEPENDENT_AMBULATORY_CARE_PROVIDER_SITE_OTHER): Payer: Medicare Other | Admitting: Neurology

## 2013-09-29 ENCOUNTER — Encounter: Payer: Self-pay | Admitting: Neurology

## 2013-09-29 VITALS — BP 152/82 | HR 73 | Wt 165.0 lb

## 2013-09-29 DIAGNOSIS — M47817 Spondylosis without myelopathy or radiculopathy, lumbosacral region: Secondary | ICD-10-CM

## 2013-09-29 DIAGNOSIS — M1610 Unilateral primary osteoarthritis, unspecified hip: Secondary | ICD-10-CM

## 2013-09-29 HISTORY — DX: Spondylosis without myelopathy or radiculopathy, lumbosacral region: M47.817

## 2013-09-29 HISTORY — DX: Unilateral primary osteoarthritis, unspecified hip: M16.10

## 2013-09-29 NOTE — Progress Notes (Signed)
Reason for visit: Leg pain  Craig [name] is an 80 y.o. male  History of present illness:  Craig [name] is an 80 year old right-handed white male with a history of prostate cancer metastatic to bone. The patient also has lumbosacral spondylosis and some low back pain. The patient is now wearing a brace for the back. The patient recently underwent a total knee replacement on the right, and he currently is getting therapy for this. The patient has had improved pain in the right knee with ambulation. The patient however, continues to have some discomfort in the hips going down into the thighs with weightbearing only. The patient only has to be up on his feet for several moments before the pain begins. When he sits down, the pain goes away rapidly. The patient has undergone several lumbosacral epidural steroid injections without significant benefit. X-rays of the hips bilaterally were done approximately one year ago showing extensive arthritis in both hips, on the right greater than left. The patient returns for an evaluation. If the patient walks with a walker, he is able to take pressure off of his hips, and he has no pain.  Past Medical History  Diagnosis Date  . Hypertension   . Degenerative disc disease   . Degenerative joint disease   . Genital herpes   . Diverticulitis   . Prostate cancer   . Lumbosacral spondylolysis   . Hip arthritis 09/29/2013    Bilateral    Past Surgical History  Procedure Laterality Date  . Total knee arthroplasty Right 06/21/2013    Procedure: RIGHT TOTAL KNEE ARTHROPLASTY;  Surgeon: Gearlean Alf, MD;  Location: [facility];  Service: Orthopedics;  Laterality: Right;  . Mastoidectomy Bilateral   . Cataract extraction Bilateral   . Tonsillectomy      Family History  Problem Relation Age of Onset  . Heart disease Mother     Social history:  reports that he quit smoking about 29 years ago. He has never used smokeless tobacco. He reports that he drinks  about 1.2 ounces of alcohol per week. His drug history is not on file.    Allergies  Allergen Reactions  . Penicillins Other (See Comments)    unknown    Medications:  Current Outpatient Prescriptions on File Prior to Visit  Medication Sig Dispense Refill  . acyclovir (ZOVIRAX) 200 MG capsule Take 200 mg by mouth daily.       Marland Kitchen aspirin EC 81 MG tablet Take 40.5 mg by mouth every morning.      Marland Kitchen atorvastatin (LIPITOR) 10 MG tablet Take 10 mg by mouth every Monday, Wednesday, and Friday.       . clopidogrel (PLAVIX) 75 MG tablet Take 75 mg by mouth daily with breakfast.      . hydrochlorothiazide (HYDRODIURIL) 50 MG tablet Take 50 mg by mouth every morning.       Marland Kitchen lisinopril (PRINIVIL,ZESTRIL) 40 MG tablet Take 40 mg by mouth every morning.       Marland Kitchen LORazepam (ATIVAN) 0.5 MG tablet Take 0.5 mg by mouth at bedtime.        No current facility-administered medications on file prior to visit.    ROS:  Out of a complete 14 system review of symptoms, the patient complains only of the following symptoms, and all other reviewed systems are negative.  Frequency of urination Back pain Bruising easily  Blood pressure 152/82, pulse 73, weight 165 lb (74.844 kg).  Physical Exam  General: The patient  is alert and cooperative at the time of the examination.  Skin: No significant peripheral edema is noted.   Neurologic Exam  Mental status: The patient is oriented x 3.  Cranial nerves: Facial symmetry is present. Speech is normal, no aphasia or dysarthria is noted. Extraocular movements are full. Visual fields are full.  Motor: The patient has good strength in all 4 extremities.  Sensory examination: Soft touch sensation is symmetric on the face, arms, and legs.  Coordination: The patient has good finger-nose-finger and heel-to-shin bilaterally.  Gait and station: The patient has a normal gait. Tandem gait is slightly unsteady. Romberg is negative. No drift is seen.  Reflexes: Deep  tendon reflexes are symmetric, but are depressed.   Assessment/Plan:  1. Low back pain, lumbosacral spondylosis  2. Leg pain  3. Bilateral hip arthritis  The patient may be having discomfort in the hips and legs associated with hip arthritis. The patient may benefit from injections of the hip rather than the lumbosacral spine. The patient is being followed through a pain center, and he has an orthopedic surgeon. The patient will followup through this office if needed.  Jill Alexanders MD 09/29/2013 8:15 PM  [facility] [address] [place], [place] [address]  Phone [phone] Fax [phone]

## 2013-10-07 ENCOUNTER — Other Ambulatory Visit: Payer: Self-pay | Admitting: Urology

## 2013-10-07 DIAGNOSIS — C61 Malignant neoplasm of prostate: Secondary | ICD-10-CM

## 2013-10-19 ENCOUNTER — Encounter (HOSPITAL_COMMUNITY): Payer: Medicare Other

## 2013-10-19 ENCOUNTER — Encounter (HOSPITAL_COMMUNITY)
Admission: RE | Admit: 2013-10-19 | Discharge: 2013-10-19 | Disposition: A | Discharge status: Home / Self Care | Payer: Medicare Other | Source: Ambulatory Visit | Attending: Urology | Admitting: Urology

## 2013-10-19 DIAGNOSIS — M4120 Other idiopathic scoliosis, site unspecified: Secondary | ICD-10-CM | POA: Insufficient documentation

## 2013-10-19 DIAGNOSIS — C61 Malignant neoplasm of prostate: Secondary | ICD-10-CM | POA: Insufficient documentation

## 2013-10-19 MED ORDER — TECHNETIUM TC 99M MEDRONATE IV KIT
25.0000 | PACK | Freq: Once | INTRAVENOUS | Status: AC | PRN
  Administered 2013-10-19: 25 via INTRAVENOUS

## 2013-11-20 ENCOUNTER — Encounter (HOSPITAL_COMMUNITY): Payer: Self-pay | Admitting: Emergency Medicine

## 2013-11-20 ENCOUNTER — Inpatient Hospital Stay (HOSPITAL_COMMUNITY)
Admission: EM | Admit: 2013-11-20 | Discharge: 2013-11-24 | DRG: 280 | Disposition: A | Discharge status: Home / Self Care | Payer: Medicare Other | Attending: Internal Medicine | Admitting: Internal Medicine

## 2013-11-20 DIAGNOSIS — J189 Pneumonia, unspecified organism: Secondary | ICD-10-CM | POA: Diagnosis present

## 2013-11-20 DIAGNOSIS — I214 Non-ST elevation (NSTEMI) myocardial infarction: Secondary | ICD-10-CM

## 2013-11-20 DIAGNOSIS — G459 Transient cerebral ischemic attack, unspecified: Secondary | ICD-10-CM

## 2013-11-20 DIAGNOSIS — I10 Essential (primary) hypertension: Secondary | ICD-10-CM | POA: Diagnosis present

## 2013-11-20 DIAGNOSIS — K219 Gastro-esophageal reflux disease without esophagitis: Secondary | ICD-10-CM

## 2013-11-20 DIAGNOSIS — J4489 Other specified chronic obstructive pulmonary disease: Secondary | ICD-10-CM | POA: Diagnosis present

## 2013-11-20 DIAGNOSIS — D62 Acute posthemorrhagic anemia: Secondary | ICD-10-CM

## 2013-11-20 DIAGNOSIS — E785 Hyperlipidemia, unspecified: Secondary | ICD-10-CM | POA: Diagnosis present

## 2013-11-20 DIAGNOSIS — C61 Malignant neoplasm of prostate: Secondary | ICD-10-CM | POA: Diagnosis present

## 2013-11-20 DIAGNOSIS — Z79899 Other long term (current) drug therapy: Secondary | ICD-10-CM

## 2013-11-20 DIAGNOSIS — I2510 Atherosclerotic heart disease of native coronary artery without angina pectoris: Secondary | ICD-10-CM

## 2013-11-20 DIAGNOSIS — M1610 Unilateral primary osteoarthritis, unspecified hip: Secondary | ICD-10-CM

## 2013-11-20 DIAGNOSIS — Z87891 Personal history of nicotine dependence: Secondary | ICD-10-CM

## 2013-11-20 DIAGNOSIS — R490 Dysphonia: Secondary | ICD-10-CM

## 2013-11-20 DIAGNOSIS — J449 Chronic obstructive pulmonary disease, unspecified: Secondary | ICD-10-CM | POA: Diagnosis present

## 2013-11-20 DIAGNOSIS — Z7902 Long term (current) use of antithrombotics/antiplatelets: Secondary | ICD-10-CM

## 2013-11-20 DIAGNOSIS — M1710 Unilateral primary osteoarthritis, unspecified knee: Secondary | ICD-10-CM

## 2013-11-20 DIAGNOSIS — M179 Osteoarthritis of knee, unspecified: Secondary | ICD-10-CM

## 2013-11-20 DIAGNOSIS — Z96659 Presence of unspecified artificial knee joint: Secondary | ICD-10-CM

## 2013-11-20 DIAGNOSIS — E871 Hypo-osmolality and hyponatremia: Secondary | ICD-10-CM

## 2013-11-20 DIAGNOSIS — M47817 Spondylosis without myelopathy or radiculopathy, lumbosacral region: Secondary | ICD-10-CM

## 2013-11-20 DIAGNOSIS — J069 Acute upper respiratory infection, unspecified: Secondary | ICD-10-CM

## 2013-11-20 DIAGNOSIS — R5381 Other malaise: Secondary | ICD-10-CM | POA: Diagnosis present

## 2013-11-20 DIAGNOSIS — A6000 Herpesviral infection of urogenital system, unspecified: Secondary | ICD-10-CM

## 2013-11-20 DIAGNOSIS — Z7982 Long term (current) use of aspirin: Secondary | ICD-10-CM

## 2013-11-20 DIAGNOSIS — G4700 Insomnia, unspecified: Secondary | ICD-10-CM

## 2013-11-20 HISTORY — DX: Non-ST elevation (NSTEMI) myocardial infarction: I21.4

## 2013-11-20 HISTORY — DX: Atherosclerotic heart disease of native coronary artery without angina pectoris: I25.10

## 2013-11-20 NOTE — ED Notes (Signed)
Pt arrived to the ED with a complaint of Shortness of breath.  Pt has a hx of bronchitis and tonight became short of breath and unable to recapture it.

## 2013-11-21 ENCOUNTER — Emergency Department (HOSPITAL_COMMUNITY): Payer: Medicare Other

## 2013-11-21 DIAGNOSIS — I214 Non-ST elevation (NSTEMI) myocardial infarction: Secondary | ICD-10-CM

## 2013-11-21 DIAGNOSIS — J069 Acute upper respiratory infection, unspecified: Secondary | ICD-10-CM

## 2013-11-21 DIAGNOSIS — E785 Hyperlipidemia, unspecified: Secondary | ICD-10-CM | POA: Diagnosis present

## 2013-11-21 DIAGNOSIS — J189 Pneumonia, unspecified organism: Secondary | ICD-10-CM | POA: Diagnosis present

## 2013-11-21 LAB — CBC WITH DIFFERENTIAL/PLATELET
BASOS ABS: 0 10*3/uL (ref 0.0–0.1)
BASOS PCT: 0 % (ref 0–1)
Eosinophils Absolute: 0.1 10*3/uL (ref 0.0–0.7)
Eosinophils Relative: 1 % (ref 0–5)
HCT: 39.5 % (ref 39.0–52.0)
Hemoglobin: 14.6 g/dL (ref 13.0–17.0)
LYMPHS PCT: 10 % — AB (ref 12–46)
Lymphs Abs: 1.1 10*3/uL (ref 0.7–4.0)
MCH: 33.6 pg (ref 26.0–34.0)
MCHC: 37 g/dL — AB (ref 30.0–36.0)
MCV: 91 fL (ref 78.0–100.0)
Monocytes Absolute: 1.2 10*3/uL — ABNORMAL HIGH (ref 0.1–1.0)
Monocytes Relative: 11 % (ref 3–12)
NEUTROS ABS: 8.3 10*3/uL — AB (ref 1.7–7.7)
NEUTROS PCT: 77 % (ref 43–77)
PLATELETS: 207 10*3/uL (ref 150–400)
RBC: 4.34 MIL/uL (ref 4.22–5.81)
RDW: 13.4 % (ref 11.5–15.5)
WBC: 10.8 10*3/uL — ABNORMAL HIGH (ref 4.0–10.5)

## 2013-11-21 LAB — STREP PNEUMONIAE URINARY ANTIGEN: Strep Pneumo Urinary Antigen: NEGATIVE

## 2013-11-21 LAB — URINALYSIS, ROUTINE W REFLEX MICROSCOPIC
BILIRUBIN URINE: NEGATIVE
Glucose, UA: NEGATIVE mg/dL
Hgb urine dipstick: NEGATIVE
Ketones, ur: NEGATIVE mg/dL
LEUKOCYTES UA: NEGATIVE
NITRITE: NEGATIVE
PH: 7.5 (ref 5.0–8.0)
Protein, ur: 30 mg/dL — AB
SPECIFIC GRAVITY, URINE: 1.009 (ref 1.005–1.030)
UROBILINOGEN UA: 0.2 mg/dL (ref 0.0–1.0)

## 2013-11-21 LAB — PROTIME-INR
INR: 1.14 (ref 0.00–1.49)
PROTHROMBIN TIME: 14.4 s (ref 11.6–15.2)

## 2013-11-21 LAB — URINE MICROSCOPIC-ADD ON

## 2013-11-21 LAB — BASIC METABOLIC PANEL
BUN: 16 mg/dL (ref 6–23)
CHLORIDE: 88 meq/L — AB (ref 96–112)
CO2: 25 meq/L (ref 19–32)
Calcium: 9.6 mg/dL (ref 8.4–10.5)
Creatinine, Ser: 0.75 mg/dL (ref 0.50–1.35)
GFR calc non Af Amer: 81 mL/min — ABNORMAL LOW (ref >90)
Glucose, Bld: 102 mg/dL — ABNORMAL HIGH (ref 70–99)
POTASSIUM: 4.6 meq/L (ref 3.7–5.3)
SODIUM: 128 meq/L — AB (ref 137–147)

## 2013-11-21 LAB — TROPONIN I
TROPONIN I: 1.26 ng/mL — AB (ref <0.30)
Troponin I: 0.48 ng/mL (ref <0.30)
Troponin I: 1.38 ng/mL (ref <0.30)

## 2013-11-21 LAB — HEPARIN LEVEL (UNFRACTIONATED)

## 2013-11-21 LAB — PRO B NATRIURETIC PEPTIDE: Pro B Natriuretic peptide (BNP): 656.8 pg/mL — ABNORMAL HIGH (ref 0–450)

## 2013-11-21 LAB — D-DIMER, QUANTITATIVE (NOT AT ARMC): D DIMER QUANT: 0.95 ug{FEU}/mL — AB (ref 0.00–0.48)

## 2013-11-21 MED ORDER — MORPHINE SULFATE 2 MG/ML IJ SOLN: 0.5000 mg | INTRAMUSCULAR | Status: DC | PRN

## 2013-11-21 MED ORDER — LEVOFLOXACIN IN D5W 500 MG/100ML IV SOLN
500.0000 mg | Freq: Once | INTRAVENOUS | Status: AC
  Administered 2013-11-21: 500 mg via INTRAVENOUS
  Filled 2013-11-21: qty 100

## 2013-11-21 MED ORDER — LEVOFLOXACIN IN D5W 750 MG/150ML IV SOLN
750.0000 mg | INTRAVENOUS | Status: DC
  Administered 2013-11-22 – 2013-11-23 (×2): 750 mg via INTRAVENOUS
  Filled 2013-11-21 (×3): qty 150

## 2013-11-21 MED ORDER — SODIUM CHLORIDE 0.9 % IJ SOLN
3.0000 mL | Freq: Two times a day (BID) | INTRAMUSCULAR | Status: DC
  Administered 2013-11-23: 3 mL via INTRAVENOUS

## 2013-11-21 MED ORDER — SODIUM CHLORIDE 0.9 % IJ SOLN: 3.0000 mL | INTRAMUSCULAR | Status: DC | PRN

## 2013-11-21 MED ORDER — HEPARIN (PORCINE) IN NACL 100-0.45 UNIT/ML-% IJ SOLN
1100.0000 [IU]/h | INTRAMUSCULAR | Status: DC
  Administered 2013-11-21: 1100 [IU]/h via INTRAVENOUS
  Administered 2013-11-21: 900 [IU]/h via INTRAVENOUS
  Administered 2013-11-22: 1100 [IU]/h via INTRAVENOUS
  Filled 2013-11-21 (×3): qty 250

## 2013-11-21 MED ORDER — SODIUM CHLORIDE 0.9 % IJ SOLN: 3.0000 mL | Freq: Two times a day (BID) | INTRAMUSCULAR | Status: DC

## 2013-11-21 MED ORDER — SODIUM CHLORIDE 0.9 % IV BOLUS (SEPSIS)
1000.0000 mL | Freq: Once | INTRAVENOUS | Status: AC
  Administered 2013-11-21: 1000 mL via INTRAVENOUS

## 2013-11-21 MED ORDER — SODIUM CHLORIDE 0.9 % IV SOLN: 250.0000 mL | INTRAVENOUS | Status: DC | PRN

## 2013-11-21 MED ORDER — ATORVASTATIN CALCIUM 20 MG PO TABS
20.0000 mg | ORAL_TABLET | Freq: Every day | ORAL | Status: DC
  Administered 2013-11-21 – 2013-11-23 (×2): 20 mg via ORAL
  Filled 2013-11-21 (×4): qty 1

## 2013-11-21 MED ORDER — IOHEXOL 350 MG/ML SOLN
100.0000 mL | Freq: Once | INTRAVENOUS | Status: AC | PRN
  Administered 2013-11-21: 100 mL via INTRAVENOUS

## 2013-11-21 MED ORDER — SENNOSIDES-DOCUSATE SODIUM 8.6-50 MG PO TABS
1.0000 | ORAL_TABLET | Freq: Every evening | ORAL | Status: DC | PRN
  Filled 2013-11-21: qty 1

## 2013-11-21 MED ORDER — ASPIRIN 81 MG PO CHEW
324.0000 mg | CHEWABLE_TABLET | Freq: Once | ORAL | Status: AC
Start: 2013-11-21 — End: 2013-11-21
  Administered 2013-11-21: 324 mg via ORAL
  Filled 2013-11-21: qty 4

## 2013-11-21 MED ORDER — HEPARIN BOLUS VIA INFUSION
2000.0000 [IU] | Freq: Once | INTRAVENOUS | Status: AC
  Administered 2013-11-21: 2000 [IU] via INTRAVENOUS
  Filled 2013-11-21: qty 2000

## 2013-11-21 MED ORDER — ONDANSETRON HCL 4 MG/2ML IJ SOLN: 4.0000 mg | Freq: Four times a day (QID) | INTRAMUSCULAR | Status: DC | PRN

## 2013-11-21 MED ORDER — ONDANSETRON HCL 4 MG PO TABS: 4.0000 mg | ORAL_TABLET | Freq: Four times a day (QID) | ORAL | Status: DC | PRN

## 2013-11-21 MED ORDER — ACETAMINOPHEN 325 MG PO TABS: 650.0000 mg | ORAL_TABLET | Freq: Four times a day (QID) | ORAL | Status: DC | PRN

## 2013-11-21 MED ORDER — ACETAMINOPHEN 650 MG RE SUPP: 650.0000 mg | Freq: Four times a day (QID) | RECTAL | Status: DC | PRN

## 2013-11-21 MED ORDER — HEPARIN BOLUS VIA INFUSION
4000.0000 [IU] | Freq: Once | INTRAVENOUS | Status: AC
  Administered 2013-11-21: 4000 [IU] via INTRAVENOUS
  Filled 2013-11-21: qty 4000

## 2013-11-21 MED ORDER — ASPIRIN EC 81 MG PO TBEC
81.0000 mg | DELAYED_RELEASE_TABLET | Freq: Every day | ORAL | Status: DC
  Administered 2013-11-21 – 2013-11-24 (×4): 81 mg via ORAL
  Filled 2013-11-21 (×4): qty 1

## 2013-11-21 NOTE — H&P (Signed)
[facility]          History and Physical    PCP:   Myriam Jacobson, MD   Chief Complaint:   dyspnea on exertion, shortness of breath, cough  HPI: Patient is a very pleasant 80 year old man with past medical history significant for hypertension, hyperlipidemia, COPD who presents to the hospital today with the above-mentioned complaints. He states that everything began yesterday at about 9 PM. He lives at  [facility] and went to visit a friend down the hallway. On his way back to his room he became very short of breath. He subsequently developed a cough, nonproductive. He couldn't lie down flat. He became concerned enough to come to the hospital to seek evaluation. He  denies any chest pain. In the hospital he was found to have what appears to be a pneumonia on CT chest as well as a non-ST elevated MI with troponins that have risen from 0.48-1.38. EKG does not show any signs for acute ischemia. EDP has contacted cardiology, Dr. Marlou Porch, who has recommended transfer to [facility] in case a cardiac catheterization is required. We have been asked to admit him for further evaluation and management.  Allergies:   Allergies  Allergen Reactions  . Penicillins Other (See Comments)    unknown      Past Medical History  Diagnosis Date  . Hypertension   . Degenerative disc disease   . Degenerative joint disease   . Genital herpes   . Diverticulitis   . Prostate cancer   . Lumbosacral spondylolysis   . Hip arthritis 09/29/2013    Bilateral    Past Surgical History  Procedure Laterality Date  . Total knee arthroplasty Right 06/21/2013    Procedure: RIGHT TOTAL KNEE ARTHROPLASTY;  Surgeon: Gearlean Alf, MD;  Location: [facility];  Service: Orthopedics;  Laterality: Right;  . Mastoidectomy Bilateral   . Cataract extraction Bilateral   . Tonsillectomy      Prior to Admission medications   Medication Sig Start Date End Date Taking? Authorizing Provider    acyclovir (ZOVIRAX) 200 MG capsule Take 200 mg by mouth daily.  08/27/11  Yes Historical Provider, MD  amLODipine (NORVASC) 5 MG tablet Take 5 mg by mouth daily. 09/07/13  Yes Historical Provider, MD  aspirin EC 81 MG tablet Take 40.5 mg by mouth daily.    Yes Historical Provider, MD  atorvastatin (LIPITOR) 10 MG tablet Take 10 mg by mouth every Monday, Wednesday, and Friday.  04/23/12  Yes Historical Provider, MD  bicalutamide (CASODEX) 50 MG tablet Take 50 mg by mouth daily. 09/16/13  Yes Historical Provider, MD  clopidogrel (PLAVIX) 75 MG tablet Take 75 mg by mouth daily with breakfast.   Yes Historical Provider, MD  hydrochlorothiazide (HYDRODIURIL) 50 MG tablet Take 50 mg by mouth every morning.  08/27/11  Yes Historical Provider, MD  lisinopril (PRINIVIL,ZESTRIL) 40 MG tablet Take 40 mg by mouth every morning.  08/27/11  Yes Historical Provider, MD  LORazepam (ATIVAN) 0.5 MG tablet Take 0.5 mg by mouth at bedtime as needed for sleep.  04/29/13  Yes Historical Provider, MD  naproxen sodium (ANAPROX) 220 MG tablet Take 220 mg by mouth once.   Yes Historical Provider, MD    Social History:  reports that he quit smoking about 29 years ago. He has never used smokeless tobacco. He reports that he drinks about 1.2 ounces of alcohol per  week. His drug history is not on file.  Family History  Problem Relation Age of Onset  . Heart disease Mother     Review of Systems:  Constitutional: Denies fever, chills, diaphoresis, appetite change and fatigue.  HEENT: Denies photophobia, eye pain, redness, hearing loss, ear pain, congestion, sore throat, rhinorrhea, sneezing, mouth sores, trouble swallowing, neck pain, neck stiffness and tinnitus.   Respiratory: Denies  chest tightness. Cardiovascular: Denies chest pain, palpitations and leg swelling.  Gastrointestinal: Denies nausea, vomiting, abdominal pain, diarrhea, constipation, blood in stool and abdominal distention.  Genitourinary: Denies dysuria, urgency,  frequency, hematuria, flank pain and difficulty urinating.  Endocrine: Denies: hot or cold intolerance, sweats, changes in hair or nails, polyuria, polydipsia. Musculoskeletal: Denies myalgias, back pain, joint swelling, arthralgias and gait problem.  Skin: Denies pallor, rash and wound.  Neurological: Denies dizziness, seizures, syncope, weakness, light-headedness, numbness and headaches.  Hematological: Denies adenopathy. Easy bruising, personal or family bleeding history  Psychiatric/Behavioral: Denies suicidal ideation, mood changes, confusion, nervousness, sleep disturbance and agitation   Physical Exam: Blood pressure 127/71, pulse 64, temperature 97.9 F (36.6 C), temperature source Oral, resp. rate 19, SpO2 100.00%. General: Alert, awake, oriented x3, in no current distress. HEENT: Normocephalic, atraumatic, pupils equal round and reactive to light, extraocular movements intact, moist mucous membranes. Neck: Supple, no JVD, no lymphadenopathy, no bruits, no goiter. Cardiovascular: Regular rate and rhythm, no murmurs, rubs or gallops. Lungs: Decreased bilateral breath sounds, however no wheezing rhonchi or crackles. Abdomen: Soft, nontender, nondistended, positive bowel sounds, no masses or organomegaly noted. Extremities: No clubbing, cyanosis or edema, positive pedal pulses. Neurologic: Grossly intact and nonfocal.  Labs on Admission:  Results for orders placed during the hospital encounter of 11/20/13 (from the past 48 hour(s))  CBC WITH DIFFERENTIAL     Status: Abnormal   Collection Time    11/21/13  1:37 AM      Result Value Ref Range   WBC 10.8 (*) 4.0 - 10.5 K/uL   RBC 4.34  4.22 - 5.81 MIL/uL   Hemoglobin 14.6  13.0 - 17.0 g/dL   HCT 39.5  39.0 - 52.0 %   MCV 91.0  78.0 - 100.0 fL   MCH 33.6  26.0 - 34.0 pg   MCHC 37.0 (*) 30.0 - 36.0 g/dL   RDW 13.4  11.5 - 15.5 %   Platelets 207  150 - 400 K/uL   Neutrophils Relative % 77  43 - 77 %   Neutro Abs 8.3 (*) 1.7 - 7.7  K/uL   Lymphocytes Relative 10 (*) 12 - 46 %   Lymphs Abs 1.1  0.7 - 4.0 K/uL   Monocytes Relative 11  3 - 12 %   Monocytes Absolute 1.2 (*) 0.1 - 1.0 K/uL   Eosinophils Relative 1  0 - 5 %   Eosinophils Absolute 0.1  0.0 - 0.7 K/uL   Basophils Relative 0  0 - 1 %   Basophils Absolute 0.0  0.0 - 0.1 K/uL  BASIC METABOLIC PANEL     Status: Abnormal   Collection Time    11/21/13  1:37 AM      Result Value Ref Range   Sodium 128 (*) 137 - 147 mEq/L   Potassium 4.6  3.7 - 5.3 mEq/L   Comment: MODERATE HEMOLYSIS     HEMOLYSIS AT THIS LEVEL MAY AFFECT RESULT   Chloride 88 (*) 96 - 112 mEq/L   CO2 25  19 - 32 mEq/L   Glucose, Bld 102 (*)  70 - 99 mg/dL   BUN 16  6 - 23 mg/dL   Creatinine, Ser 0.75  0.50 - 1.35 mg/dL   Calcium 9.6  8.4 - 10.5 mg/dL   GFR calc non Af Amer 81 (*) >90 mL/min   GFR calc Af Amer >90  >90 mL/min   Comment: (NOTE)     The eGFR has been calculated using the CKD EPI equation.     This calculation has not been validated in all clinical situations.     eGFR's persistently <90 mL/min signify possible Chronic Kidney     Disease.  TROPONIN I     Status: Abnormal   Collection Time    11/21/13  1:37 AM      Result Value Ref Range   Troponin I 0.48 (*) <0.30 ng/mL   Comment:            Due to the release kinetics of cTnI,     a negative result within the first hours     of the onset of symptoms does not rule out     myocardial infarction with certainty.     If myocardial infarction is still suspected,     repeat the test at appropriate intervals.     CRITICAL RESULT CALLED TO, READ BACK BY AND VERIFIED WITH:     ASHELL RN AT 0223 ON [id number] BY DLONG  PRO B NATRIURETIC PEPTIDE     Status: Abnormal   Collection Time    11/21/13  1:37 AM      Result Value Ref Range   Pro B Natriuretic peptide (BNP) 656.8 (*) 0 - 450 pg/mL  URINALYSIS, ROUTINE W REFLEX MICROSCOPIC     Status: Abnormal   Collection Time    11/21/13  1:46 AM      Result Value Ref Range   Color,  Urine YELLOW  YELLOW   APPearance CLEAR  CLEAR   Specific Gravity, Urine 1.009  1.005 - 1.030   pH 7.5  5.0 - 8.0   Glucose, UA NEGATIVE  NEGATIVE mg/dL   Hgb urine dipstick NEGATIVE  NEGATIVE   Bilirubin Urine NEGATIVE  NEGATIVE   Ketones, ur NEGATIVE  NEGATIVE mg/dL   Protein, ur 30 (*) NEGATIVE mg/dL   Urobilinogen, UA 0.2  0.0 - 1.0 mg/dL   Nitrite NEGATIVE  NEGATIVE   Leukocytes, UA NEGATIVE  NEGATIVE  URINE MICROSCOPIC-ADD ON     Status: None   Collection Time    11/21/13  1:46 AM      Result Value Ref Range   Squamous Epithelial / LPF RARE  RARE   RBC / HPF 0-2  <3 RBC/hpf  D-DIMER, QUANTITATIVE     Status: Abnormal   Collection Time    11/21/13  2:39 AM      Result Value Ref Range   D-Dimer, Quant 0.95 (*) 0.00 - 0.48 ug/mL-FEU   Comment:            AT THE INHOUSE ESTABLISHED CUTOFF     VALUE OF 0.48 ug/mL FEU,     THIS ASSAY HAS BEEN DOCUMENTED     IN THE LITERATURE TO HAVE     A SENSITIVITY AND NEGATIVE     PREDICTIVE VALUE OF AT LEAST     98 TO 99%.  THE TEST RESULT     SHOULD BE CORRELATED WITH     AN ASSESSMENT OF THE CLINICAL     PROBABILITY OF DVT / VTE.  TROPONIN I  Status: Abnormal   Collection Time    11/21/13  5:11 AM      Result Value Ref Range   Troponin I 1.38 (*) <0.30 ng/mL   Comment:            Due to the release kinetics of cTnI,     a negative result within the first hours     of the onset of symptoms does not rule out     myocardial infarction with certainty.     If myocardial infarction is still suspected,     repeat the test at appropriate intervals.     CRITICAL VALUE NOTED.  VALUE IS CONSISTENT WITH PREVIOUSLY REPORTED AND CALLED VALUE.  PROTIME-INR     Status: None   Collection Time    11/21/13  8:15 AM      Result Value Ref Range   Prothrombin Time 14.4  11.6 - 15.2 seconds   INR 1.14  0.00 - 1.49    Radiological Exams on Admission: Dg Chest 2 View  11/21/2013   CLINICAL DATA:  Chest pain, shortness of breath and cough.   EXAM: CHEST  2 VIEW  COMPARISON:  DG CHEST 2 VIEW dated 06/27/2013  FINDINGS: Cardiac silhouette is unremarkable and unchanged. Mildly calcified aortic knob come in mediastinal silhouette is nonsuspicious. Similar mild increase interstitial markings with increased lung volumes, flattening of the hemidiaphragm. Subcentimeter left lung base granuloma with left lung base pleural thickening and scarring. Trachea projects midline and there is no pneumothorax.  Severe degenerative change the left shoulder. Multiple EKG lines overlie the patient and may obscure subtle underlying pathology.  IMPRESSION: COPD with chronic changes of left lung base, no superimposed acute cardiopulmonary process.   Electronically Signed   By: Elon Alas   On: 11/21/2013 02:06   Ct Angio Chest Pe W/cm &/or Wo Cm  11/21/2013   CLINICAL DATA:  Bronchitis and shortness of breath. Rule out pulmonary embolism.  EXAM: CT ANGIOGRAPHY CHEST WITH CONTRAST  TECHNIQUE: Multidetector CT imaging of the chest was performed using the standard protocol during bolus administration of intravenous contrast. Multiplanar CT image reconstructions and MIPs were obtained to evaluate the vascular anatomy.  CONTRAST:  115m OMNIPAQUE IOHEXOL 350 MG/ML SOLN  COMPARISON:  10/04/2010  FINDINGS: THORACIC INLET/BODY WALL:  No acute abnormality.  MEDIASTINUM:  Normal heart size. No pericardial effusion. No acute vascular abnormality, including pulmonary embolism. Diffuse atherosclerosis, including the coronary arteries. Calcified lymph nodes in the left hilum. No adenopathy.  LUNG WINDOWS:  Background COPD with both bronchial wall thickening and centrilobular emphysema. There is fluid levels within the bilateral mainstem bronchi. Interlobular septal thickening in the lower lungs were there is also ground-glass opacity. There are similar, smaller opacities in the right middle lobe and apical segment left lower lobe. Calcified granuloma at the left base.  UPPER  ABDOMEN:  Cystic changes in the upper left kidney.  OSSEOUS:  There are numerous sclerotic lesions within the imaged spine and ribs, some of which have developed since comparison CT imaging in 2012. The largest are in the T9 (which is remote) and T10 (which is new) vertebral body. There is also abnormal sclerosis within the right T8 rib head and the left posterior ninth rib.  Review of the MIP images confirms the above findings.  IMPRESSION: 1. Negative for pulmonary embolism. 2. Bibasilar lung opacities is likely a combination of pneumonia and atelectasis. 3. COPD with bronchitis and emphysema. 4. Spine and rib sclerotic lesions, consistent with metastatic spread  of the patient's prostate cancer. Given the recently negative bone scan, these may be treated/sterile deposits. Correlate with PSA trend.   Electronically Signed   By: Jorje Guild M.D.   On: 11/21/2013 06:57    Assessment/Plan Principal Problem:   NSTEMI (non-ST elevated myocardial infarction) Active Problems:   HTN (hypertension)   CAP (community acquired pneumonia)   Hyperlipidemia   Non-ST elevated MI -Continue aspirin, heparin drip. -Check 2-D echo to assess LV function and wall motion. -As discussed with Dr. Marlou Porch, we'll transfer to [facility] for further Cardiologic evaluation and possibility of cardiac catheterization.  Community-acquired pneumonia -Continue Levaquin given penicillin allergy. -Check blood/sputum cultures. -Check strep pneumo/Legionella urine antigens.  Hyperlipidemia -Check fasting lipid profile.  Hypertension -Currently well-controlled.  DVT prophylaxis -Currently on heparin drip.  CODE STATUS -Full code.   Time Spent on Admission: 85 minutes  [facility] Pager: [phone] 11/21/2013, 9:41 AM

## 2013-11-21 NOTE — Progress Notes (Signed)
ANTICOAGULATION CONSULT NOTE - Follow Up Consult  Pharmacy Consult for Heparin Indication: chest pain/ACS  Allergies  Allergen Reactions  . Penicillins Other (See Comments)    unknown    Patient Measurements: Height: 5\' 10"  (177.8 cm) Weight: 159 lb 9.8 oz (72.4 kg) IBW/kg (Calculated) : 73 Heparin Dosing Weight: 72 kg  Vital Signs: Temp: 97.8 F (36.6 C) (03/29 1146) Temp src: Oral (03/29 1146) BP: 115/57 mmHg (03/29 1146) Pulse Rate: 69 (03/29 1146)  Labs:  Recent Labs  11/21/13 0137 11/21/13 0511 11/21/13 0815 11/21/13 1845  HGB 14.6  --   --   --   HCT 39.5  --   --   --   PLT 207  --   --   --   LABPROT  --   --  14.4  --   INR  --   --  1.14  --   HEPARINUNFRC  --   --   --  <0.10*  CREATININE 0.75  --   --   --   TROPONINI 0.48* 1.38*  --   --     Estimated Creatinine Clearance: 69.1 ml/min (by C-G formula based on Cr of 0.75).   Medications:  Heparin @ 900 units/hr  Assessment: 80 y.o. M who presented to the [facility] wit SOB and elevated troponins and was transferred to [facility] for management of NSTEMI and work-up for ACS and possible cardiac cath. D-dimer was also noted to be positive however CT scan was negative for PE.  Heparin was initiated with a bolus and drip rate of 900 units/hr around 0830 at [facility]. The heparin level this evening is SUBtherapeutic (HL <0.1, goal of 0.3-0.7). CBC wnl. No overt s/sx of bleeding noted at this time.  Goal of Therapy:  Heparin level 0.3-0.7 units/ml Monitor platelets by anticoagulation protocol: Yes   Plan:  1. Heparin bolus of 2000 units x 1 2. Increase heparin drip rate to 1100 units/hr (11 ml/hr) 3. Will continue to monitor for any signs/symptoms of bleeding and will follow up with heparin level in 8 hours   Alycia Rossetti, PharmD, BCPS Clinical Pharmacist Pager: [phone] 11/21/2013 8:13 PM

## 2013-11-21 NOTE — Consult Note (Addendum)
CARDIOLOGY CONSULT NOTE   Patient ID: Craig [name] MRN: [id number] DOB/AGE: 04-26-1926 80 y.o.  Admit Date: 11/20/2013  Primary Physician: Myriam Jacobson, MD  Primary Cardiologist   Rodney Langton  Clinical Summary Craig [name] is a 80 y.o.male. He is very pleasant and active gentleman. He does have history of bronchitis and has had outpatient treatment with antibiotics on many occasions in the past. He says he has not had recent increased symptoms. However yesterday he noticed the acute onset of shortness of breath with a cough. The cough has continued. He feels that it is "loosening". As he is being admitted his troponins were checked. There is a mild elevation. There was a d-dimer elevation. CT scan revealed no pulmonary embolus. It is felt that he does have probable pneumonia. There is no higher cardiac history.   Allergies  Allergen Reactions  . Penicillins Other (See Comments)    unknown    Medications Scheduled Medications: . aspirin EC  81 mg Oral Daily  . [START ON 11/22/2013] levofloxacin (LEVAQUIN) IV  750 mg Intravenous Q24H  . sodium chloride  3 mL Intravenous Q12H  . sodium chloride  3 mL Intravenous Q12H     Infusions: . heparin 900 Units/hr (11/21/13 0839)     PRN Medications:  sodium chloride, acetaminophen, acetaminophen, morphine injection, ondansetron (ZOFRAN) IV, ondansetron, senna-docusate, sodium chloride   Past Medical History  Diagnosis Date  . Hypertension   . Degenerative disc disease   . Degenerative joint disease   . Genital herpes   . Diverticulitis   . Prostate cancer   . Lumbosacral spondylolysis   . Hip arthritis 09/29/2013    Bilateral    Past Surgical History  Procedure Laterality Date  . Total knee arthroplasty Right 06/21/2013    Procedure: RIGHT TOTAL KNEE ARTHROPLASTY;  Surgeon: Gearlean Alf, MD;  Location: [facility];  Service: Orthopedics;  Laterality: Right;  . Mastoidectomy Bilateral   . Cataract extraction  Bilateral   . Tonsillectomy      Family History  Problem Relation Age of Onset  . Heart disease Mother     Social History [name] reports that he quit smoking about 29 years ago. He has never used smokeless tobacco. [name] reports that he drinks about 1.2 ounces of alcohol per week.  Review of Systems  The patient denies fever, chills, headache, sweats, rash, change in vision, change in hearing, chest pain, nausea vomiting, urinary symptoms. All other systems are reviewed and are negative.  Physical Examination Blood pressure 115/57, pulse 69, temperature 97.8 F (36.6 C), temperature source Oral, resp. rate 18, height 5\' 10"  (1.778 m), weight 159 lb 9.8 oz (72.4 kg), SpO2 97.00%. No intake or output data in the 24 hours ending 11/21/13 1332  The patient is oriented to person time and place. He is very comfortable in bed. His son and daughter are in the room. There is no jugulovenous distention. Lung exam reveal some scattered rhonchi and decreased breath sounds. There is no respiratory distress. Cardiac exam reveals S1 and S2. There no clicks or significant murmurs. The abdomen is soft. There is no peripheral edema. There no musculoskeletal deformities. There are no skin rashes.  Prior Cardiac Testing/Procedures  Lab Results  Basic Metabolic Panel:  Recent Labs Lab 11/21/13 0137  NA 128*  K 4.6  CL 88*  CO2 25  GLUCOSE 102*  BUN 16  CREATININE 0.75  CALCIUM 9.6    Liver Function Tests:  No results found for this basename: AST, ALT, ALKPHOS, BILITOT, PROT, ALBUMIN,  in the last 168 hours  CBC:  Recent Labs Lab 11/21/13 0137  WBC 10.8*  NEUTROABS 8.3*  HGB 14.6  HCT 39.5  MCV 91.0  PLT 207    Cardiac Enzymes:  Recent Labs Lab 11/21/13 0137 11/21/13 0511  TROPONINI 0.48* 1.38*    BNP: No components found with this basename: POCBNP,    Radiology: Dg Chest 2 View  11/21/2013   CLINICAL DATA:  Chest pain, shortness of breath and cough.  EXAM:  CHEST  2 VIEW  COMPARISON:  DG CHEST 2 VIEW dated 06/27/2013  FINDINGS: Cardiac silhouette is unremarkable and unchanged. Mildly calcified aortic knob come in mediastinal silhouette is nonsuspicious. Similar mild increase interstitial markings with increased lung volumes, flattening of the hemidiaphragm. Subcentimeter left lung base granuloma with left lung base pleural thickening and scarring. Trachea projects midline and there is no pneumothorax.  Severe degenerative change the left shoulder. Multiple EKG lines overlie the patient and may obscure subtle underlying pathology.  IMPRESSION: COPD with chronic changes of left lung base, no superimposed acute cardiopulmonary process.   Electronically Signed   By: Elon Alas   On: 11/21/2013 02:06   Ct Angio Chest Pe W/cm &/or Wo Cm  11/21/2013   CLINICAL DATA:  Bronchitis and shortness of breath. Rule out pulmonary embolism.  EXAM: CT ANGIOGRAPHY CHEST WITH CONTRAST  TECHNIQUE: Multidetector CT imaging of the chest was performed using the standard protocol during bolus administration of intravenous contrast. Multiplanar CT image reconstructions and MIPs were obtained to evaluate the vascular anatomy.  CONTRAST:  185mL OMNIPAQUE IOHEXOL 350 MG/ML SOLN  COMPARISON:  10/04/2010  FINDINGS: THORACIC INLET/BODY WALL:  No acute abnormality.  MEDIASTINUM:  Normal heart size. No pericardial effusion. No acute vascular abnormality, including pulmonary embolism. Diffuse atherosclerosis, including the coronary arteries. Calcified lymph nodes in the left hilum. No adenopathy.  LUNG WINDOWS:  Background COPD with both bronchial wall thickening and centrilobular emphysema. There is fluid levels within the bilateral mainstem bronchi. Interlobular septal thickening in the lower lungs were there is also ground-glass opacity. There are similar, smaller opacities in the right middle lobe and apical segment left lower lobe. Calcified granuloma at the left base.  UPPER ABDOMEN:   Cystic changes in the upper left kidney.  OSSEOUS:  There are numerous sclerotic lesions within the imaged spine and ribs, some of which have developed since comparison CT imaging in 2012. The largest are in the T9 (which is remote) and T10 (which is new) vertebral body. There is also abnormal sclerosis within the right T8 rib head and the left posterior ninth rib.  Review of the MIP images confirms the above findings.  IMPRESSION: 1. Negative for pulmonary embolism. 2. Bibasilar lung opacities is likely a combination of pneumonia and atelectasis. 3. COPD with bronchitis and emphysema. 4. Spine and rib sclerotic lesions, consistent with metastatic spread of the patient's prostate cancer. Given the recently negative bone scan, these may be treated/sterile deposits. Correlate with PSA trend.   Electronically Signed   By: Jorje Guild M.D.   On: 11/21/2013 06:57    I have reviewed the old EKGs from 2012 in the current EKG. There is no significant ST change. In the past there was decreased R wave in V1 and V2. This may be slightly more marked at this time. There is no diagnostic change.   Impression and Recommendations    NSTEMI (non-ST elevated myocardial infarction)  The patient has mild troponin elevation. At this point it is most likely that this is a type II non-STEMI. It is probably from demand ischemia. It seems that his pneumonia is the primary event. However 2-D echo will be checked. We will follow troponin trends. We will then decide how aggressive the workup should be. The patient is quite clear that he would like a full evaluation. This would include cardiac catheterization if it were felt to be appropriate and needed. I do feel it is appropriate to use both aspirin and heparin at this point. Because of his underlying lung disease and a slow resting heart rate, I am not inclined to add a beta blocker. I will start a statin. At this point I do not see any documented problems with statins in the  past. I realize now that he was on atorvastatin 10 mg daily as an outpatient. 2-D echo will be done. We will then decide her in the hospital whether it is appropriate to proceed with nuclear testing or more aggressive testing.    HTN (hypertension)    CAP (community acquired pneumonia)     The patient is receiving medications for the abnormalities on the chest x-ray.    Hyperlipidemia    He had been on atorvastatin at home. I will increase the dose slightly  Daryel November, MD 11/21/2013, 1:32 PM

## 2013-11-21 NOTE — ED Notes (Signed)
Pt to CT via stretcher Pt denies having CP now or ever.  A & O, NAD

## 2013-11-21 NOTE — ED Notes (Signed)
Bed: DU20 Expected date:  Expected time:  Means of arrival:  Comments: Rm 3

## 2013-11-21 NOTE — Progress Notes (Signed)
CRITICAL VALUE ALERT  Critical value received:  Troponin 1.26  Date of notification:  11/21/2013  Time of notification:  2030  Critical value read back:yes  Nurse who received alert:  Raynah Gomes  MD notified (1st page):  No, physician aware of previous higher level, troponin trending down.  Time of first page:  Not notified  MD notified (2nd page):not notified   Time of second page:not notified  Responding MD:  Not notified  Time MD responded:  Not notified

## 2013-11-21 NOTE — Progress Notes (Signed)
ANTICOAGULATION CONSULT NOTE - Initial Consult  Pharmacy Consult for heparin Indication: chest pain/ACS  Allergies  Allergen Reactions  . Penicillins Other (See Comments)    unknown    Patient Measurements: Previous weight: 74.8kg on 09/29/13 IBW: 73kg Heparin Dosing Weight: 75kg  Vital Signs: Temp: 97.9 F (36.6 C) (03/28 2343) Temp src: Oral (03/28 2343) BP: 127/71 mmHg (03/29 0608) Pulse Rate: 79 (03/29 0608)  Labs:  Recent Labs  11/21/13 0137 11/21/13 0511  HGB 14.6  --   HCT 39.5  --   PLT 207  --   CREATININE 0.75  --   TROPONINI 0.48* 1.38*    The CrCl is unknown because both a height and weight (above a minimum accepted value) are required for this calculation.   Medical History: Past Medical History  Diagnosis Date  . Hypertension   . Degenerative disc disease   . Degenerative joint disease   . Genital herpes   . Diverticulitis   . Prostate cancer   . Lumbosacral spondylolysis   . Hip arthritis 09/29/2013    Bilateral    Medications:  Scheduled:   Infusions:  . levofloxacin (LEVAQUIN) IV      Assessment: 87 yoM admitted from NH on 3/29 with SOB. Also started on levofloxacin for URI. Pharmacy has been consulted to dose heparin for r/o ACS.   Initial troponins elevated to 1.38.   CT/angio negative for PE.   EKG does not show any ST changes  D-dimer slightly elevated to 0.95  Renal function WNL  CBC ok  Baseline INR pending  Noted patient on clopidogrel for TIA in 01/2013   Goal of Therapy:  Heparin level 0.3-0.7 units/ml Monitor platelets by anticoagulation protocol: Yes   Plan:  - heparin bolus of 4000 units x1 - heparin gtt at 900 units/hr - STAT PT/INR - heparin level at 1600 - daily heparin level / CBC - monitor for bleeding - follow-up cardiology recommendations  Thank you for the consult.  Johny Drilling, PharmD, BCPS Pager: [phone] Pharmacy: [phone] 11/21/2013 7:30 AM

## 2013-11-21 NOTE — ED Provider Notes (Signed)
CSN: [id number]     Arrival date & time 11/20/13  2335 History   First MD Initiated Contact with Patient 11/21/13 0012     Chief Complaint  Patient presents with  . Shortness of Breath     (Consider location/radiation/quality/duration/timing/severity/associated sxs/prior Treatment) HPI Comments: Pt comes in with cc of dib. Hx of HTN, HL, emphysema. States that this evening, he had sudden episode of dib, described as difficulty getting air in. He also appreciated some exertional component to the dyspnea. Pt has hx of bronchitis, and has a dry cough currently, no chest pains, and feels a lot better after he got some oxygen. No hx of MI, CHF. No fevers, chills.  No hx of PE, DVT, and had an US DVT few weeks ago that was neg. Pt did have orthopedic surgery in November.  Patient is a 80 y.o. male presenting with shortness of breath. The history is provided by the patient.  Shortness of Breath Associated symptoms: cough   Associated symptoms: no abdominal pain and no chest pain     Past Medical History  Diagnosis Date  . Hypertension   . Degenerative disc disease   . Degenerative joint disease   . Genital herpes   . Diverticulitis   . Prostate cancer   . Lumbosacral spondylolysis   . Hip arthritis 09/29/2013    Bilateral   Past Surgical History  Procedure Laterality Date  . Total knee arthroplasty Right 06/21/2013    Procedure: RIGHT TOTAL KNEE ARTHROPLASTY;  Surgeon: Gearlean Alf, MD;  Location: [facility];  Service: Orthopedics;  Laterality: Right;  . Mastoidectomy Bilateral   . Cataract extraction Bilateral   . Tonsillectomy     Family History  Problem Relation Age of Onset  . Heart disease Mother    History  Substance Use Topics  . Smoking status: Former Smoker -- 2.00 packs/day for 15 years    Quit date: 01/14/1984  . Smokeless tobacco: Never Used  . Alcohol Use: 1.2 oz/week    2 Shots of liquor per week     Comment: occasional    Review of Systems  Constitutional:  Negative for activity change and appetite change.  Respiratory: Positive for cough and shortness of breath. Negative for chest tightness.   Cardiovascular: Negative for chest pain.  Gastrointestinal: Negative for abdominal pain.  Genitourinary: Negative for dysuria.  All other systems reviewed and are negative.      Allergies  Penicillins  Home Medications   Current Outpatient Rx  Name  Route  Sig  Dispense  Refill  . acyclovir (ZOVIRAX) 200 MG capsule   Oral   Take 200 mg by mouth daily.          Marland Kitchen amLODipine (NORVASC) 5 MG tablet   Oral   Take 5 mg by mouth daily.         Marland Kitchen aspirin EC 81 MG tablet   Oral   Take 40.5 mg by mouth daily.          Marland Kitchen atorvastatin (LIPITOR) 10 MG tablet   Oral   Take 10 mg by mouth every Monday, Wednesday, and Friday.          . bicalutamide (CASODEX) 50 MG tablet   Oral   Take 50 mg by mouth daily.         . clopidogrel (PLAVIX) 75 MG tablet   Oral   Take 75 mg by mouth daily with breakfast.         . hydrochlorothiazide (  HYDRODIURIL) 50 MG tablet   Oral   Take 50 mg by mouth every morning.          Marland Kitchen lisinopril (PRINIVIL,ZESTRIL) 40 MG tablet   Oral   Take 40 mg by mouth every morning.          Marland Kitchen LORazepam (ATIVAN) 0.5 MG tablet   Oral   Take 0.5 mg by mouth at bedtime as needed for sleep.          . naproxen sodium (ANAPROX) 220 MG tablet   Oral   Take 220 mg by mouth once.          BP 127/71  Pulse 79  Temp(Src) 97.9 F (36.6 C) (Oral)  Resp 23  SpO2 97% Physical Exam  Nursing note and vitals reviewed. Constitutional: He is oriented to person, place, and time. He appears well-developed.  HENT:  Head: Normocephalic and atraumatic.  Eyes: Conjunctivae and EOM are normal. Pupils are equal, round, and reactive to light.  Neck: Normal range of motion. Neck supple.  Cardiovascular: Normal rate and regular rhythm.   Pulmonary/Chest: Effort normal and breath sounds normal. No respiratory distress.  He has no wheezes.  Abdominal: Soft. Bowel sounds are normal. He exhibits no distension. There is no tenderness. There is no rebound and no guarding.  Musculoskeletal:  LLE unilateral swelling  Neurological: He is alert and oriented to person, place, and time.  Skin: Skin is warm.    ED Course  Procedures (including critical care time) Labs Review Labs Reviewed  CBC WITH DIFFERENTIAL - Abnormal; Notable for the following:    WBC 10.8 (*)    MCHC 37.0 (*)    Neutro Abs 8.3 (*)    Lymphocytes Relative 10 (*)    Monocytes Absolute 1.2 (*)    All other components within normal limits  BASIC METABOLIC PANEL - Abnormal; Notable for the following:    Sodium 128 (*)    Chloride 88 (*)    Glucose, Bld 102 (*)    GFR calc non Af Amer 81 (*)    All other components within normal limits  TROPONIN I - Abnormal; Notable for the following:    Troponin I 0.48 (*)    All other components within normal limits  URINALYSIS, ROUTINE W REFLEX MICROSCOPIC - Abnormal; Notable for the following:    Protein, ur 30 (*)    All other components within normal limits  PRO B NATRIURETIC PEPTIDE - Abnormal; Notable for the following:    Pro B Natriuretic peptide (BNP) 656.8 (*)    All other components within normal limits  D-DIMER, QUANTITATIVE - Abnormal; Notable for the following:    D-Dimer, Quant 0.95 (*)    All other components within normal limits  TROPONIN I - Abnormal; Notable for the following:    Troponin I 1.38 (*)    All other components within normal limits  URINE MICROSCOPIC-ADD ON  TROPONIN I   Imaging Review Dg Chest 2 View  11/21/2013   CLINICAL DATA:  Chest pain, shortness of breath and cough.  EXAM: CHEST  2 VIEW  COMPARISON:  DG CHEST 2 VIEW dated 06/27/2013  FINDINGS: Cardiac silhouette is unremarkable and unchanged. Mildly calcified aortic knob come in mediastinal silhouette is nonsuspicious. Similar mild increase interstitial markings with increased lung volumes, flattening of the  hemidiaphragm. Subcentimeter left lung base granuloma with left lung base pleural thickening and scarring. Trachea projects midline and there is no pneumothorax.  Severe degenerative change the left shoulder. Multiple  EKG lines overlie the patient and may obscure subtle underlying pathology.  IMPRESSION: COPD with chronic changes of left lung base, no superimposed acute cardiopulmonary process.   Electronically Signed   By: Elon Alas   On: 11/21/2013 02:06     EKG Interpretation None      Date: 11/21/2013  Rate: 65  Rhythm: normal sinus rhythm  QRS Axis: normal  Intervals: normal  ST/T Wave abnormalities: normal  Conduction Disutrbances: none  Narrative Interpretation: unremarkable      MDM   Final diagnoses:  None    Differential diagnosis includes: ACS syndrome CHF exacerbation Valvular disorder Myocarditis Pericarditis Pericardial effusion Pneumonia Pleural effusion Pulmonary edema PE Anemia Musculoskeletal pain   Pt comes in with dib, weakness. Exetional component to his dyspnea. EKG is not acute, and CXR is clear. Exam is non focal. However, troponin is elevated. I ordered a D-dimer, given no chest pain, and it too was elevated. CT PE is negative for any PE, but there might be an infection - levaquin given. In the interval - 2nd troponin was noted to be higher. EKG is still not showing any acute pathology, and there is no chest pain.  Dr. Etter Sjogren [facility] consulted -and they will see the patient at [facility]. Hospitalist to transfer patient to [facility] from [facility].  DDX: Severe sepsis NSTEMI   CRITICAL CARE Performed by: Varney Biles   Total critical care time: 60 minutes  Critical care time was exclusive of separately billable procedures and treating other patients.  Critical care was necessary to treat or prevent imminent or life-threatening deterioration.  Critical care was time spent personally by me on the following activities: development of  treatment plan with patient and/or surrogate as well as nursing, discussions with consultants, evaluation of patient's response to treatment, examination of patient, obtaining history from patient or surrogate, ordering and performing treatments and interventions, ordering and review of laboratory studies, ordering and review of radiographic studies, pulse oximetry and re-evaluation of patient's condition.   Varney Biles, MD 11/21/13 [phone]

## 2013-11-22 DIAGNOSIS — I219 Acute myocardial infarction, unspecified: Secondary | ICD-10-CM

## 2013-11-22 LAB — CBC
HCT: 33.6 % — ABNORMAL LOW (ref 39.0–52.0)
HEMOGLOBIN: 12.1 g/dL — AB (ref 13.0–17.0)
MCH: 33.4 pg (ref 26.0–34.0)
MCHC: 36 g/dL (ref 30.0–36.0)
MCV: 92.8 fL (ref 78.0–100.0)
Platelets: 148 10*3/uL — ABNORMAL LOW (ref 150–400)
RBC: 3.62 MIL/uL — ABNORMAL LOW (ref 4.22–5.81)
RDW: 13.7 % (ref 11.5–15.5)
WBC: 11.5 10*3/uL — ABNORMAL HIGH (ref 4.0–10.5)

## 2013-11-22 LAB — LEGIONELLA ANTIGEN, URINE: Legionella Antigen, Urine: NEGATIVE

## 2013-11-22 LAB — TROPONIN I
Troponin I: 1.47 ng/mL (ref <0.30)
Troponin I: 0.95 ng/mL (ref <0.30)

## 2013-11-22 LAB — LIPID PANEL
CHOL/HDL RATIO: 1.7 ratio
Cholesterol: 132 mg/dL (ref 0–200)
HDL: 77 mg/dL (ref >39)
LDL CALC: 44 mg/dL (ref 0–99)
Triglycerides: 55 mg/dL (ref <150)
VLDL: 11 mg/dL (ref 0–40)

## 2013-11-22 LAB — BASIC METABOLIC PANEL
BUN: 12 mg/dL (ref 6–23)
CO2: 26 meq/L (ref 19–32)
Calcium: 8.3 mg/dL — ABNORMAL LOW (ref 8.4–10.5)
Chloride: 91 mEq/L — ABNORMAL LOW (ref 96–112)
Creatinine, Ser: 0.82 mg/dL (ref 0.50–1.35)
GFR calc Af Amer: >90 mL/min (ref >90)
GFR, EST NON AFRICAN AMERICAN: 78 mL/min — AB (ref >90)
GLUCOSE: 94 mg/dL (ref 70–99)
Potassium: 3 mEq/L — ABNORMAL LOW (ref 3.7–5.3)
Sodium: 132 mEq/L — ABNORMAL LOW (ref 137–147)

## 2013-11-22 LAB — HEPARIN LEVEL (UNFRACTIONATED)
HEPARIN UNFRACTIONATED: 0.23 [IU]/mL — AB (ref 0.30–0.70)
HEPARIN UNFRACTIONATED: 0.53 [IU]/mL (ref 0.30–0.70)
Heparin Unfractionated: 0.32 IU/mL (ref 0.30–0.70)

## 2013-11-22 LAB — HIV ANTIBODY (ROUTINE TESTING W REFLEX): HIV: NONREACTIVE

## 2013-11-22 LAB — INFLUENZA PANEL BY PCR (TYPE A & B)
H1N1 flu by pcr: NOT DETECTED
Influenza A By PCR: NEGATIVE
Influenza B By PCR: NEGATIVE

## 2013-11-22 MED ORDER — HEPARIN BOLUS VIA INFUSION
2000.0000 [IU] | Freq: Once | INTRAVENOUS | Status: AC
  Administered 2013-11-22: 2000 [IU] via INTRAVENOUS
  Filled 2013-11-22: qty 2000

## 2013-11-22 MED ORDER — POTASSIUM CHLORIDE 20 MEQ/15ML (10%) PO LIQD
40.0000 meq | ORAL | Status: AC
  Administered 2013-11-22 (×2): 40 meq via ORAL
  Filled 2013-11-22 (×2): qty 30

## 2013-11-22 MED ORDER — HEPARIN (PORCINE) IN NACL 100-0.45 UNIT/ML-% IJ SOLN
1450.0000 [IU]/h | INTRAMUSCULAR | Status: DC
  Administered 2013-11-23 (×2): 1300 [IU]/h via INTRAVENOUS
  Filled 2013-11-22 (×5): qty 250

## 2013-11-22 NOTE — Progress Notes (Signed)
PROGRESS NOTE  Craig [name] YNW:[id number] DOB: 1926-03-12 DOA: 11/20/2013 PCP: Myriam Jacobson, MD  Assessment/Plan: Non-ST elevated MI - type II likely, 2D echo pending. Cardiology consulted, appreciate input.  -Continue aspirin, heparin drip.  Community-acquired pneumonia -Continue Levaquin given penicillin allergy.  Hyperlipidemia - Check fasting lipid profile.  Hypertension -Currently well-controlled  Diet: heart healthy Fluids: none DVT Prophylaxis: heparin gtt  Code Status: Full Family Communication: daughter bedside  Disposition Plan: inpatient  Consultants:  Cardiology   Procedures:  2D echo    Antibiotics Levofloxacin 3/29 >>  HPI/Subjective: - denies chest pain this morning, breathing better.   Objective: Filed Vitals:   11/21/13 1026 11/21/13 1146 11/21/13 2110 11/22/13 0527  BP: 124/65 115/57 116/61 101/57  Pulse: 76 69 64 57  Temp:  97.8 F (36.6 C) 99.4 F (37.4 C) 97.8 F (36.6 C)  TempSrc:  Oral Oral Oral  Resp: 22 18 18 18   Height:  5\' 10"  (1.778 m)    Weight:  72.4 kg (159 lb 9.8 oz)    SpO2: 97% 97% 97% 99%    Intake/Output Summary (Last 24 hours) at 11/22/13 1141 Last data filed at 11/22/13 6578  Gross per 24 hour  Intake    480 ml  Output   1725 ml  Net  -1245 ml   Filed Weights   11/21/13 1146  Weight: 72.4 kg (159 lb 9.8 oz)   Exam:  General:  NAD  Cardiovascular: regular rate and rhythm, without MRG  Respiratory: good air movement, clear to auscultation throughout, no wheezing, ronchi or rales  Abdomen: soft, not tender to palpation, positive bowel sounds  MSK: no peripheral edema  Neuro: non focal  Data Reviewed: Basic Metabolic Panel:  Recent Labs Lab 11/21/13 0137 11/22/13 0355  NA 128* 132*  K 4.6 3.0*  CL 88* 91*  CO2 25 26  GLUCOSE 102* 94  BUN 16 12  CREATININE 0.75 0.82  CALCIUM 9.6 8.3*   CBC:  Recent Labs Lab 11/21/13 0137 11/22/13 0355  WBC 10.8* 11.5*  NEUTROABS 8.3*  --    HGB 14.6 12.1*  HCT 39.5 33.6*  MCV 91.0 92.8  PLT 207 148*   Cardiac Enzymes:  Recent Labs Lab 11/21/13 0137 11/21/13 0511 11/21/13 1845 11/22/13 0045 11/22/13 0727  TROPONINI 0.48* 1.38* 1.26* 1.47* 0.95*   BNP (last 3 results)  Recent Labs  06/25/13 0805 11/21/13 0137  PROBNP 513.9* 656.8*   CBG: No results found for this basename: GLUCAP,  in the last 168 hours  Recent Results (from the past 240 hour(s))  CULTURE, BLOOD (ROUTINE X 2)     Status: None   Collection Time    11/21/13  1:50 PM      Result Value Ref Range Status   Specimen Description BLOOD LEFT ARM   Final   Special Requests BOTTLES DRAWN AEROBIC AND ANAEROBIC 10CC   Final   Culture  Setup Time     Final   Value: 11/21/2013 21:20     Performed at [facility]   Culture     Final   Value:        BLOOD CULTURE RECEIVED NO GROWTH TO DATE CULTURE WILL BE HELD FOR 5 DAYS BEFORE ISSUING A FINAL NEGATIVE REPORT     Performed at [facility]   Report Status PENDING   Incomplete  CULTURE, BLOOD (ROUTINE X 2)     Status: None   Collection Time    11/21/13  1:58 PM  Result Value Ref Range Status   Specimen Description BLOOD LEFT HAND   Final   Special Requests BOTTLES DRAWN AEROBIC AND ANAEROBIC 10CC   Final   Culture  Setup Time     Final   Value: 11/21/2013 21:20     Performed at [facility]   Culture     Final   Value:        BLOOD CULTURE RECEIVED NO GROWTH TO DATE CULTURE WILL BE HELD FOR 5 DAYS BEFORE ISSUING A FINAL NEGATIVE REPORT     Performed at [facility]   Report Status PENDING   Incomplete     Studies: Dg Chest 2 View  11/21/2013   CLINICAL DATA:  Chest pain, shortness of breath and cough.  EXAM: CHEST  2 VIEW  COMPARISON:  DG CHEST 2 VIEW dated 06/27/2013  FINDINGS: Cardiac silhouette is unremarkable and unchanged. Mildly calcified aortic knob come in mediastinal silhouette is nonsuspicious. Similar mild increase interstitial markings with increased  lung volumes, flattening of the hemidiaphragm. Subcentimeter left lung base granuloma with left lung base pleural thickening and scarring. Trachea projects midline and there is no pneumothorax.  Severe degenerative change the left shoulder. Multiple EKG lines overlie the patient and may obscure subtle underlying pathology.  IMPRESSION: COPD with chronic changes of left lung base, no superimposed acute cardiopulmonary process.   Electronically Signed   By: Elon Alas   On: 11/21/2013 02:06   Ct Angio Chest Pe W/cm &/or Wo Cm  11/21/2013   CLINICAL DATA:  Bronchitis and shortness of breath. Rule out pulmonary embolism.  EXAM: CT ANGIOGRAPHY CHEST WITH CONTRAST  TECHNIQUE: Multidetector CT imaging of the chest was performed using the standard protocol during bolus administration of intravenous contrast. Multiplanar CT image reconstructions and MIPs were obtained to evaluate the vascular anatomy.  CONTRAST:  120mL OMNIPAQUE IOHEXOL 350 MG/ML SOLN  COMPARISON:  10/04/2010  FINDINGS: THORACIC INLET/BODY WALL:  No acute abnormality.  MEDIASTINUM:  Normal heart size. No pericardial effusion. No acute vascular abnormality, including pulmonary embolism. Diffuse atherosclerosis, including the coronary arteries. Calcified lymph nodes in the left hilum. No adenopathy.  LUNG WINDOWS:  Background COPD with both bronchial wall thickening and centrilobular emphysema. There is fluid levels within the bilateral mainstem bronchi. Interlobular septal thickening in the lower lungs were there is also ground-glass opacity. There are similar, smaller opacities in the right middle lobe and apical segment left lower lobe. Calcified granuloma at the left base.  UPPER ABDOMEN:  Cystic changes in the upper left kidney.  OSSEOUS:  There are numerous sclerotic lesions within the imaged spine and ribs, some of which have developed since comparison CT imaging in 2012. The largest are in the T9 (which is remote) and T10 (which is new)  vertebral body. There is also abnormal sclerosis within the right T8 rib head and the left posterior ninth rib.  Review of the MIP images confirms the above findings.  IMPRESSION: 1. Negative for pulmonary embolism. 2. Bibasilar lung opacities is likely a combination of pneumonia and atelectasis. 3. COPD with bronchitis and emphysema. 4. Spine and rib sclerotic lesions, consistent with metastatic spread of the patient's prostate cancer. Given the recently negative bone scan, these may be treated/sterile deposits. Correlate with PSA trend.   Electronically Signed   By: Jorje Guild M.D.   On: 11/21/2013 06:57    Scheduled Meds: . aspirin EC  81 mg Oral Daily  . atorvastatin  20 mg Oral q1800  . levofloxacin (  LEVAQUIN) IV  750 mg Intravenous Q24H  . potassium chloride  40 mEq Oral Q4H  . sodium chloride  3 mL Intravenous Q12H  . sodium chloride  3 mL Intravenous Q12H   Continuous Infusions: . heparin 1,100 Units/hr (11/22/13 0209)    Principal Problem:   NSTEMI (non-ST elevated myocardial infarction) Active Problems:   HTN (hypertension)   CAP (community acquired pneumonia)   Hyperlipidemia  Time spent: 35  This note has been created with Surveyor, quantity. Any transcriptional errors are unintentional.   Marzetta Board, MD [facility] Pager [phone]. If 7 PM - 7 AM, please contact night-coverage at [email or web address], password [facility] 11/22/2013, 11:41 AM  LOS: 2 days

## 2013-11-22 NOTE — Progress Notes (Signed)
  Echocardiogram 2D Echocardiogram has been performed.  Craig [name] 11/22/2013, 10:45 AM

## 2013-11-22 NOTE — Progress Notes (Signed)
Utilization Review Completed.Craig [name] T3/30/2015  

## 2013-11-22 NOTE — Progress Notes (Signed)
Patient ID: Craig [name], male   DOB: 09/15/1925, 80 y.o.   MRN: [id number]    SUBJECTIVE: Patient is feeling much better. His cough is now producing discolored sputum. He was not having this before. This seems to fit with the overall clinical diagnosis of pneumonia. His troponins peaked at 1.4. Two-dimensional echo is to be done today.   Filed Vitals:   11/21/13 1026 11/21/13 1146 11/21/13 2110 11/22/13 0527  BP: 124/65 115/57 116/61 101/57  Pulse: 76 69 64 57  Temp:  97.8 F (36.6 C) 99.4 F (37.4 C) 97.8 F (36.6 C)  TempSrc:  Oral Oral Oral  Resp: 22 18 18 18   Height:  5\' 10"  (1.778 m)    Weight:  159 lb 9.8 oz (72.4 kg)    SpO2: 97% 97% 97% 99%     Intake/Output Summary (Last 24 hours) at 11/22/13 0831 Last data filed at 11/22/13 0500  Gross per 24 hour  Intake    240 ml  Output   1725 ml  Net  -1485 ml    LABS: Basic Metabolic Panel:  Recent Labs  11/21/13 0137 11/22/13 0355  NA 128* 132*  K 4.6 3.0*  CL 88* 91*  CO2 25 26  GLUCOSE 102* 94  BUN 16 12  CREATININE 0.75 0.82  CALCIUM 9.6 8.3*   Liver Function Tests: No results found for this basename: AST, ALT, ALKPHOS, BILITOT, PROT, ALBUMIN,  in the last 72 hours No results found for this basename: LIPASE, AMYLASE,  in the last 72 hours CBC:  Recent Labs  11/21/13 0137 11/22/13 0355  WBC 10.8* 11.5*  NEUTROABS 8.3*  --   HGB 14.6 12.1*  HCT 39.5 33.6*  MCV 91.0 92.8  PLT 207 148*   Cardiac Enzymes:  Recent Labs  11/21/13 1845 11/22/13 0045 11/22/13 0727  TROPONINI 1.26* 1.47* 0.95*   BNP: No components found with this basename: POCBNP,  D-Dimer:  Recent Labs  11/21/13 0239  DDIMER 0.95*   Hemoglobin A1C: No results found for this basename: HGBA1C,  in the last 72 hours Fasting Lipid Panel:  Recent Labs  11/22/13 0355  CHOL 132  HDL 77  LDLCALC 44  TRIG 55  CHOLHDL 1.7   Thyroid Function Tests: No results found for this basename: TSH, T4TOTAL, FREET3, T3FREE,  THYROIDAB,  in the last 72 hours  RADIOLOGY: Dg Chest 2 View  11/21/2013   CLINICAL DATA:  Chest pain, shortness of breath and cough.  EXAM: CHEST  2 VIEW  COMPARISON:  DG CHEST 2 VIEW dated 06/27/2013  FINDINGS: Cardiac silhouette is unremarkable and unchanged. Mildly calcified aortic knob come in mediastinal silhouette is nonsuspicious. Similar mild increase interstitial markings with increased lung volumes, flattening of the hemidiaphragm. Subcentimeter left lung base granuloma with left lung base pleural thickening and scarring. Trachea projects midline and there is no pneumothorax.  Severe degenerative change the left shoulder. Multiple EKG lines overlie the patient and may obscure subtle underlying pathology.  IMPRESSION: COPD with chronic changes of left lung base, no superimposed acute cardiopulmonary process.   Electronically Signed   By: Elon Alas   On: 11/21/2013 02:06   Ct Angio Chest Pe W/cm &/or Wo Cm  11/21/2013   CLINICAL DATA:  Bronchitis and shortness of breath. Rule out pulmonary embolism.  EXAM: CT ANGIOGRAPHY CHEST WITH CONTRAST  TECHNIQUE: Multidetector CT imaging of the chest was performed using the standard protocol during bolus administration of intravenous contrast. Multiplanar CT image reconstructions and MIPs were  obtained to evaluate the vascular anatomy.  CONTRAST:  19mL OMNIPAQUE IOHEXOL 350 MG/ML SOLN  COMPARISON:  10/04/2010  FINDINGS: THORACIC INLET/BODY WALL:  No acute abnormality.  MEDIASTINUM:  Normal heart size. No pericardial effusion. No acute vascular abnormality, including pulmonary embolism. Diffuse atherosclerosis, including the coronary arteries. Calcified lymph nodes in the left hilum. No adenopathy.  LUNG WINDOWS:  Background COPD with both bronchial wall thickening and centrilobular emphysema. There is fluid levels within the bilateral mainstem bronchi. Interlobular septal thickening in the lower lungs were there is also ground-glass opacity. There are  similar, smaller opacities in the right middle lobe and apical segment left lower lobe. Calcified granuloma at the left base.  UPPER ABDOMEN:  Cystic changes in the upper left kidney.  OSSEOUS:  There are numerous sclerotic lesions within the imaged spine and ribs, some of which have developed since comparison CT imaging in 2012. The largest are in the T9 (which is remote) and T10 (which is new) vertebral body. There is also abnormal sclerosis within the right T8 rib head and the left posterior ninth rib.  Review of the MIP images confirms the above findings.  IMPRESSION: 1. Negative for pulmonary embolism. 2. Bibasilar lung opacities is likely a combination of pneumonia and atelectasis. 3. COPD with bronchitis and emphysema. 4. Spine and rib sclerotic lesions, consistent with metastatic spread of the patient's prostate cancer. Given the recently negative bone scan, these may be treated/sterile deposits. Correlate with PSA trend.   Electronically Signed   By: Jorje Guild M.D.   On: 11/21/2013 06:57    PHYSICAL EXAM patient is oriented to person time and place. Affect is normal. His daughter is in the room. There is no jugulovenous distention. Lungs reveal diffuse rhonchi. Cardiac exam reveals S1 and S2. The abdomen is soft. There is no peripheral edema.   TELEMETRY: I have personally reviewed telemetry today November 22, 2013. There is normal sinus rhythm.   ASSESSMENT AND PLAN:    NSTEMI (non-ST elevated myocardial infarction)    At this point I still feel that this most likely is type II non-STEMI. I suspect this was based on demand ischemia when he had his marked acute shortness of breath from his pneumonia. 2-D echo will be done to assess LV function. If he has significant wall motion abnormalities, I may still decide to proceed with catheterization with his troponin abnormalities. If his wall motion is normal we will probably proceed with a risk stratifying pharmacologic stress test.    HTN  (hypertension)    CAP (community acquired pneumonia)       His cough has now become productive with discolored sputum. I believe this is the primary issue this admission. He is feeling better.    Hyperlipidemia   [name] 11/22/2013 8:31 AM

## 2013-11-22 NOTE — Progress Notes (Signed)
Pharmacy Note-Anticoagulation  Pharmacy Consult :  80 y.o. male is currently on Heparin for NSTEMI.   Latest Labs : Hematology :  Recent Labs  11/21/13 0137 11/21/13 0815 11/21/13 1845 11/22/13 0355 11/22/13 1127 11/22/13 2100  HGB 14.6  --   --  12.1*  --   --   HCT 39.5  --   --  33.6*  --   --   PLT 207  --   --  148*  --   --   LABPROT  --  14.4  --   --   --   --   INR  --  1.14  --   --   --   --   HEPARINUNFRC  --   --  <0.10* 0.32 0.23* 0.53  CREATININE 0.75  --   --  0.82  --   --    Heparin dosing Weight:  72 kg  Infusion[s]: Infusions:  . heparin 1,300 Units/hr (11/22/13 1300)    Assessment :  Heparin level is now therapeutic at 0.55 on IV rate of 1300 units/hr.  No bleeding complications observed.  Goal :  Heparin goal is Heparin level 0.3-0.7 units/ml.  Plan : 1.  Continue IV Heparin at 1300 units/hr 2.  Heparin level and CBC in AM 3.  Monitor for bleeding complications.  Rober Minion, PharmD., MS Clinical Pharmacist Pager:  [phone] Thank you for allowing pharmacy to be part of this patients care team. 11/22/2013  10:01 PM

## 2013-11-22 NOTE — Progress Notes (Signed)
ANTICOAGULATION CONSULT NOTE - Follow Up Consult  Pharmacy Consult for Heparin Indication: chest pain/ACS  Allergies  Allergen Reactions  . Penicillins Other (See Comments)    unknown    Patient Measurements: Height: 5\' 10"  (177.8 cm) Weight: 159 lb 9.8 oz (72.4 kg) IBW/kg (Calculated) : 73 Heparin Dosing Weight: 72 kg  Vital Signs: Temp: 97.8 F (36.6 C) (03/30 0527) Temp src: Oral (03/30 0527) BP: 101/57 mmHg (03/30 0527) Pulse Rate: 57 (03/30 0527)  Labs:  Recent Labs  11/21/13 0137 11/21/13 0511 11/21/13 0815 11/21/13 1845 11/22/13 0045 11/22/13 0355  HGB 14.6  --   --   --   --   --   HCT 39.5  --   --   --   --   --   PLT 207  --   --   --   --   --   LABPROT  --   --  14.4  --   --   --   INR  --   --  1.14  --   --   --   HEPARINUNFRC  --   --   --  <0.10*  --  0.32  CREATININE 0.75  --   --   --   --   --   TROPONINI 0.48* 1.38*  --  1.26* 1.47*  --     Estimated Creatinine Clearance: 69.1 ml/min (by C-G formula based on Cr of 0.75).   Medications:  Heparin @ 1100 units/hr  Assessment: 80 y.o. M who presented to the [facility] wit SOB and elevated troponins and was transferred to [facility] for management of NSTEMI and work-up for ACS and possible cardiac cath. D-dimer was also noted to be positive however CT scan was negative for PE.  Heparin level therapeutic at 0.32 units/ml   Goal of Therapy:  Heparin level 0.3-0.7 units/ml Monitor platelets by anticoagulation protocol: Yes   Plan:  1. Cont heparin drip at 1100 units/hr 2.  Recheck HL in 6 hours to confirm  Excell Seltzer, PharmD Clinical Pharmacist Pager: [phone] 11/22/2013 5:33 AM

## 2013-11-22 NOTE — Progress Notes (Signed)
Pharmacy Note-Anticoagulation  Pharmacy Consult :  80 y.o. male is currently on Heparin for NSTEMI.   Latest Labs : Hematology :  Recent Labs  11/21/13 0137 11/21/13 0815 11/21/13 1845 11/22/13 0355 11/22/13 1127  HGB 14.6  --   --  12.1*  --   HCT 39.5  --   --  33.6*  --   PLT 207  --   --  148*  --   LABPROT  --  14.4  --   --   --   INR  --  1.14  --   --   --   HEPARINUNFRC  --   --  <0.10* 0.32 0.23*  CREATININE 0.75  --   --  0.82  --    Heparin dosing Weight:  72 kg  Infusion[s]: Infusions:  . heparin 1,100 Units/hr (11/22/13 0209)    Assessment :  Heparin level has drifted below therapeutic threshold, 0.23 units/ml..  No bleeding complications observed.  Goal :  Heparin goal is Heparin level 0.3-0.7 units/ml.  Plan : 1.  Heparin bolus 2000 units IV now,   Then increase Heparin infusion to 1300 units/hr 2.  Next Heparin level due in 8 hours. 3.  Daily heparin level and CBC.  Monitor for bleeding complications.  Mindee Robledo, Craig Guess, Pharm.D. 11/22/2013  12:47 PM

## 2013-11-23 ENCOUNTER — Other Ambulatory Visit: Payer: Self-pay

## 2013-11-23 ENCOUNTER — Inpatient Hospital Stay (HOSPITAL_COMMUNITY): Payer: Medicare Other

## 2013-11-23 DIAGNOSIS — R079 Chest pain, unspecified: Secondary | ICD-10-CM

## 2013-11-23 LAB — CBC
HCT: 33.5 % — ABNORMAL LOW (ref 39.0–52.0)
Hemoglobin: 11.9 g/dL — ABNORMAL LOW (ref 13.0–17.0)
MCH: 33.3 pg (ref 26.0–34.0)
MCHC: 35.5 g/dL (ref 30.0–36.0)
MCV: 93.8 fL (ref 78.0–100.0)
PLATELETS: 149 10*3/uL — AB (ref 150–400)
RBC: 3.57 MIL/uL — AB (ref 4.22–5.81)
RDW: 13.7 % (ref 11.5–15.5)
WBC: 9.1 10*3/uL (ref 4.0–10.5)

## 2013-11-23 LAB — BASIC METABOLIC PANEL
BUN: 13 mg/dL (ref 6–23)
CO2: 24 meq/L (ref 19–32)
CREATININE: 0.79 mg/dL (ref 0.50–1.35)
Calcium: 8.1 mg/dL — ABNORMAL LOW (ref 8.4–10.5)
Chloride: 97 mEq/L (ref 96–112)
GFR, EST NON AFRICAN AMERICAN: 80 mL/min — AB (ref >90)
Glucose, Bld: 96 mg/dL (ref 70–99)
Potassium: 4 mEq/L (ref 3.7–5.3)
SODIUM: 133 meq/L — AB (ref 137–147)

## 2013-11-23 LAB — HEPARIN LEVEL (UNFRACTIONATED): Heparin Unfractionated: 0.38 IU/mL (ref 0.30–0.70)

## 2013-11-23 LAB — PROTIME-INR
INR: 1.2 (ref 0.00–1.49)
Prothrombin Time: 14.9 seconds (ref 11.6–15.2)

## 2013-11-23 MED ORDER — TECHNETIUM TC 99M SESTAMIBI GENERIC - CARDIOLITE
30.0000 | Freq: Once | INTRAVENOUS | Status: AC | PRN
  Administered 2013-11-23: 30 via INTRAVENOUS

## 2013-11-23 MED ORDER — TECHNETIUM TC 99M SESTAMIBI GENERIC - CARDIOLITE
10.0000 | Freq: Once | INTRAVENOUS | Status: AC | PRN
  Administered 2013-11-23: 10 via INTRAVENOUS

## 2013-11-23 MED ORDER — GUAIFENESIN 100 MG/5ML PO SYRP
200.0000 mg | ORAL_SOLUTION | ORAL | Status: DC | PRN
  Administered 2013-11-23: 200 mg via ORAL
  Filled 2013-11-23: qty 10

## 2013-11-23 MED ORDER — REGADENOSON 0.4 MG/5ML IV SOLN
0.4000 mg | Freq: Once | INTRAVENOUS | Status: DC
  Filled 2013-11-23: qty 5

## 2013-11-23 MED ORDER — REGADENOSON 0.4 MG/5ML IV SOLN
INTRAVENOUS | Status: AC
  Administered 2013-11-23: 0.4 mg
  Filled 2013-11-23: qty 5

## 2013-11-23 NOTE — Progress Notes (Signed)
Stress test completed

## 2013-11-23 NOTE — Progress Notes (Addendum)
PROGRESS NOTE  Craig [name] WIO:[id number] DOB: 1925/09/04 DOA: 11/20/2013 PCP: Myriam Jacobson, MD  Assessment/Plan: Non-ST elevated MI - type II likely, 2D echo pending. Cardiology consulted, appreciate input, plan for stress test today -Continue aspirin, heparin drip.  Community-acquired pneumonia -Continue Levaquin given penicillin allergy.  - improving, wean off oxygen. No oxygen at home.  Hyperlipidemia - Check fasting lipid profile.  Hypertension - Currently well-controlled Deconditioning - following knee replacement, baseline back pain. Will obtain PT consult once stress test completed.   Diet: heart healthy Fluids: none DVT Prophylaxis: heparin gtt  Code Status: Full Family Communication: daughter bedside  Disposition Plan: inpatient  Consultants:  Cardiology   Procedures:  2D echo    Antibiotics Levofloxacin 3/29 >>  HPI/Subjective: - no shortness of breath this morning, denies chest pain  Objective: Filed Vitals:   11/22/13 0527 11/22/13 1647 11/22/13 2213 11/23/13 0452  BP: 101/57 120/57 120/68 115/63  Pulse: 57 61 63 66  Temp: 97.8 F (36.6 C) 98.4 F (36.9 C) 98.8 F (37.1 C) 98.1 F (36.7 C)  TempSrc: Oral Oral Oral Oral  Resp: 18 18 18 17   Height:      Weight:      SpO2: 99% 99% 100% 100%    Intake/Output Summary (Last 24 hours) at 11/23/13 4268 Last data filed at 11/22/13 1229  Gross per 24 hour  Intake    480 ml  Output      0 ml  Net    480 ml   Filed Weights   11/21/13 1146  Weight: 72.4 kg (159 lb 9.8 oz)   Exam:  General:  NAD  Cardiovascular: regular rate and rhythm, without MRG  Respiratory: good air movement, clear to auscultation throughout, no wheezing, ronchi or rales  Abdomen: soft, not tender to palpation, positive bowel sounds  MSK: no peripheral edema  Neuro: non focal  Data Reviewed: Basic Metabolic Panel:  Recent Labs Lab 11/21/13 0137 11/22/13 0355  NA 128* 132*  K 4.6 3.0*  CL 88*  91*  CO2 25 26  GLUCOSE 102* 94  BUN 16 12  CREATININE 0.75 0.82  CALCIUM 9.6 8.3*   CBC:  Recent Labs Lab 11/21/13 0137 11/22/13 0355 11/23/13 0501  WBC 10.8* 11.5* 9.1  NEUTROABS 8.3*  --   --   HGB 14.6 12.1* 11.9*  HCT 39.5 33.6* 33.5*  MCV 91.0 92.8 93.8  PLT 207 148* 149*   Cardiac Enzymes:  Recent Labs Lab 11/21/13 0137 11/21/13 0511 11/21/13 1845 11/22/13 0045 11/22/13 0727  TROPONINI 0.48* 1.38* 1.26* 1.47* 0.95*   BNP (last 3 results)  Recent Labs  06/25/13 0805 11/21/13 0137  PROBNP 513.9* 656.8*   Recent Results (from the past 240 hour(s))  CULTURE, BLOOD (ROUTINE X 2)     Status: None   Collection Time    11/21/13  1:50 PM      Result Value Ref Range Status   Specimen Description BLOOD LEFT ARM   Final   Special Requests BOTTLES DRAWN AEROBIC AND ANAEROBIC 10CC   Final   Culture  Setup Time     Final   Value: 11/21/2013 21:20     Performed at [facility]   Culture     Final   Value:        BLOOD CULTURE RECEIVED NO GROWTH TO DATE CULTURE WILL BE HELD FOR 5 DAYS BEFORE ISSUING A FINAL NEGATIVE REPORT     Performed at [facility]   Report  Status PENDING   Incomplete  CULTURE, BLOOD (ROUTINE X 2)     Status: None   Collection Time    11/21/13  1:58 PM      Result Value Ref Range Status   Specimen Description BLOOD LEFT HAND   Final   Special Requests BOTTLES DRAWN AEROBIC AND ANAEROBIC 10CC   Final   Culture  Setup Time     Final   Value: 11/21/2013 21:20     Performed at [facility]   Culture     Final   Value:        BLOOD CULTURE RECEIVED NO GROWTH TO DATE CULTURE WILL BE HELD FOR 5 DAYS BEFORE ISSUING A FINAL NEGATIVE REPORT     Performed at [facility]   Report Status PENDING   Incomplete   Studies: No results found.  Scheduled Meds: . aspirin EC  81 mg Oral Daily  . atorvastatin  20 mg Oral q1800  . levofloxacin (LEVAQUIN) IV  750 mg Intravenous Q24H  . sodium chloride  3 mL Intravenous Q12H   . sodium chloride  3 mL Intravenous Q12H   Continuous Infusions: . heparin 1,300 Units/hr (11/23/13 0000)   Principal Problem:   NSTEMI (non-ST elevated myocardial infarction) Active Problems:   HTN (hypertension)   CAP (community acquired pneumonia)   Hyperlipidemia  Time spent: 25  This note has been created with Surveyor, quantity. Any transcriptional errors are unintentional.   Marzetta Board, MD [facility] Pager [phone]. If 7 PM - 7 AM, please contact night-coverage at [email or web address], password [facility] 11/23/2013, 6:33 AM  LOS: 3 days

## 2013-11-23 NOTE — Progress Notes (Signed)
Pharmacy Note-Anticoagulation  Pharmacy Consult :  80 y.o. male is currently on Heparin for NSTEMI.   Latest Labs : Hematology :  Recent Labs  11/21/13 0137 11/21/13 0815 11/21/13 1845 11/22/13 0355 11/22/13 1127 11/22/13 2100 11/23/13 0501  HGB 14.6  --   --  12.1*  --   --  11.9*  HCT 39.5  --   --  33.6*  --   --  33.5*  PLT 207  --   --  148*  --   --  149*  LABPROT  --  14.4  --   --   --   --   --   INR  --  1.14  --   --   --   --   --   HEPARINUNFRC  --   --  <0.10* 0.32 0.23* 0.53 0.38  CREATININE 0.75  --   --  0.82  --   --  0.79   Current Medication[s] Include: Medication PTA: Prescriptions prior to admission  Medication Sig Dispense Refill  . acyclovir (ZOVIRAX) 200 MG capsule Take 200 mg by mouth daily.       Marland Kitchen amLODipine (NORVASC) 5 MG tablet Take 5 mg by mouth daily.      Marland Kitchen aspirin EC 81 MG tablet Take 40.5 mg by mouth daily.       Marland Kitchen atorvastatin (LIPITOR) 10 MG tablet Take 10 mg by mouth every Monday, Wednesday, and Friday.       . bicalutamide (CASODEX) 50 MG tablet Take 50 mg by mouth daily.      . clopidogrel (PLAVIX) 75 MG tablet Take 75 mg by mouth daily with breakfast.      . hydrochlorothiazide (HYDRODIURIL) 50 MG tablet Take 50 mg by mouth every morning.       Marland Kitchen lisinopril (PRINIVIL,ZESTRIL) 40 MG tablet Take 40 mg by mouth every morning.       Marland Kitchen LORazepam (ATIVAN) 0.5 MG tablet Take 0.5 mg by mouth at bedtime as needed for sleep.       . naproxen sodium (ANAPROX) 220 MG tablet Take 220 mg by mouth once.       Scheduled:  Scheduled:  . aspirin EC  81 mg Oral Daily  . atorvastatin  20 mg Oral q1800  . levofloxacin (LEVAQUIN) IV  750 mg Intravenous Q24H  . sodium chloride  3 mL Intravenous Q12H  . sodium chloride  3 mL Intravenous Q12H   Infusion[s]: Infusions:  . heparin 1,300 Units/hr (11/23/13 0000)    Assessment :  Heparin level is within therapeutic range at 0.38 units/ml.  No bleeding complications observed.  Patient to receive  Nuclear Stress Test for risk stratification.  Goal :  Heparin goal is Heparin level 0.3-0.7 units/ml.  Plan : 1. Heparin will be continued at the same rate.   The next Heparin Level will be with AM labs. 2. Daily Heparin level, CBC while on Heparin.  Monitor for bleeding complications.   Cicero Noy, Craig Guess, Pharm.D. 11/23/2013  9:44 AM

## 2013-11-23 NOTE — Progress Notes (Addendum)
Patient ID: Craig [name], male   DOB: 02-26-1926, 80 y.o.   MRN: [id number]    SUBJECTIVE: Patient continues to improve. Echo yesterday showed normal left jugular function. With this in mind, we will proceed with nuclear stress testing in the hospital to risk stratify him. He is now back from this procedure. He looks well.   Filed Vitals:   11/22/13 0527 11/22/13 1647 11/22/13 2213 11/23/13 0452  BP: 101/57 120/57 120/68 115/63  Pulse: 57 61 63 66  Temp: 97.8 F (36.6 C) 98.4 F (36.9 C) 98.8 F (37.1 C) 98.1 F (36.7 C)  TempSrc: Oral Oral Oral Oral  Resp: 18 18 18 17   Height:      Weight:      SpO2: 99% 99% 100% 100%     Intake/Output Summary (Last 24 hours) at 11/23/13 0813 Last data filed at 11/23/13 0254  Gross per 24 hour  Intake    480 ml  Output    300 ml  Net    180 ml    LABS: Basic Metabolic Panel:  Recent Labs  11/22/13 0355 11/23/13 0501  NA 132* 133*  K 3.0* 4.0  CL 91* 97  CO2 26 24  GLUCOSE 94 96  BUN 12 13  CREATININE 0.82 0.79  CALCIUM 8.3* 8.1*   Liver Function Tests: No results found for this basename: AST, ALT, ALKPHOS, BILITOT, PROT, ALBUMIN,  in the last 72 hours No results found for this basename: LIPASE, AMYLASE,  in the last 72 hours CBC:  Recent Labs  11/21/13 0137 11/22/13 0355 11/23/13 0501  WBC 10.8* 11.5* 9.1  NEUTROABS 8.3*  --   --   HGB 14.6 12.1* 11.9*  HCT 39.5 33.6* 33.5*  MCV 91.0 92.8 93.8  PLT 207 148* 149*   Cardiac Enzymes:  Recent Labs  11/21/13 1845 11/22/13 0045 11/22/13 0727  TROPONINI 1.26* 1.47* 0.95*   BNP: No components found with this basename: POCBNP,  D-Dimer:  Recent Labs  11/21/13 0239  DDIMER 0.95*   Hemoglobin A1C: No results found for this basename: HGBA1C,  in the last 72 hours Fasting Lipid Panel:  Recent Labs  11/22/13 0355  CHOL 132  HDL 77  LDLCALC 44  TRIG 55  CHOLHDL 1.7   Thyroid Function Tests: No results found for this basename: TSH, T4TOTAL, FREET3,  T3FREE, THYROIDAB,  in the last 72 hours  RADIOLOGY: Dg Chest 2 View  11/21/2013   CLINICAL DATA:  Chest pain, shortness of breath and cough.  EXAM: CHEST  2 VIEW  COMPARISON:  DG CHEST 2 VIEW dated 06/27/2013  FINDINGS: Cardiac silhouette is unremarkable and unchanged. Mildly calcified aortic knob come in mediastinal silhouette is nonsuspicious. Similar mild increase interstitial markings with increased lung volumes, flattening of the hemidiaphragm. Subcentimeter left lung base granuloma with left lung base pleural thickening and scarring. Trachea projects midline and there is no pneumothorax.  Severe degenerative change the left shoulder. Multiple EKG lines overlie the patient and may obscure subtle underlying pathology.  IMPRESSION: COPD with chronic changes of left lung base, no superimposed acute cardiopulmonary process.   Electronically Signed   By: Elon Alas   On: 11/21/2013 02:06   Ct Angio Chest Pe W/cm &/or Wo Cm  11/21/2013   CLINICAL DATA:  Bronchitis and shortness of breath. Rule out pulmonary embolism.  EXAM: CT ANGIOGRAPHY CHEST WITH CONTRAST  TECHNIQUE: Multidetector CT imaging of the chest was performed using the standard protocol during bolus administration of intravenous contrast. Multiplanar  CT image reconstructions and MIPs were obtained to evaluate the vascular anatomy.  CONTRAST:  120mL OMNIPAQUE IOHEXOL 350 MG/ML SOLN  COMPARISON:  10/04/2010  FINDINGS: THORACIC INLET/BODY WALL:  No acute abnormality.  MEDIASTINUM:  Normal heart size. No pericardial effusion. No acute vascular abnormality, including pulmonary embolism. Diffuse atherosclerosis, including the coronary arteries. Calcified lymph nodes in the left hilum. No adenopathy.  LUNG WINDOWS:  Background COPD with both bronchial wall thickening and centrilobular emphysema. There is fluid levels within the bilateral mainstem bronchi. Interlobular septal thickening in the lower lungs were there is also ground-glass opacity.  There are similar, smaller opacities in the right middle lobe and apical segment left lower lobe. Calcified granuloma at the left base.  UPPER ABDOMEN:  Cystic changes in the upper left kidney.  OSSEOUS:  There are numerous sclerotic lesions within the imaged spine and ribs, some of which have developed since comparison CT imaging in 2012. The largest are in the T9 (which is remote) and T10 (which is new) vertebral body. There is also abnormal sclerosis within the right T8 rib head and the left posterior ninth rib.  Review of the MIP images confirms the above findings.  IMPRESSION: 1. Negative for pulmonary embolism. 2. Bibasilar lung opacities is likely a combination of pneumonia and atelectasis. 3. COPD with bronchitis and emphysema. 4. Spine and rib sclerotic lesions, consistent with metastatic spread of the patient's prostate cancer. Given the recently negative bone scan, these may be treated/sterile deposits. Correlate with PSA trend.   Electronically Signed   By: Jorje Guild M.D.   On: 11/21/2013 06:57    PHYSICAL EXAM he is oriented to person time and place. Affect is normal. Lungs reveal scattered rhonchi. Cardiac exam reveals S1 and S2. There is no peripheral edema.   TELEMETRY:     I reviewed telemetry today November 23, 2013. There is normal sinus rhythm with PVCs.   ASSESSMENT AND PLAN:    NSTEMI (non-ST elevated myocardial infarction)      Nuclear study is arranged for today for risk stratification. He had troponin elevation. Up to this point I continue to feel that this is a type II non-STEMI related to stress from his acute shortness of breath. His study will be reviewed later today or in the morning. He has not ready to be discharged home yet.    CAP (community acquired pneumonia)      This is being treated.  [name] 11/23/2013 8:13 AM

## 2013-11-23 NOTE — Progress Notes (Signed)
Pt ambulated in hallway without o2 sat decreased to 89%. Applied 1lnc pt sats increased to 97%. Pt ambulated 372ft with rolling walker. No distress noted. Pt now on o2 at [place], o2 weaning will continue.

## 2013-11-24 ENCOUNTER — Emergency Department (HOSPITAL_COMMUNITY): Payer: Medicare Other

## 2013-11-24 ENCOUNTER — Inpatient Hospital Stay (HOSPITAL_COMMUNITY)
Admission: EM | Admit: 2013-11-24 | Discharge: 2013-11-29 | DRG: 917 | Disposition: A | Discharge status: Skilled Nursing Facility | Payer: Medicare Other | Attending: Internal Medicine | Admitting: Internal Medicine

## 2013-11-24 ENCOUNTER — Encounter (HOSPITAL_COMMUNITY): Payer: Self-pay | Admitting: Emergency Medicine

## 2013-11-24 ENCOUNTER — Other Ambulatory Visit: Payer: Self-pay

## 2013-11-24 DIAGNOSIS — E785 Hyperlipidemia, unspecified: Secondary | ICD-10-CM

## 2013-11-24 DIAGNOSIS — J449 Chronic obstructive pulmonary disease, unspecified: Secondary | ICD-10-CM | POA: Diagnosis present

## 2013-11-24 DIAGNOSIS — Z87891 Personal history of nicotine dependence: Secondary | ICD-10-CM

## 2013-11-24 DIAGNOSIS — R0902 Hypoxemia: Secondary | ICD-10-CM

## 2013-11-24 DIAGNOSIS — Z96659 Presence of unspecified artificial knee joint: Secondary | ICD-10-CM

## 2013-11-24 DIAGNOSIS — E871 Hypo-osmolality and hyponatremia: Secondary | ICD-10-CM

## 2013-11-24 DIAGNOSIS — X001XXA Exposure to smoke in uncontrolled fire in building or structure, initial encounter: Secondary | ICD-10-CM | POA: Diagnosis present

## 2013-11-24 DIAGNOSIS — J069 Acute upper respiratory infection, unspecified: Secondary | ICD-10-CM

## 2013-11-24 DIAGNOSIS — Y9210 Unspecified residential institution as the place of occurrence of the external cause: Secondary | ICD-10-CM | POA: Diagnosis present

## 2013-11-24 DIAGNOSIS — R5383 Other fatigue: Secondary | ICD-10-CM

## 2013-11-24 DIAGNOSIS — R5381 Other malaise: Secondary | ICD-10-CM | POA: Diagnosis present

## 2013-11-24 DIAGNOSIS — I10 Essential (primary) hypertension: Secondary | ICD-10-CM

## 2013-11-24 DIAGNOSIS — IMO0002 Reserved for concepts with insufficient information to code with codable children: Secondary | ICD-10-CM | POA: Diagnosis present

## 2013-11-24 DIAGNOSIS — Z8249 Family history of ischemic heart disease and other diseases of the circulatory system: Secondary | ICD-10-CM

## 2013-11-24 DIAGNOSIS — J189 Pneumonia, unspecified organism: Secondary | ICD-10-CM

## 2013-11-24 DIAGNOSIS — C61 Malignant neoplasm of prostate: Secondary | ICD-10-CM | POA: Diagnosis present

## 2013-11-24 DIAGNOSIS — E876 Hypokalemia: Secondary | ICD-10-CM | POA: Diagnosis present

## 2013-11-24 DIAGNOSIS — M1990 Unspecified osteoarthritis, unspecified site: Secondary | ICD-10-CM | POA: Diagnosis present

## 2013-11-24 DIAGNOSIS — Z7729 Contact with and (suspected ) exposure to other hazardous substances: Secondary | ICD-10-CM

## 2013-11-24 DIAGNOSIS — I214 Non-ST elevation (NSTEMI) myocardial infarction: Secondary | ICD-10-CM | POA: Diagnosis present

## 2013-11-24 DIAGNOSIS — I4891 Unspecified atrial fibrillation: Secondary | ICD-10-CM | POA: Diagnosis present

## 2013-11-24 DIAGNOSIS — Z880 Allergy status to penicillin: Secondary | ICD-10-CM

## 2013-11-24 DIAGNOSIS — J4489 Other specified chronic obstructive pulmonary disease: Secondary | ICD-10-CM | POA: Diagnosis present

## 2013-11-24 DIAGNOSIS — T5891XA Toxic effect of carbon monoxide from unspecified source, accidental (unintentional), initial encounter: Secondary | ICD-10-CM | POA: Diagnosis present

## 2013-11-24 DIAGNOSIS — Z7982 Long term (current) use of aspirin: Secondary | ICD-10-CM

## 2013-11-24 DIAGNOSIS — T5894XA Toxic effect of carbon monoxide from unspecified source, undetermined, initial encounter: Principal | ICD-10-CM | POA: Diagnosis present

## 2013-11-24 DIAGNOSIS — M1610 Unilateral primary osteoarthritis, unspecified hip: Secondary | ICD-10-CM | POA: Diagnosis present

## 2013-11-24 DIAGNOSIS — J705 Respiratory conditions due to smoke inhalation: Secondary | ICD-10-CM | POA: Diagnosis present

## 2013-11-24 DIAGNOSIS — A6000 Herpesviral infection of urogenital system, unspecified: Secondary | ICD-10-CM | POA: Diagnosis present

## 2013-11-24 HISTORY — DX: Hypoxemia: R09.02

## 2013-11-24 HISTORY — DX: Pneumonia, unspecified organism: J18.9

## 2013-11-24 HISTORY — DX: Hypo-osmolality and hyponatremia: E87.1

## 2013-11-24 HISTORY — DX: Contact with and (suspected) exposure to other hazardous substances: Z77.29

## 2013-11-24 LAB — HEPARIN LEVEL (UNFRACTIONATED): HEPARIN UNFRACTIONATED: 0.23 [IU]/mL — AB (ref 0.30–0.70)

## 2013-11-24 LAB — CBC WITH DIFFERENTIAL/PLATELET
BASOS ABS: 0 10*3/uL (ref 0.0–0.1)
Basophils Relative: 0 % (ref 0–1)
Eosinophils Absolute: 0.1 10*3/uL (ref 0.0–0.7)
Eosinophils Relative: 1 % (ref 0–5)
HEMATOCRIT: 38.4 % — AB (ref 39.0–52.0)
HEMOGLOBIN: 13.8 g/dL (ref 13.0–17.0)
LYMPHS ABS: 2.4 10*3/uL (ref 0.7–4.0)
LYMPHS PCT: 23 % (ref 12–46)
MCH: 33.3 pg (ref 26.0–34.0)
MCHC: 35.9 g/dL (ref 30.0–36.0)
MCV: 92.8 fL (ref 78.0–100.0)
MONO ABS: 1.7 10*3/uL — AB (ref 0.1–1.0)
Monocytes Relative: 16 % — ABNORMAL HIGH (ref 3–12)
NEUTROS ABS: 6.2 10*3/uL (ref 1.7–7.7)
Neutrophils Relative %: 60 % (ref 43–77)
Platelets: 185 10*3/uL (ref 150–400)
RBC: 4.14 MIL/uL — AB (ref 4.22–5.81)
RDW: 13.3 % (ref 11.5–15.5)
WBC: 10.4 10*3/uL (ref 4.0–10.5)

## 2013-11-24 LAB — CARBOXYHEMOGLOBIN
Carboxyhemoglobin: 1.6 % — ABNORMAL HIGH (ref 0.5–1.5)
Methemoglobin: 1.6 % — ABNORMAL HIGH (ref 0.0–1.5)
O2 Saturation: 65.3 %
TOTAL HEMOGLOBIN: 14.1 g/dL (ref 13.5–18.0)

## 2013-11-24 MED ORDER — ALBUTEROL SULFATE (2.5 MG/3ML) 0.083% IN NEBU
5.0000 mg | INHALATION_SOLUTION | Freq: Once | RESPIRATORY_TRACT | Status: AC
  Administered 2013-11-24: 5 mg via RESPIRATORY_TRACT
  Filled 2013-11-24: qty 6

## 2013-11-24 MED ORDER — LEVOFLOXACIN 750 MG PO TABS
750.0000 mg | ORAL_TABLET | Freq: Every day | ORAL | Status: DC
Start: 2013-11-24 — End: 2013-11-24
  Administered 2013-11-24: 750 mg via ORAL
  Filled 2013-11-24: qty 1

## 2013-11-24 MED ORDER — ASPIRIN 81 MG PO TBEC: 81.0000 mg | DELAYED_RELEASE_TABLET | Freq: Every day | ORAL | Status: DC

## 2013-11-24 MED ORDER — UNABLE TO FIND: Status: DC

## 2013-11-24 MED ORDER — LEVOFLOXACIN 750 MG PO TABS: 750.0000 mg | ORAL_TABLET | Freq: Every day | ORAL | Status: DC

## 2013-11-24 NOTE — Progress Notes (Signed)
CSW received a call from [facility] Admissions Coordinator, stating that patient's family want SNF for patient. CSW explained to Admissions that PT and MD do not see a skillable need at this time and recommended HH. CSW spoke to MD and MD continues to agree with [facility] recommendation. CSW notified [facility]. CM spoke with family and they are agreeable to [facility]. CSW signing off at this time.  Jeanette Caprice, MSW, [place]

## 2013-11-24 NOTE — Discharge Summary (Signed)
Physician Discharge Summary  Craig [name] UVO:[id number] DOB: [birth year] DOA: 11/20/2013  PCP: Myriam Jacobson, MD  Admit date: 11/20/2013 Discharge date: 11/24/2013  Recommendations for Outpatient Follow-up:  1. Pt will need to follow up with PCP in 2 weeks post discharge 2. Please obtain BMP to evaluate electrolytes and kidney function 3. Please also check CBC to evaluate Hg and Hct levels   Discharge Diagnoses:  Principal Problem:   NSTEMI (non-ST elevated myocardial infarction) Active Problems:   HTN (hypertension)   CAP (community acquired pneumonia)   Hyperlipidemia  Non-ST elevated MI - type II likely,  Cardiology consulted, appreciate input.  -Continue aspirin 81mg  daily -d/c plavix -LexiScan was negative for inducible ischemia, EF 57% -Discontinue heparin drip -Appreciate cardiology followup--followup office 2 months -11/22/2013 echocardiogram EF 60-65% Community-acquired pneumonia -Continue Levaquin given penicillin allergy.  -3 more days of levofloxacin which will complete 7 days of therapy Hyperlipidemia - Check fasting lipid profile. -LDL 44 -Continue atorvastatin  Hypertension  -Currently well-controlled off of anti-HTN meds -Patient has remained off of lisinopril, amlodipine, HCTZ during the hospitalization -Patient was instructed to remain off of the above antihypertensive regimen. He needs to follow up with his primary care physician for further instructions and titration of his antihypertensive regimen  deconditioning -Patient was evaluated by PT -They felt that [facility] Discharge Condition: Stable  Disposition:  home  Diet:heart healthy Wt Readings from Last 3 Encounters:  11/21/13 72.4 kg (159 lb 9.8 oz)  09/29/13 74.844 kg (165 lb)  09/18/13 74.39 kg (164 lb)    History of present illness:  80 year old man with past medical history significant for hypertension, hyperlipidemia, COPD who  presents to the hospital today with the above-mentioned complaints. He states that everything began yesterday at about 9 PM. He lives at [facility] and went to visit a friend down the hallway. On his way back to his room he became very short of breath. He subsequently developed a cough, nonproductive. He couldn't lie down flat. He became concerned enough to come to the hospital to seek evaluation. He denies any chest pain. In the hospital he was found to have what appears to be a pneumonia on CT chest as well as a non-ST elevated MI with troponins that have risen from 0.48-1.38. EKG does not show any signs for acute ischemia. EDP has contacted cardiology, Dr. Marlou Porch, who has recommended transfer to [facility] in case a cardiac catheterization is required. We have been asked to admit him for further evaluation and management. The patient was subsequent transferred to [facility]. The patient was started on a heparin drip. LexiScan stress test was undertaken. It was negative for inducible ischemia. Operative was discontinued. The patient was continued on aspirin.Pt will follow up with cardiology in 2 months.     Consultants: cardiology  Discharge Exam: Filed Vitals:   11/24/13 0648  BP: 127/70  Pulse: 61  Temp: 98.1 F (36.7 C)  Resp: 19   Filed Vitals:   11/23/13 1011 11/23/13 1500 11/23/13 2049 11/24/13 0648  BP: 97/63 106/67 137/79 127/70  Pulse: 71 64 67 61  Temp:  97.7 F (36.5 C) 99.9 F (37.7 C) 98.1 F (36.7 C)  TempSrc:  Oral Oral Oral  Resp:   18 19  Height:      Weight:      SpO2:  99% 99% 95%   General: A&O x 3, NAD, pleasant, cooperative Cardiovascular: RRR, no rub, no gallop,  no S3 Respiratory: CTAB, no wheeze, no rhonchi Abdomen:soft, nontender, nondistended, positive bowel sounds Extremities: trace LE edema, No lymphangitis, no petechiae  Discharge Instructions      Discharge Orders   Future Orders Complete By Expires   Diet - low sodium heart healthy   As directed    Increase activity slowly  As directed        Medication List    STOP taking these medications       amLODipine 5 MG tablet  Commonly known as:  NORVASC     clopidogrel 75 MG tablet  Commonly known as:  PLAVIX     hydrochlorothiazide 50 MG tablet  Commonly known as:  HYDRODIURIL     lisinopril 40 MG tablet  Commonly known as:  PRINIVIL,ZESTRIL     naproxen sodium 220 MG tablet  Commonly known as:  ANAPROX      TAKE these medications       acyclovir 200 MG capsule  Commonly known as:  ZOVIRAX  Take 200 mg by mouth daily.     aspirin 81 MG EC tablet  Take 1 tablet (81 mg total) by mouth daily.     atorvastatin 10 MG tablet  Commonly known as:  LIPITOR  Take 10 mg by mouth every Monday, Wednesday, and Friday.     bicalutamide 50 MG tablet  Commonly known as:  CASODEX  Take 50 mg by mouth daily.     levofloxacin 750 MG tablet  Commonly known as:  LEVAQUIN  Take 1 tablet (750 mg total) by mouth daily. Start 11/25/13     LORazepam 0.5 MG tablet  Commonly known as:  ATIVAN  Take 0.5 mg by mouth at bedtime as needed for sleep.         The results of significant diagnostics from this hospitalization (including imaging, microbiology, ancillary and laboratory) are listed below for reference.    Significant Diagnostic Studies: Dg Chest 2 View  11/21/2013   CLINICAL DATA:  Chest pain, shortness of breath and cough.  EXAM: CHEST  2 VIEW  COMPARISON:  DG CHEST 2 VIEW dated 06/27/2013  FINDINGS: Cardiac silhouette is unremarkable and unchanged. Mildly calcified aortic knob come in mediastinal silhouette is nonsuspicious. Similar mild increase interstitial markings with increased lung volumes, flattening of the hemidiaphragm. Subcentimeter left lung base granuloma with left lung base pleural thickening and scarring. Trachea projects midline and there is no pneumothorax.  Severe degenerative change the left shoulder. Multiple EKG lines overlie the patient and may  obscure subtle underlying pathology.  IMPRESSION: COPD with chronic changes of left lung base, no superimposed acute cardiopulmonary process.   Electronically Signed   By: Elon Alas   On: 11/21/2013 02:06   Ct Angio Chest Pe W/cm &/or Wo Cm  11/21/2013   CLINICAL DATA:  Bronchitis and shortness of breath. Rule out pulmonary embolism.  EXAM: CT ANGIOGRAPHY CHEST WITH CONTRAST  TECHNIQUE: Multidetector CT imaging of the chest was performed using the standard protocol during bolus administration of intravenous contrast. Multiplanar CT image reconstructions and MIPs were obtained to evaluate the vascular anatomy.  CONTRAST:  162mL OMNIPAQUE IOHEXOL 350 MG/ML SOLN  COMPARISON:  10/04/2010  FINDINGS: THORACIC INLET/BODY WALL:  No acute abnormality.  MEDIASTINUM:  Normal heart size. No pericardial effusion. No acute vascular abnormality, including pulmonary embolism. Diffuse atherosclerosis, including the coronary arteries. Calcified lymph nodes in the left hilum. No adenopathy.  LUNG WINDOWS:  Background COPD with both bronchial wall thickening and centrilobular emphysema. There is  fluid levels within the bilateral mainstem bronchi. Interlobular septal thickening in the lower lungs were there is also ground-glass opacity. There are similar, smaller opacities in the right middle lobe and apical segment left lower lobe. Calcified granuloma at the left base.  UPPER ABDOMEN:  Cystic changes in the upper left kidney.  OSSEOUS:  There are numerous sclerotic lesions within the imaged spine and ribs, some of which have developed since comparison CT imaging in 2012. The largest are in the T9 (which is remote) and T10 (which is new) vertebral body. There is also abnormal sclerosis within the right T8 rib head and the left posterior ninth rib.  Review of the MIP images confirms the above findings.  IMPRESSION: 1. Negative for pulmonary embolism. 2. Bibasilar lung opacities is likely a combination of pneumonia and  atelectasis. 3. COPD with bronchitis and emphysema. 4. Spine and rib sclerotic lesions, consistent with metastatic spread of the patient's prostate cancer. Given the recently negative bone scan, these may be treated/sterile deposits. Correlate with PSA trend.   Electronically Signed   By: Jorje Guild M.D.   On: 11/21/2013 06:57   Nm Myocar Multi W/spect W/wall Motion / Ef  11/23/2013   CLINICAL DATA:  80 year old with history of chest pain.  EXAM: MYOCARDIAL IMAGING WITH SPECT (REST AND PHARMACOLOGIC-STRESS)  GATED LEFT VENTRICULAR WALL MOTION STUDY  LEFT VENTRICULAR EJECTION FRACTION  TECHNIQUE: Standard myocardial SPECT imaging was performed after resting intravenous injection of 10 mCi Tc-33m sestamibi. Subsequently, intravenous infusion of Lexiscan was performed under the supervision of the Cardiology staff. At peak effect of the drug, 30 mCi Tc-73m sestamibi was injected intravenously and standard myocardial SPECT imaging was performed. Quantitative gated imaging was also performed to evaluate left ventricular wall motion, and estimate left ventricular ejection fraction.  COMPARISON:  None.  FINDINGS: Decreased uptake along the inferior wall on both the rest and stress images. This finding could be related to diaphragmatic attenuation. There may be gut uptake on the stress images. There is no evidence for a reversible defect. The left ventricle wall motion is within normal limits, including the inferior wall. Calculated ejection fraction is 57%. End-diastolic volume is 81 ml and end systolic volume is 35 ml.  IMPRESSION: No evidence for pharmacological induced ischemia.  Calculated ejection fraction is 57%.  Probable diaphragmatic attenuation.   Electronically Signed   By: Markus Daft M.D.   On: 11/23/2013 15:10     Microbiology: Recent Results (from the past 240 hour(s))  CULTURE, BLOOD (ROUTINE X 2)     Status: None   Collection Time    11/21/13  1:50 PM      Result Value Ref Range Status    Specimen Description BLOOD LEFT ARM   Final   Special Requests BOTTLES DRAWN AEROBIC AND ANAEROBIC 10CC   Final   Culture  Setup Time     Final   Value: 11/21/2013 21:20     Performed at [facility]   Culture     Final   Value:        BLOOD CULTURE RECEIVED NO GROWTH TO DATE CULTURE WILL BE HELD FOR 5 DAYS BEFORE ISSUING A FINAL NEGATIVE REPORT     Performed at [facility]   Report Status PENDING   Incomplete  CULTURE, BLOOD (ROUTINE X 2)     Status: None   Collection Time    11/21/13  1:58 PM      Result Value Ref Range Status   Specimen Description  BLOOD LEFT HAND   Final   Special Requests BOTTLES DRAWN AEROBIC AND ANAEROBIC 10CC   Final   Culture  Setup Time     Final   Value: 11/21/2013 21:20     Performed at [facility]   Culture     Final   Value:        BLOOD CULTURE RECEIVED NO GROWTH TO DATE CULTURE WILL BE HELD FOR 5 DAYS BEFORE ISSUING A FINAL NEGATIVE REPORT     Performed at [facility]   Report Status PENDING   Incomplete     Labs: Basic Metabolic Panel:  Recent Labs Lab 11/21/13 0137 11/22/13 0355 11/23/13 0501  NA 128* 132* 133*  K 4.6 3.0* 4.0  CL 88* 91* 97  CO2 25 26 24   GLUCOSE 102* 94 96  BUN 16 12 13   CREATININE 0.75 0.82 0.79  CALCIUM 9.6 8.3* 8.1*   Liver Function Tests: No results found for this basename: AST, ALT, ALKPHOS, BILITOT, PROT, ALBUMIN,  in the last 168 hours No results found for this basename: LIPASE, AMYLASE,  in the last 168 hours No results found for this basename: AMMONIA,  in the last 168 hours CBC:  Recent Labs Lab 11/21/13 0137 11/22/13 0355 11/23/13 0501  WBC 10.8* 11.5* 9.1  NEUTROABS 8.3*  --   --   HGB 14.6 12.1* 11.9*  HCT 39.5 33.6* 33.5*  MCV 91.0 92.8 93.8  PLT 207 148* 149*   Cardiac Enzymes:  Recent Labs Lab 11/21/13 0137 11/21/13 0511 11/21/13 1845 11/22/13 0045 11/22/13 0727  TROPONINI 0.48* 1.38* 1.26* 1.47* 0.95*   BNP: No components found with this  basename: POCBNP,  CBG: No results found for this basename: GLUCAP,  in the last 168 hours  Time coordinating discharge:  Greater than 30 minutes  Signed:  Mala Gibbard, DO [facility] Pager: [phone] 11/24/2013, 10:53 AM

## 2013-11-24 NOTE — Evaluation (Signed)
Clinical/Bedside Swallow Evaluation Patient Details  Name: Craig [name] MRN: [id number] Date of Birth: [birth year]  Today's Date: 11/24/2013 Time: 1400-1435 SLP Time Calculation (min): 35 min  Past Medical History:  Past Medical History  Diagnosis Date  . Hypertension   . Degenerative disc disease   . Degenerative joint disease   . Genital herpes   . Diverticulitis   . Prostate cancer   . Lumbosacral spondylolysis   . Hip arthritis 09/29/2013    Bilateral   Past Surgical History:  Past Surgical History  Procedure Laterality Date  . Total knee arthroplasty Right 06/21/2013    Procedure: RIGHT TOTAL KNEE ARTHROPLASTY;  Surgeon: Gearlean Alf, MD;  Location: [facility];  Service: Orthopedics;  Laterality: Right;  . Mastoidectomy Bilateral   . Cataract extraction Bilateral   . Tonsillectomy     HPI:  80 year old man with past medical history significant for hypertension, hyperlipidemia, COPD who presents with dyspnea on exertion, shortness of breath, cough .  In the hospital he was found to have what appears to be a pneumonia on CT chest as well as a non-ST elevated MI with troponins that have risen from 0.48-1.38. EKG does not show any signs for acute ischemia.  CT angio revealed Bibasilar lung opacities is likely a combination of pneumonia and atelectasis,  COPD with bronchitis and emphysema.  Swallow consult derived after daughter stated to cardiologist that pt. may be aspirating.   Assessment / Plan / Recommendation Clinical Impression  Pt. presents with baseline cough and reports of coughing on secretions intermittently per family.  No overt indications of aspiration.  Delayed cough that may have been due to baseline (COPD) cough.  Pt. is at an increased risk due to COPD and incoordination of airway protection.  SLP recommends he continue regular texture diet and thin liquids and provided education to pt./friend/daughter regarding relationship of dysphagia and COPD and strategies to  decrease risk.  Educated pt/family on outpatient MBS if symptoms worsen.     Aspiration Risk  Moderate    Diet Recommendation Regular;Thin liquid   Liquid Administration via: Cup;Straw Medication Administration: Whole meds with liquid Supervision: Patient able to self feed Compensations: Slow rate;Small sips/bites Postural Changes and/or Swallow Maneuvers: Seated upright 90 degrees;Upright 30-60 min after meal    Other  Recommendations Oral Care Recommendations: Oral care BID   Follow Up Recommendations  None    Frequency and Duration        Pertinent Vitals/Pain WDL      Swallow Study          Oral/Motor/Sensory Function Overall Oral Motor/Sensory Function: Appears within functional limits for tasks assessed   Ice Chips Ice chips: Not tested   Thin Liquid Thin Liquid: Within functional limits    Nectar Thick Nectar Thick Liquid: Not tested   Honey Thick Honey Thick Liquid: Not tested   Puree Puree: Within functional limits   Solid   GO    Solid: Within functional limits       Houston Siren M.Ed [employer] [phone]  11/24/2013

## 2013-11-24 NOTE — Progress Notes (Signed)
ANTICOAGULATION CONSULT NOTE - Follow Up Consult  Pharmacy Consult for Heparin  Indication: chest pain/ACS  Allergies  Allergen Reactions  . Penicillins Other (See Comments)    unknown    Patient Measurements: Height: 5\' 10"  (177.8 cm) Weight: 159 lb 9.8 oz (72.4 kg) IBW/kg (Calculated) : 73  Vital Signs: Temp: 99.9 F (37.7 C) (03/31 2049) Temp src: Oral (03/31 2049) BP: 137/79 mmHg (03/31 2049) Pulse Rate: 67 (03/31 2049)  Labs:  Recent Labs  11/21/13 0815  11/21/13 1845 11/22/13 0045 11/22/13 0355 11/22/13 0727  11/22/13 2100 11/23/13 0501 11/24/13 0450  HGB  --   --   --   --  12.1*  --   --   --  11.9*  --   HCT  --   --   --   --  33.6*  --   --   --  33.5*  --   PLT  --   --   --   --  148*  --   --   --  149*  --   LABPROT 14.4  --   --   --   --   --   --   --  14.9  --   INR 1.14  --   --   --   --   --   --   --  1.20  --   HEPARINUNFRC  --   < > <0.10*  --  0.32  --   < > 0.53 0.38 0.23*  CREATININE  --   --   --   --  0.82  --   --   --  0.79  --   TROPONINI  --   --  1.26* 1.47*  --  0.95*  --   --   --   --   < > = values in this interval not displayed.  Estimated Creatinine Clearance: 69.1 ml/min (by C-G formula based on Cr of 0.79).   Medications:  Heparin 1300 units/hr  Assessment: 80 y/o M on heparin for NSTEMI. HL is low at 0.23 this AM. Other labs as above.   Goal of Therapy:  Heparin level 0.3-0.7 units/ml Monitor platelets by anticoagulation protocol: Yes   Plan:  -Increase heparin drip to 1450 units/hr -1400 HL -Daily CBC/HL -Monitor for bleeding  Narda Bonds 11/24/2013,5:59 AM
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
## 2013-11-24 NOTE — ED Notes (Signed)
Bed: SE39 Expected date:  Expected time:  Means of arrival:  Comments: EMS 80yo shortness of breath / PNA

## 2013-11-24 NOTE — ED Notes (Signed)
Pt denies SOB while laying in bed on 2L , pt able to talk w/o shortness of breath.

## 2013-11-24 NOTE — Evaluation (Signed)
Physical Therapy Evaluation Patient Details Name: Craig [name] MRN: [id number] DOB: 07/01/1926 Today's Date: 11/24/2013   History of Present Illness  Patient is a very pleasant 80 year old man with past medical history significant for hypertension, hyperlipidemia, COPD who presents to the hospital today with the above-mentioned complaints. He states that everything began yesterday at about 9 PM. He lives at  [facility] and went to visit a friend down the hallway. On his way back to his room he became very short of breath. He subsequently developed a cough, nonproductive. He couldn't lie down flat. He became concerned enough to come to the hospital to seek evaluation. He  denies any chest pain. In the hospital he was found to have what appears to be a pneumonia on CT chest as well as a non-ST elevated MI with troponins that have risen from 0.48-1.38. EKG does not show any signs for acute ischemia  Clinical Impression  Maintained sats on RA throughout session at 95% and EHR  In 70's and low 80's  He can return to indep/assisted living and go for therapy at [facility] to recondition.     Follow Up Recommendations Home health PT    Equipment Recommendations  None recommended by PT    Recommendations for Other Services       Precautions / Restrictions Precautions Precautions: Fall      Mobility  Bed Mobility                  Transfers Overall transfer level: Modified independent                  Ambulation/Gait Ambulation/Gait assistance: Modified independent (Device/Increase time) Ambulation Distance (Feet): 400 Feet Assistive device:  (pushing iv pole) Gait Pattern/deviations: Step-through pattern        Stairs            Wheelchair Mobility    Modified Rankin (Stroke Patients Only)       Balance Overall balance assessment: No apparent balance deficits (not formally assessed)                                   Pertinent  Vitals/Pain See above.     Home Living Family/patient expects to be discharged to:: Assisted living (or independent whichever)                      Prior Function Level of Independence: Independent with assistive device(s)               Hand Dominance        Extremity/Trunk Assessment   Upper Extremity Assessment: Overall WFL for tasks assessed;Generalized weakness           Lower Extremity Assessment: Overall WFL for tasks assessed;Generalized weakness         Communication   Communication: No difficulties  Cognition   Behavior During Therapy: WFL for tasks assessed/performed Overall Cognitive Status: Within Functional Limits for tasks assessed                      General Comments      Exercises General Exercises - Lower Extremity Ankle Circles/Pumps: AROM;Strengthening;Both;10 reps;20 reps Hip ABduction/ADduction: AROM;Both;Strengthening;15 reps;Standing Hip Flexion/Marching: AROM;Both;10 reps;Standing Toe Raises: AROM;Both;15 reps;Standing Heel Raises: AROM;Both;10 reps;Standing Mini-Sqauts: AROM;10 reps;Standing      Assessment/Plan    PT Assessment Patient needs continued PT services  PT  Diagnosis     PT Problem List Decreased strength;Decreased activity tolerance;Decreased mobility  PT Treatment Interventions     PT Goals (Current goals can be found in the Care Plan section) Acute Rehab PT Goals PT Goal Formulation: No goals set, d/c therapy    Frequency     Barriers to discharge        End of Session   Activity Tolerance: Patient tolerated treatment well Patient left: in chair;with nursing/sitter in room         Time: 5093-2671 PT Time Calculation (min): 48 min   Charges:   PT Evaluation $Initial PT Evaluation Tier I: 1 Procedure PT Treatments $Gait Training: 8-22 mins $Therapeutic Exercise: 8-22 mins   PT G Codes:          Zebadiah Willert, Tessie Fass 11/24/2013, 10:45 AM 11/24/2013  Donnella Sham,  PT [phone]  (pager)

## 2013-11-24 NOTE — ED Notes (Signed)
Pt has been placed on cardiac monitoring.  

## 2013-11-24 NOTE — Progress Notes (Signed)
Patient ID: Craig [name], male   DOB: November 12, 1925, 80 y.o.   MRN: [id number]    SUBJECTIVE: Nuclear study reveals no ischemia. His daughter mentions today that she thinks he might be aspirating at home. We will request [place] swallowing consult.   Filed Vitals:   11/23/13 1011 11/23/13 1500 11/23/13 2049 11/24/13 0648  BP: 97/63 106/67 137/79 127/70  Pulse: 71 64 67 61  Temp:  97.7 F (36.5 C) 99.9 F (37.7 C) 98.1 F (36.7 C)  TempSrc:  Oral Oral Oral  Resp:   18 19  Height:      Weight:      SpO2:  99% 99% 95%    No intake or output data in the 24 hours ending 11/24/13 0838  LABS: Basic Metabolic Panel:  Recent Labs  11/22/13 0355 11/23/13 0501  NA 132* 133*  K 3.0* 4.0  CL 91* 97  CO2 26 24  GLUCOSE 94 96  BUN 12 13  CREATININE 0.82 0.79  CALCIUM 8.3* 8.1*   Liver Function Tests: No results found for this basename: AST, ALT, ALKPHOS, BILITOT, PROT, ALBUMIN,  in the last 72 hours No results found for this basename: LIPASE, AMYLASE,  in the last 72 hours CBC:  Recent Labs  11/22/13 0355 11/23/13 0501  WBC 11.5* 9.1  HGB 12.1* 11.9*  HCT 33.6* 33.5*  MCV 92.8 93.8  PLT 148* 149*   Cardiac Enzymes:  Recent Labs  11/21/13 1845 11/22/13 0045 11/22/13 0727  TROPONINI 1.26* 1.47* 0.95*   BNP: No components found with this basename: POCBNP,  D-Dimer: No results found for this basename: DDIMER,  in the last 72 hours Hemoglobin A1C: No results found for this basename: HGBA1C,  in the last 72 hours Fasting Lipid Panel:  Recent Labs  11/22/13 0355  CHOL 132  HDL 77  LDLCALC 44  TRIG 55  CHOLHDL 1.7   Thyroid Function Tests: No results found for this basename: TSH, T4TOTAL, FREET3, T3FREE, THYROIDAB,  in the last 72 hours  RADIOLOGY: Dg Chest 2 View  11/21/2013   CLINICAL DATA:  Chest pain, shortness of breath and cough.  EXAM: CHEST  2 VIEW  COMPARISON:  DG CHEST 2 VIEW dated 06/27/2013  FINDINGS: Cardiac silhouette is unremarkable and unchanged.  Mildly calcified aortic knob come in mediastinal silhouette is nonsuspicious. Similar mild increase interstitial markings with increased lung volumes, flattening of the hemidiaphragm. Subcentimeter left lung base granuloma with left lung base pleural thickening and scarring. Trachea projects midline and there is no pneumothorax.  Severe degenerative change the left shoulder. Multiple EKG lines overlie the patient and may obscure subtle underlying pathology.  IMPRESSION: COPD with chronic changes of left lung base, no superimposed acute cardiopulmonary process.   Electronically Signed   By: Elon Alas   On: 11/21/2013 02:06   Ct Angio Chest Pe W/cm &/or Wo Cm  11/21/2013   CLINICAL DATA:  Bronchitis and shortness of breath. Rule out pulmonary embolism.  EXAM: CT ANGIOGRAPHY CHEST WITH CONTRAST  TECHNIQUE: Multidetector CT imaging of the chest was performed using the standard protocol during bolus administration of intravenous contrast. Multiplanar CT image reconstructions and MIPs were obtained to evaluate the vascular anatomy.  CONTRAST:  155mL OMNIPAQUE IOHEXOL 350 MG/ML SOLN  COMPARISON:  10/04/2010  FINDINGS: THORACIC INLET/BODY WALL:  No acute abnormality.  MEDIASTINUM:  Normal heart size. No pericardial effusion. No acute vascular abnormality, including pulmonary embolism. Diffuse atherosclerosis, including the coronary arteries. Calcified lymph nodes in the left hilum.  No adenopathy.  LUNG WINDOWS:  Background COPD with both bronchial wall thickening and centrilobular emphysema. There is fluid levels within the bilateral mainstem bronchi. Interlobular septal thickening in the lower lungs were there is also ground-glass opacity. There are similar, smaller opacities in the right middle lobe and apical segment left lower lobe. Calcified granuloma at the left base.  UPPER ABDOMEN:  Cystic changes in the upper left kidney.  OSSEOUS:  There are numerous sclerotic lesions within the imaged spine and ribs,  some of which have developed since comparison CT imaging in 2012. The largest are in the T9 (which is remote) and T10 (which is new) vertebral body. There is also abnormal sclerosis within the right T8 rib head and the left posterior ninth rib.  Review of the MIP images confirms the above findings.  IMPRESSION: 1. Negative for pulmonary embolism. 2. Bibasilar lung opacities is likely a combination of pneumonia and atelectasis. 3. COPD with bronchitis and emphysema. 4. Spine and rib sclerotic lesions, consistent with metastatic spread of the patient's prostate cancer. Given the recently negative bone scan, these may be treated/sterile deposits. Correlate with PSA trend.   Electronically Signed   By: Jorje Guild M.D.   On: 11/21/2013 06:57   Nm Myocar Multi W/spect W/wall Motion / Ef  11/23/2013   CLINICAL DATA:  80 year old with history of chest pain.  EXAM: MYOCARDIAL IMAGING WITH SPECT (REST AND PHARMACOLOGIC-STRESS)  GATED LEFT VENTRICULAR WALL MOTION STUDY  LEFT VENTRICULAR EJECTION FRACTION  TECHNIQUE: Standard myocardial SPECT imaging was performed after resting intravenous injection of 10 mCi Tc-24m sestamibi. Subsequently, intravenous infusion of Lexiscan was performed under the supervision of the Cardiology staff. At peak effect of the drug, 30 mCi Tc-59m sestamibi was injected intravenously and standard myocardial SPECT imaging was performed. Quantitative gated imaging was also performed to evaluate left ventricular wall motion, and estimate left ventricular ejection fraction.  COMPARISON:  None.  FINDINGS: Decreased uptake along the inferior wall on both the rest and stress images. This finding could be related to diaphragmatic attenuation. There may be gut uptake on the stress images. There is no evidence for a reversible defect. The left ventricle wall motion is within normal limits, including the inferior wall. Calculated ejection fraction is 57%. End-diastolic volume is 81 ml and end systolic  volume is 35 ml.  IMPRESSION: No evidence for pharmacological induced ischemia.  Calculated ejection fraction is 57%.  Probable diaphragmatic attenuation.   Electronically Signed   By: Markus Daft M.D.   On: 11/23/2013 15:10    PHYSICAL EXAM   patient is oriented to person time and place. Affect is normal. Lungs reveal scattered rhonchi. Cardiac exam reveals S1 and S2. Abdomen is soft. There is no peripheral edema.   TELEMETRY: I have reviewed telemetry today November 24, 2013. There is normal sinus rhythm.   ASSESSMENT AND PLAN:    NSTEMI (non-ST elevated myocardial infarction)     The nuclear scan reveals no marked ischemia. He is normal left ventricular function. No further cardiac workup is recommended. I feel his non-STEMI was a type II related to his marked initial strain from hypoxia. The patient is stable from the cardiac viewpoint. The decision concerning how long to keep in the hospital will be up to the primary care team. My team will contact him concerning a post hospital cardiology followup in 6-8 weeks.    HTN (hypertension)    CAP (community acquired pneumonia)     This is being treated in the hospital.  He continues to have a cough and feels that at times he cannot completely clear his throat.    Hyperlipidemia   [name] 11/24/2013 8:38 AM

## 2013-11-24 NOTE — Care Management Note (Signed)
    Page 1 of 1   11/24/2013     3:50:55 PM   CARE MANAGEMENT NOTE 11/24/2013  Patient:  Craig [name], Craig [name]   Account Number:  [id number]  Date Initiated:  11/24/2013  Documentation initiated by:  Marlayna Bannister  Subjective/Objective Assessment:   PT ADM ON 3/28 WITH NSTEMI.  PTA, PT LIVES ALONE AT [facility].     Action/Plan:   P.T. RECOMMENDING HHPT AT DC; FACILITY HAS IN-HOUSE PHYSICAL THERAPY THROUGH [facility].  WILL FAX P.T CONSULT AND ORDER FOR OP P.T. TO [facility] AT [phone].   Anticipated DC Date:  11/24/2013   Anticipated DC Plan:  [place]  CM consult      Choice offered to / List presented to:             Status of service:  Completed, signed off Medicare Important Message given?   (If response is "NO", the following Medicare IM given date fields will be blank) Date Medicare IM given:   Date Additional Medicare IM given:    Discharge Disposition:  HOME/SELF CARE  Per UR Regulation:  Reviewed for med. necessity/level of care/duration of stay  If discussed at [place] of Stay Meetings, dates discussed:    Comments:  11/24/13 Ahmarion Saraceno,RN,BSN [phone] MET WITH PT AND FAMILY TO [place]; THEY ARE IN AGREEMENT WITH PLAN TO RETURN TO INDEPENDENT LOC WITH THERAPY AS PROVIDED BY [facility].  WILL ARRANGE.

## 2013-11-25 ENCOUNTER — Encounter (HOSPITAL_COMMUNITY): Payer: Self-pay | Admitting: Internal Medicine

## 2013-11-25 DIAGNOSIS — Z7729 Contact with and (suspected ) exposure to other hazardous substances: Secondary | ICD-10-CM | POA: Diagnosis present

## 2013-11-25 DIAGNOSIS — R0902 Hypoxemia: Secondary | ICD-10-CM | POA: Diagnosis present

## 2013-11-25 DIAGNOSIS — J189 Pneumonia, unspecified organism: Secondary | ICD-10-CM

## 2013-11-25 DIAGNOSIS — E785 Hyperlipidemia, unspecified: Secondary | ICD-10-CM

## 2013-11-25 DIAGNOSIS — T5891XA Toxic effect of carbon monoxide from unspecified source, accidental (unintentional), initial encounter: Secondary | ICD-10-CM | POA: Diagnosis present

## 2013-11-25 LAB — TROPONIN I
Troponin I: <0.3 ng/mL (ref <0.30)
Troponin I: <0.3 ng/mL (ref <0.30)

## 2013-11-25 LAB — CBC
HEMATOCRIT: 33.9 % — AB (ref 39.0–52.0)
Hemoglobin: 12.1 g/dL — ABNORMAL LOW (ref 13.0–17.0)
MCH: 33.2 pg (ref 26.0–34.0)
MCHC: 35.7 g/dL (ref 30.0–36.0)
MCV: 92.9 fL (ref 78.0–100.0)
Platelets: 177 10*3/uL (ref 150–400)
RBC: 3.65 MIL/uL — ABNORMAL LOW (ref 4.22–5.81)
RDW: 13.3 % (ref 11.5–15.5)
WBC: 10 10*3/uL (ref 4.0–10.5)

## 2013-11-25 LAB — CARBOXYHEMOGLOBIN
CARBOXYHEMOGLOBIN: 1.4 % (ref 0.5–1.5)
METHEMOGLOBIN: 1.4 % (ref 0.0–1.5)
O2 Saturation: 59.6 %
TOTAL HEMOGLOBIN: 12.9 g/dL — AB (ref 13.5–18.0)

## 2013-11-25 LAB — COMPREHENSIVE METABOLIC PANEL
ALT: 21 U/L (ref 0–53)
AST: 28 U/L (ref 0–37)
Albumin: 3.9 g/dL (ref 3.5–5.2)
Alkaline Phosphatase: 65 U/L (ref 39–117)
BILIRUBIN TOTAL: 0.6 mg/dL (ref 0.3–1.2)
BUN: 14 mg/dL (ref 6–23)
CHLORIDE: 91 meq/L — AB (ref 96–112)
CO2: 21 meq/L (ref 19–32)
CREATININE: 0.73 mg/dL (ref 0.50–1.35)
Calcium: 9.1 mg/dL (ref 8.4–10.5)
GFR calc non Af Amer: 82 mL/min — ABNORMAL LOW (ref >90)
GLUCOSE: 120 mg/dL — AB (ref 70–99)
Potassium: 4.1 mEq/L (ref 3.7–5.3)
Sodium: 127 mEq/L — ABNORMAL LOW (ref 137–147)
Total Protein: 7.1 g/dL (ref 6.0–8.3)

## 2013-11-25 LAB — BASIC METABOLIC PANEL
BUN: 13 mg/dL (ref 6–23)
CO2: 23 meq/L (ref 19–32)
CREATININE: 0.73 mg/dL (ref 0.50–1.35)
Calcium: 8.4 mg/dL (ref 8.4–10.5)
Chloride: 94 mEq/L — ABNORMAL LOW (ref 96–112)
GFR calc non Af Amer: 82 mL/min — ABNORMAL LOW (ref >90)
Glucose, Bld: 125 mg/dL — ABNORMAL HIGH (ref 70–99)
Potassium: 3.5 mEq/L — ABNORMAL LOW (ref 3.7–5.3)
Sodium: 130 mEq/L — ABNORMAL LOW (ref 137–147)

## 2013-11-25 MED ORDER — ACETAMINOPHEN 650 MG RE SUPP: 650.0000 mg | Freq: Four times a day (QID) | RECTAL | Status: DC | PRN

## 2013-11-25 MED ORDER — ASPIRIN EC 325 MG PO TBEC
325.0000 mg | DELAYED_RELEASE_TABLET | Freq: Every day | ORAL | Status: DC
  Administered 2013-11-25 – 2013-11-29 (×5): 325 mg via ORAL
  Filled 2013-11-25 (×5): qty 1

## 2013-11-25 MED ORDER — ATORVASTATIN CALCIUM 10 MG PO TABS
10.0000 mg | ORAL_TABLET | Freq: Every day | ORAL | Status: DC
  Administered 2013-11-25 – 2013-11-28 (×4): 10 mg via ORAL
  Filled 2013-11-25 (×5): qty 1

## 2013-11-25 MED ORDER — LORAZEPAM 0.5 MG PO TABS
0.5000 mg | ORAL_TABLET | Freq: Every evening | ORAL | Status: DC | PRN
  Administered 2013-11-26 – 2013-11-28 (×4): 0.5 mg via ORAL
  Filled 2013-11-25 (×4): qty 1

## 2013-11-25 MED ORDER — ACETAMINOPHEN 325 MG PO TABS: 650.0000 mg | ORAL_TABLET | Freq: Four times a day (QID) | ORAL | Status: DC | PRN

## 2013-11-25 MED ORDER — ALBUTEROL SULFATE (2.5 MG/3ML) 0.083% IN NEBU
2.5000 mg | INHALATION_SOLUTION | RESPIRATORY_TRACT | Status: DC | PRN
  Administered 2013-11-28: 2.5 mg via RESPIRATORY_TRACT
  Filled 2013-11-25: qty 3

## 2013-11-25 MED ORDER — SODIUM CHLORIDE 0.9 % IJ SOLN
3.0000 mL | Freq: Two times a day (BID) | INTRAMUSCULAR | Status: DC
  Administered 2013-11-25 – 2013-11-28 (×4): 3 mL via INTRAVENOUS

## 2013-11-25 MED ORDER — LEVOFLOXACIN 750 MG PO TABS
750.0000 mg | ORAL_TABLET | Freq: Every day | ORAL | Status: DC
  Administered 2013-11-25: 750 mg via ORAL
  Filled 2013-11-25: qty 1

## 2013-11-25 MED ORDER — ONDANSETRON HCL 4 MG/2ML IJ SOLN: 4.0000 mg | Freq: Four times a day (QID) | INTRAMUSCULAR | Status: DC | PRN

## 2013-11-25 MED ORDER — ACYCLOVIR 200 MG PO CAPS
200.0000 mg | ORAL_CAPSULE | Freq: Every day | ORAL | Status: DC
  Administered 2013-11-25 – 2013-11-29 (×5): 200 mg via ORAL
  Filled 2013-11-25 (×5): qty 1

## 2013-11-25 MED ORDER — SODIUM CHLORIDE 0.9 % IJ SOLN
3.0000 mL | Freq: Two times a day (BID) | INTRAMUSCULAR | Status: DC
  Administered 2013-11-26 – 2013-11-28 (×4): 3 mL via INTRAVENOUS

## 2013-11-25 MED ORDER — ALBUTEROL SULFATE (2.5 MG/3ML) 0.083% IN NEBU: 2.5000 mg | INHALATION_SOLUTION | RESPIRATORY_TRACT | Status: DC | PRN

## 2013-11-25 MED ORDER — ONDANSETRON HCL 4 MG PO TABS: 4.0000 mg | ORAL_TABLET | Freq: Four times a day (QID) | ORAL | Status: DC | PRN

## 2013-11-25 MED ORDER — POTASSIUM CHLORIDE CRYS ER 20 MEQ PO TBCR
40.0000 meq | EXTENDED_RELEASE_TABLET | Freq: Two times a day (BID) | ORAL | Status: AC
  Administered 2013-11-25 (×2): 40 meq via ORAL
  Filled 2013-11-25 (×2): qty 2

## 2013-11-25 MED ORDER — DEXTROSE 5 % IV SOLN
1.0000 g | Freq: Three times a day (TID) | INTRAVENOUS | Status: DC
  Administered 2013-11-25 – 2013-11-29 (×12): 1 g via INTRAVENOUS
  Filled 2013-11-25 (×14): qty 1

## 2013-11-25 MED ORDER — ENOXAPARIN SODIUM 40 MG/0.4ML ~~LOC~~ SOLN
40.0000 mg | SUBCUTANEOUS | Status: DC
  Administered 2013-11-25 – 2013-11-29 (×5): 40 mg via SUBCUTANEOUS
  Filled 2013-11-25 (×5): qty 0.4

## 2013-11-25 MED ORDER — BICALUTAMIDE 50 MG PO TABS
50.0000 mg | ORAL_TABLET | Freq: Every day | ORAL | Status: DC
  Administered 2013-11-25 – 2013-11-29 (×5): 50 mg via ORAL
  Filled 2013-11-25 (×5): qty 1

## 2013-11-25 MED ORDER — HYDRALAZINE HCL 20 MG/ML IJ SOLN
10.0000 mg | INTRAMUSCULAR | Status: DC | PRN
  Filled 2013-11-25: qty 0.5

## 2013-11-25 MED ORDER — VANCOMYCIN HCL IN DEXTROSE 1-5 GM/200ML-% IV SOLN
1000.0000 mg | Freq: Two times a day (BID) | INTRAVENOUS | Status: DC
  Administered 2013-11-25 – 2013-11-27 (×4): 1000 mg via INTRAVENOUS
  Filled 2013-11-25 (×6): qty 200

## 2013-11-25 NOTE — Progress Notes (Signed)
Received report and assuming care of patient, agree with previous assessment charted. Will continue to monitor. J.Khalee Mazo, RN

## 2013-11-25 NOTE — ED Notes (Signed)
Pt's O2 saturation was 90% on Room Air, without ambulating. When the Pt stood up, he felt too weak and was shaking too badly to walk. PA Muthersbaugh is aware.
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
## 2013-11-25 NOTE — Progress Notes (Signed)
ANTIBIOTIC CONSULT NOTE - INITIAL  Pharmacy Consult for Levofloxacin Indication: pneumonia  Allergies  Allergen Reactions  . Penicillins Other (See Comments)    unknown    Patient Measurements:   Adjusted Body Weight:   Vital Signs: Temp: 98.3 F (36.8 C) (04/02 0155) Temp src: Oral (04/02 0155) BP: 145/80 mmHg (04/02 0155) Pulse Rate: 85 (04/02 0155) Intake/Output from previous day:   Intake/Output from this shift:    Labs:  Recent Labs  11/23/13 0501 11/24/13 2330 11/25/13 0320  WBC 9.1 10.4 10.0  HGB 11.9* 13.8 12.1*  PLT 149* 185 177  CREATININE 0.79 0.73 0.73   The CrCl is unknown because both a height and weight (above a minimum accepted value) are required for this calculation. No results found for this basename: VANCOTROUGH, VANCOPEAK, VANCORANDOM, [place], GENTPEAK, GENTRANDOM, TOBRATROUGH, TOBRAPEAK, TOBRARND, AMIKACINPEAK, AMIKACINTROU, AMIKACIN,  in the last 72 hours   Microbiology: Recent Results (from the past 720 hour(s))  CULTURE, BLOOD (ROUTINE X 2)     Status: None   Collection Time    11/21/13  1:50 PM      Result Value Ref Range Status   Specimen Description BLOOD LEFT ARM   Final   Special Requests BOTTLES DRAWN AEROBIC AND ANAEROBIC 10CC   Final   Culture  Setup Time     Final   Value: 11/21/2013 21:20     Performed at [facility]   Culture     Final   Value:        BLOOD CULTURE RECEIVED NO GROWTH TO DATE CULTURE WILL BE HELD FOR 5 DAYS BEFORE ISSUING A FINAL NEGATIVE REPORT     Performed at [facility]   Report Status PENDING   Incomplete  CULTURE, BLOOD (ROUTINE X 2)     Status: None   Collection Time    11/21/13  1:58 PM      Result Value Ref Range Status   Specimen Description BLOOD LEFT HAND   Final   Special Requests BOTTLES DRAWN AEROBIC AND ANAEROBIC 10CC   Final   Culture  Setup Time     Final   Value: 11/21/2013 21:20     Performed at [facility]   Culture     Final   Value:        BLOOD  CULTURE RECEIVED NO GROWTH TO DATE CULTURE WILL BE HELD FOR 5 DAYS BEFORE ISSUING A FINAL NEGATIVE REPORT     Performed at [facility]   Report Status PENDING   Incomplete    Medical History: Past Medical History  Diagnosis Date  . Hypertension   . Degenerative disc disease   . Degenerative joint disease   . Genital herpes   . Diverticulitis   . Prostate cancer   . Lumbosacral spondylolysis   . Hip arthritis 09/29/2013    Bilateral    Medications:  Anti-infectives   Start     Dose/Rate Route Frequency Ordered Stop   11/25/13 1000  acyclovir (ZOVIRAX) 200 MG capsule 200 mg     200 mg Oral Daily 11/25/13 0305     11/25/13 1000  levofloxacin (LEVAQUIN) tablet 750 mg     750 mg Oral Daily 11/25/13 0325       Assessment: Patient with PNA.    Goal of Therapy:  Levofloxacin dosed based on patient weight and renal function   Plan:  Follow up culture results Levofloxacin 750mg  PO Daily  Nani Skillern Crowford 11/25/2013,4:17 AM

## 2013-11-25 NOTE — Progress Notes (Addendum)
Pt discharge with family to [facility]. Discharge instructions reviewed. May pt aware of prescriptions are at pharmacy. Questions answered. Encouraged pt tocall with needs. Speech therapy evaluated pt and stated no needs, pt was not aspirating.

## 2013-11-25 NOTE — Progress Notes (Addendum)
Patient has a bed in the SNF section at [facility] when ready for discharge. Patient has had a 3-night stay from 3/28 - 11/24/13 at [facility].   Clinical Social Work Department CLINICAL SOCIAL WORK PLACEMENT NOTE 11/25/2013  Patient:  Craig [name], Craig [name]  Account Number:  [id number] Admit date:  11/24/2013  Clinical Social Worker:  Renold Genta  Date/time:  11/25/2013 11:14 AM  Clinical Social Work is seeking post-discharge placement for this patient at the following level of care:   SKILLED NURSING   (*CSW will update this form in Epic as items are completed)   11/25/2013  Patient/family provided with [place] Department of Clinical Social Work's list of facilities offering this level of care within the geographic area requested by the patient (or if unable, by the patient's family).  11/25/2013  Patient/family informed of their freedom to choose among providers that offer the needed level of care, that participate in Medicare, Medicaid or managed care program needed by the patient, have an available bed and are willing to accept the patient.  11/25/2013  Patient/family informed of [facility]' ownership interest in [facility], as well as of the fact that they are under no obligation to receive care at this facility.  PASARR submitted to EDS on 11/25/2013 PASARR number received from EDS on 11/25/2013  FL2 transmitted to all facilities in geographic area requested by pt/family on  11/25/2013 FL2 transmitted to all facilities within larger geographic area on   Patient informed that his/her managed care company has contracts with or will negotiate with  certain facilities, including the following:     Patient/family informed of bed offers received:  11/25/2013 Patient chooses bed at [facility] Physician recommends and patient chooses bed at    Patient to be transferred to [facility] on   Patient to be transferred to facility by   The following  physician request were entered in Epic:   Additional Comments:   Raynaldo Opitz, [place] Worker cell #: [phone]

## 2013-11-26 ENCOUNTER — Inpatient Hospital Stay (HOSPITAL_COMMUNITY): Payer: Medicare Other

## 2013-11-26 LAB — BASIC METABOLIC PANEL
BUN: 13 mg/dL (ref 6–23)
CO2: 26 mEq/L (ref 19–32)
CREATININE: 0.77 mg/dL (ref 0.50–1.35)
Calcium: 9.2 mg/dL (ref 8.4–10.5)
Chloride: 95 mEq/L — ABNORMAL LOW (ref 96–112)
GFR calc non Af Amer: 80 mL/min — ABNORMAL LOW (ref >90)
Glucose, Bld: 113 mg/dL — ABNORMAL HIGH (ref 70–99)
Potassium: 4.9 mEq/L (ref 3.7–5.3)
Sodium: 131 mEq/L — ABNORMAL LOW (ref 137–147)

## 2013-11-26 LAB — EXPECTORATED SPUTUM ASSESSMENT W GRAM STAIN, RFLX TO RESP C

## 2013-11-26 LAB — EXPECTORATED SPUTUM ASSESSMENT W REFEX TO RESP CULTURE

## 2013-11-26 MED ORDER — IPRATROPIUM-ALBUTEROL 0.5-2.5 (3) MG/3ML IN SOLN
3.0000 mL | Freq: Once | RESPIRATORY_TRACT | Status: AC
  Administered 2013-11-26: 3 mL via RESPIRATORY_TRACT
  Filled 2013-11-26: qty 3

## 2013-11-26 MED ORDER — POTASSIUM CHLORIDE CRYS ER 20 MEQ PO TBCR: 40.0000 meq | EXTENDED_RELEASE_TABLET | Freq: Two times a day (BID) | ORAL | Status: DC

## 2013-11-26 MED ORDER — GUAIFENESIN-DM 100-10 MG/5ML PO SYRP
5.0000 mL | ORAL_SOLUTION | ORAL | Status: DC | PRN
  Administered 2013-11-26 – 2013-11-28 (×5): 5 mL via ORAL
  Filled 2013-11-26 (×5): qty 10
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
## 2013-11-26 NOTE — Evaluation (Signed)
I have reviewed this note and agree with all findings. Kati Emylie Amster, PT, DPT Pager: [phone]   
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
## 2013-11-27 DIAGNOSIS — J189 Pneumonia, unspecified organism: Secondary | ICD-10-CM | POA: Diagnosis present

## 2013-11-27 DIAGNOSIS — I10 Essential (primary) hypertension: Secondary | ICD-10-CM

## 2013-11-27 LAB — CULTURE, BLOOD (ROUTINE X 2)
CULTURE: NO GROWTH
Culture: NO GROWTH

## 2013-11-27 LAB — VANCOMYCIN, TROUGH: Vancomycin Tr: 10.7 ug/mL (ref 10.0–20.0)

## 2013-11-27 MED ORDER — POLYETHYLENE GLYCOL 3350 17 G PO PACK
17.0000 g | PACK | Freq: Every day | ORAL | Status: DC
  Administered 2013-11-27: 17 g via ORAL
  Filled 2013-11-27 (×3): qty 1

## 2013-11-27 MED ORDER — SENNOSIDES-DOCUSATE SODIUM 8.6-50 MG PO TABS
2.0000 | ORAL_TABLET | Freq: Two times a day (BID) | ORAL | Status: DC
  Administered 2013-11-27 (×2): 2 via ORAL
  Filled 2013-11-27 (×6): qty 2

## 2013-11-27 MED ORDER — SODIUM CHLORIDE 0.9 % IV SOLN
1250.0000 mg | Freq: Two times a day (BID) | INTRAVENOUS | Status: DC
  Administered 2013-11-27 – 2013-11-29 (×5): 1250 mg via INTRAVENOUS
  Filled 2013-11-27 (×6): qty 1250
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
## 2013-11-27 NOTE — ED Provider Notes (Signed)
Medical screening examination/treatment/procedure(s) were conducted as a shared visit with non-physician practitioner(s) and myself.  I personally evaluated the patient during the encounter.   EKG Interpretation   Date/Time:  Wednesday November 24 2013 23:18:04 EDT Ventricular Rate:  102 PR Interval:    QRS Duration: 77 QT Interval:  419 QTC Calculation: 546 R Axis:   89 Text Interpretation:  Atrial fibrillation Paired ventricular premature  complexes Borderline right axis deviation ED PHYSICIAN INTERPRETATION  AVAILABLE IN [facility] Confirmed by TEST, Record ([id number]) on 11/26/2013  7:19:16 AM     Patient with shortness of breath and bronchospasm. Improving with nebs, but still hypoxic. Admit to medicine.  Orpah Greek, MD 11/27/13 [phone]
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
## 2013-11-28 ENCOUNTER — Inpatient Hospital Stay (HOSPITAL_COMMUNITY): Payer: Medicare Other

## 2013-11-28 LAB — CULTURE, RESPIRATORY W GRAM STAIN: Culture: NORMAL

## 2013-11-28 LAB — CULTURE, RESPIRATORY: GRAM STAIN: NONE SEEN

## 2013-11-28 MED ORDER — GUAIFENESIN ER 600 MG PO TB12
600.0000 mg | ORAL_TABLET | Freq: Two times a day (BID) | ORAL | Status: DC
  Administered 2013-11-28 – 2013-11-29 (×2): 600 mg via ORAL
  Filled 2013-11-28 (×3): qty 1

## 2013-11-28 MED ORDER — ALBUTEROL SULFATE (2.5 MG/3ML) 0.083% IN NEBU: 2.5000 mg | INHALATION_SOLUTION | Freq: Four times a day (QID) | RESPIRATORY_TRACT | Status: DC | PRN

## 2013-11-28 MED ORDER — IPRATROPIUM-ALBUTEROL 0.5-2.5 (3) MG/3ML IN SOLN
3.0000 mL | Freq: Three times a day (TID) | RESPIRATORY_TRACT | Status: DC
  Administered 2013-11-29 (×2): 3 mL via RESPIRATORY_TRACT
  Filled 2013-11-28 (×2): qty 3
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
## 2013-11-29 DIAGNOSIS — J069 Acute upper respiratory infection, unspecified: Secondary | ICD-10-CM

## 2013-11-29 LAB — CREATININE, SERUM
Creatinine, Ser: 0.78 mg/dL (ref 0.50–1.35)
GFR calc Af Amer: >90 mL/min (ref >90)
GFR calc non Af Amer: 80 mL/min — ABNORMAL LOW (ref >90)

## 2013-11-29 MED ORDER — LORAZEPAM 0.5 MG PO TABS
0.5000 mg | ORAL_TABLET | Freq: Every evening | ORAL | Status: DC | PRN
Start: 2013-11-29 — End: 2016-09-14

## 2013-11-29 MED ORDER — GUAIFENESIN-DM 100-10 MG/5ML PO SYRP: 5.0000 mL | ORAL_SOLUTION | ORAL | Status: DC | PRN

## 2013-11-29 MED ORDER — LEVOFLOXACIN 750 MG PO TABS: 750.0000 mg | ORAL_TABLET | Freq: Every day | ORAL | Status: DC
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
## 2013-11-29 NOTE — Progress Notes (Signed)
Patient is set to discharge to [facility] today. Patient & daughter at bedside aware. Discharge packet given to RN, Susie. [facility] called for transport.   Clinical Social Work Department CLINICAL SOCIAL WORK PLACEMENT NOTE 11/29/2013  Patient:  Craig [name], Craig [name]  Account Number:  [id number] Admit date:  11/24/2013  Clinical Social Worker:  Renold Genta  Date/time:  11/25/2013 11:14 AM  Clinical Social Work is seeking post-discharge placement for this patient at the following level of care:   SKILLED NURSING   (*CSW will update this form in Epic as items are completed)   11/25/2013  Patient/family provided with [facility] Department of Clinical Social Work's list of facilities offering this level of care within the geographic area requested by the patient (or if unable, by the patient's family).  11/25/2013  Patient/family informed of their freedom to choose among providers that offer the needed level of care, that participate in Medicare, Medicaid or managed care program needed by the patient, have an available bed and are willing to accept the patient.  11/25/2013  Patient/family informed of [facility]' ownership interest in [facility], as well as of the fact that they are under no obligation to receive care at this facility.  PASARR submitted to EDS on 11/25/2013 PASARR number received from EDS on 11/25/2013  FL2 transmitted to all facilities in geographic area requested by pt/family on  11/25/2013 FL2 transmitted to all facilities within larger geographic area on   Patient informed that his/her managed care company has contracts with or will negotiate with  certain facilities, including the following:     Patient/family informed of bed offers received:  11/25/2013 Patient chooses bed at [facility] Physician recommends and patient chooses bed at    Patient to be transferred to [facility] on  11/29/2013 Patient to be transferred to facility  by [facility]  The following physician request were entered in Epic:   Additional Comments:   Raynaldo Opitz, [place] Worker cell #: [phone]
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
## 2013-11-30 ENCOUNTER — Non-Acute Institutional Stay (SKILLED_NURSING_FACILITY): Payer: Medicare Other | Admitting: Nurse Practitioner

## 2013-11-30 ENCOUNTER — Encounter: Payer: Self-pay | Admitting: Nurse Practitioner

## 2013-11-30 DIAGNOSIS — C61 Malignant neoplasm of prostate: Secondary | ICD-10-CM

## 2013-11-30 DIAGNOSIS — D62 Acute posthemorrhagic anemia: Secondary | ICD-10-CM

## 2013-11-30 DIAGNOSIS — J189 Pneumonia, unspecified organism: Secondary | ICD-10-CM

## 2013-11-30 DIAGNOSIS — K219 Gastro-esophageal reflux disease without esophagitis: Secondary | ICD-10-CM

## 2013-11-30 DIAGNOSIS — Z7729 Contact with and (suspected ) exposure to other hazardous substances: Secondary | ICD-10-CM

## 2013-11-30 DIAGNOSIS — I10 Essential (primary) hypertension: Secondary | ICD-10-CM

## 2013-11-30 DIAGNOSIS — E871 Hypo-osmolality and hyponatremia: Secondary | ICD-10-CM

## 2013-11-30 DIAGNOSIS — I214 Non-ST elevation (NSTEMI) myocardial infarction: Secondary | ICD-10-CM

## 2013-11-30 DIAGNOSIS — G4700 Insomnia, unspecified: Secondary | ICD-10-CM

## 2013-11-30 DIAGNOSIS — A6000 Herpesviral infection of urogenital system, unspecified: Secondary | ICD-10-CM

## 2013-11-30 NOTE — Progress Notes (Signed)
Patient ID: Craig [name], male   DOB: Dec 26, 1925, 80 y.o.   MRN: [id number]   Code Status: DNR  Allergies  Allergen Reactions  . Penicillins Other (See Comments)    unknown    Chief Complaint  Patient presents with  . Medical Managment of Chronic Issues  . Acute Visit  . Hospitalization Follow-up    HPI: Patient is a 80 y.o. male seen in the SNF at [facility] today for evaluation of  and other chronic medical conditions. 11/24/2013   Hospitalized from 11/24/13 to 11/29/2013 for PNA(treated for 5 days with IV vancomycin and IV aztreonam 3 more days Levaquin to be completed at [facility]. Chest x-ray shows developing pneumonia).  Carbon monoxide exposur( Repeat carboxyhemoglobin levels is normal). Patient's EKG shows sinus rhythm with PVCs. NSTEMI: myoview negative for inducible ischemia, takes ASA  Problem List Items Addressed This Visit   CAP (community acquired pneumonia) - Primary     Persisted hacking cough noted today, will start DuoNeb q6hr x3 days then prn q6hr prn for 1 week. He was treated for 5 days with IV vancomycin and IV aztreonam while in hospital then discharged to SNF with 3 more days Levaquin to be completed. Chest x-ray shows developing pneumonia while in hospital. Repeat CXR in 1-2 weeks recommended.     Carbon monoxide exposure     Carbon monoxide exposur( Repeat carboxyhemoglobin levels is normal).    Genital herpes     Chronic Acyclovir 200mg  daily.      GERD (gastroesophageal reflux disease)     Asymptomatic, off PPI    HTN (hypertension)     Controlled w/o med    Hyponatremia     Chronic, last Na 131 11/26/13, repeat CMP    Insomnia     Presently has prn Lorazepam 0.5mg  as needed at bed time    NSTEMI (non-ST elevated myocardial infarction)     NSTEMI: myoview negative for inducible ischemia, takes ASA      Postoperative anemia due to acute blood loss     Resolved, last Hgb 12s. Repeat CBC    Prostate cancer     Presently he is taking  Bicalutamide       Review of Systems:  Review of Systems  Constitutional: Negative for fever, chills, weight loss, malaise/fatigue and diaphoresis.       Poor appetite.   HENT: Negative for congestion, ear discharge, ear pain, hearing loss, nosebleeds, sore throat and tinnitus.   Eyes: Negative for blurred vision, double vision, photophobia, pain, discharge and redness.  Respiratory: Positive for cough. Negative for hemoptysis, sputum production, shortness of breath, wheezing and stridor.   Cardiovascular: Negative for chest pain, palpitations, orthopnea, claudication, leg swelling and PND.  Gastrointestinal: Negative for heartburn, nausea, vomiting, abdominal pain, diarrhea, constipation and blood in stool.       Uses Laxatives.  Genitourinary: Positive for frequency. Negative for dysuria, urgency, hematuria and flank pain.  Musculoskeletal: Positive for joint pain. Negative for back pain, falls, myalgias and neck pain.  Skin: Negative for itching and rash.       Right knee surgical scar  Neurological: Negative for dizziness, tingling, tremors, sensory change, speech change, focal weakness, seizures, loss of consciousness, weakness and headaches.  Endo/Heme/Allergies: Negative for environmental allergies and polydipsia. Does not bruise/bleed easily.  Psychiatric/Behavioral: Negative for depression, suicidal ideas, hallucinations, memory loss and substance abuse. The patient has insomnia. The patient is not nervous/anxious.      Past Medical History  Diagnosis Date  .  Hypertension   . Degenerative disc disease   . Degenerative joint disease   . Genital herpes   . Diverticulitis   . Prostate cancer   . Lumbosacral spondylolysis   . Hip arthritis 09/29/2013    Bilateral   Past Surgical History  Procedure Laterality Date  . Total knee arthroplasty Right 06/21/2013    Procedure: RIGHT TOTAL KNEE ARTHROPLASTY;  Surgeon: Gearlean Alf, MD;  Location: [facility] ORS;  Service: Orthopedics;   Laterality: Right;  . Mastoidectomy Bilateral   . Cataract extraction Bilateral   . Tonsillectomy     Social History:   reports that he quit smoking about 29 years ago. He has never used smokeless tobacco. He reports that he drinks about 1.2 ounces of alcohol per week. His drug history is not on file.  Family History  Problem Relation Age of Onset  . Heart disease Mother     Medications: Patient's Medications  New Prescriptions   No medications on file  Previous Medications   ACYCLOVIR (ZOVIRAX) 200 MG CAPSULE    Take 200 mg by mouth daily.    ASPIRIN EC 81 MG EC TABLET    Take 1 tablet (81 mg total) by mouth daily.   ATORVASTATIN (LIPITOR) 10 MG TABLET    Take 10 mg by mouth every Monday, Wednesday, and Friday.    BICALUTAMIDE (CASODEX) 50 MG TABLET    Take 50 mg by mouth daily.   GUAIFENESIN-DEXTROMETHORPHAN (ROBITUSSIN DM) 100-10 MG/5ML SYRUP    Take 5 mLs by mouth every 4 (four) hours as needed for cough.   LEVOFLOXACIN (LEVAQUIN) 750 MG TABLET    Take 1 tablet (750 mg total) by mouth daily. Start 11/25/13   LORAZEPAM (ATIVAN) 0.5 MG TABLET    Take 1 tablet (0.5 mg total) by mouth at bedtime as needed for sleep.   UNABLE TO FIND    Physical Therapy Evaluate and Treat  Dx: Deconditioning s/p MI  Modified Medications   No medications on file  Discontinued Medications   No medications on file     Physical Exam:Physical Exam  Constitutional: He is oriented to person, place, and time. He appears well-developed and well-nourished. No distress.  HENT:  Head: Normocephalic and atraumatic.  Right Ear: External ear normal.  Left Ear: External ear normal.  Nose: Nose normal.  Mouth/Throat: Oropharynx is clear and moist. No oropharyngeal exudate.     Eyes: Conjunctivae and EOM are normal. Pupils are equal, round, and reactive to light. Right eye exhibits no discharge. Left eye exhibits no discharge. No scleral icterus.  Neck: Normal range of motion. Neck supple. No JVD present. No  tracheal deviation present. No thyromegaly present.  Cardiovascular: Normal rate, regular rhythm, normal heart sounds and intact distal pulses.   No murmur heard. Pulmonary/Chest: Effort normal and breath sounds normal. No stridor. No respiratory distress. He has no wheezes. He has no rales.  Abdominal: Soft. Bowel sounds are normal. He exhibits no distension. There is no tenderness. There is no rebound and no guarding.  Genitourinary:  Hx of genital herpes.   Musculoskeletal: He exhibits no edema and no tenderness.  R knee surgical scar   Lymphadenopathy:    He has cervical adenopathy.  Neurological: He is alert and oriented to person, place, and time. He has normal reflexes. No cranial nerve deficit. Coordination normal.  Skin: Skin is warm and dry. No rash noted. He is not diaphoretic. No erythema. No pallor.  Psychiatric: He has a normal mood and affect. His  behavior is normal. Judgment and thought content normal. Cognition and memory are normal.  insomnia    Filed Vitals:   11/30/13 1636  BP: 120/82  Pulse: 71  Temp: 98.7 F (37.1 C)  TempSrc: Tympanic  Resp: 20      Labs reviewed: Basic Metabolic Panel:  Recent Labs  06/25/13 0324 06/25/13 0805  06/26/13 0418 06/26/13 1627 06/27/13 0420  11/24/13 2330 11/25/13 0320 11/26/13 0949 11/29/13 0350  NA 112* 113*  < > 124* 123* 125*  < > 127* 130* 131*  --   K 3.5 3.2*  < > 3.7 3.6 3.4*  < > 4.1 3.5* 4.9  --   CL 79* 80*  < > 88* 88* 90*  < > 91* 94* 95*  --   CO2 26 24  < > 29 29 29   < > 21 23 26   --   GLUCOSE 100* 102*  < > 112* 112* 101*  < > 120* 125* 113*  --   BUN 8 8  < > 13 14 15   < > 14 13 13   --   CREATININE 0.64 0.62  < > 0.74 0.70 0.81  < > 0.73 0.73 0.77 0.78  CALCIUM 7.9* 8.0*  < > 8.3* 8.5 8.6  < > 9.1 8.4 9.2  --   PHOS  --   --   --   --  2.2* 2.0*  --   --   --   --   --   TSH  --  0.840  --   --   --   --   --   --   --   --   --   < > = values in this interval not displayed. Liver Function  Tests:  Recent Labs  06/15/13 0905 06/24/13 1320 06/26/13 1627 06/27/13 0420 11/24/13 2330  AST 22 20  --   --  28  ALT 16 12  --   --  21  ALKPHOS 39 46  --   --  65  BILITOT 0.5 0.9  --   --  0.6  PROT 6.9 5.3*  --   --  7.1  ALBUMIN 4.3 3.0* 2.9* 3.0* 3.9   No results found for this basename: LIPASE, AMYLASE,  in the last 8760 hours No results found for this basename: AMMONIA,  in the last 8760 hours CBC:  Recent Labs  12/15/12 0934  11/21/13 0137  11/23/13 0501 11/24/13 2330 11/25/13 0320  WBC 6.7  < > 10.8*  < > 9.1 10.4 10.0  NEUTROABS 4.0  --  8.3*  --   --  6.2  --   HGB 14.3  < > 14.6  < > 11.9* 13.8 12.1*  HCT 42.0  < > 39.5  < > 33.5* 38.4* 33.9*  MCV 100.8*  < > 91.0  < > 93.8 92.8 92.9  PLT 184  < > 207  < > 149* 185 177  < > = values in this interval not displayed. Lipid Panel:  Recent Labs  06/24/13 0458 11/22/13 0355  CHOL 126 132  HDL 62 77  LDLCALC 50 44  TRIG 72 55  CHOLHDL 2.0 1.7    Past Procedures:  11/21/13 CT angio chest  IMPRESSION:   1. Negative for pulmonary embolism.  2. Bibasilar lung opacities is likely a combination of pneumonia and  atelectasis.  3. COPD with bronchitis and emphysema.  4. Spine and rib sclerotic lesions, consistent with metastatic  spread  of the patient's prostate cancer. Given the recently negative  bone scan, these may be treated/sterile deposits. Correlate with PSA  trend.   11/28/13 CXR  IMPRESSION:  Persistent left lower lobe pneumonia and small parapneumonic  effusion     Assessment/Plan CAP (community acquired pneumonia) Persisted hacking cough noted today, will start DuoNeb q6hr x3 days then prn q6hr prn for 1 week. He was treated for 5 days with IV vancomycin and IV aztreonam while in hospital then discharged to SNF with 3 more days Levaquin to be completed. Chest x-ray shows developing pneumonia while in hospital. Repeat CXR in 1-2 weeks recommended.   Carbon monoxide exposure Carbon  monoxide exposur( Repeat carboxyhemoglobin levels is normal).  NSTEMI (non-ST elevated myocardial infarction) NSTEMI: myoview negative for inducible ischemia, takes ASA    Prostate cancer Presently he is taking Bicalutamide  Genital herpes Chronic Acyclovir 200mg  daily.    Insomnia Presently has prn Lorazepam 0.5mg  as needed at bed time  HTN (hypertension) Controlled w/o med  GERD (gastroesophageal reflux disease) Asymptomatic, off PPI  Hyponatremia Chronic, last Na 131 11/26/13, repeat CMP  Postoperative anemia due to acute blood loss Resolved, last Hgb 12s. Repeat CBC    Family/ Staff Communication: observe the patient.   Goals of Care: [place]  Labs/tests ordered: CBC, CMP, CXR

## 2013-11-30 NOTE — Assessment & Plan Note (Addendum)
Persisted hacking cough noted today, will start DuoNeb q6hr x3 days then prn q6hr prn for 1 week. He was treated for 5 days with IV vancomycin and IV aztreonam while in hospital then discharged to SNF with 3 more days Levaquin to be completed. Chest x-ray shows developing pneumonia while in hospital. Repeat CXR in 1-2 weeks recommended.

## 2013-12-01 ENCOUNTER — Encounter: Payer: Self-pay | Admitting: Nurse Practitioner

## 2013-12-01 NOTE — Assessment & Plan Note (Signed)
Resolved, last Hgb 12s. Repeat CBC

## 2013-12-01 NOTE — Assessment & Plan Note (Signed)
Carbon monoxide exposur( Repeat carboxyhemoglobin levels is normal).

## 2013-12-01 NOTE — Assessment & Plan Note (Signed)
Controlled w/o med.  

## 2013-12-01 NOTE — Assessment & Plan Note (Signed)
Presently has prn Lorazepam 0.5mg  as needed at bed time

## 2013-12-01 NOTE — Assessment & Plan Note (Signed)
Chronic Acyclovir 200mg  daily.

## 2013-12-01 NOTE — Assessment & Plan Note (Signed)
NSTEMI: myoview negative for inducible ischemia, takes ASA

## 2013-12-01 NOTE — Assessment & Plan Note (Signed)
Chronic, last Na 131 11/26/13, repeat CMP

## 2013-12-01 NOTE — Assessment & Plan Note (Signed)
Presently he is taking Bicalutamide 

## 2013-12-01 NOTE — Assessment & Plan Note (Signed)
Asymptomatic, off PPI

## 2013-12-03 ENCOUNTER — Encounter: Payer: Self-pay | Admitting: Internal Medicine

## 2013-12-03 ENCOUNTER — Non-Acute Institutional Stay (SKILLED_NURSING_FACILITY): Payer: Medicare Other | Admitting: Internal Medicine

## 2013-12-03 DIAGNOSIS — C61 Malignant neoplasm of prostate: Secondary | ICD-10-CM

## 2013-12-03 DIAGNOSIS — M1610 Unilateral primary osteoarthritis, unspecified hip: Secondary | ICD-10-CM

## 2013-12-03 DIAGNOSIS — A6000 Herpesviral infection of urogenital system, unspecified: Secondary | ICD-10-CM

## 2013-12-03 DIAGNOSIS — M47817 Spondylosis without myelopathy or radiculopathy, lumbosacral region: Secondary | ICD-10-CM

## 2013-12-03 DIAGNOSIS — Z7729 Contact with and (suspected ) exposure to other hazardous substances: Secondary | ICD-10-CM

## 2013-12-03 DIAGNOSIS — K219 Gastro-esophageal reflux disease without esophagitis: Secondary | ICD-10-CM

## 2013-12-03 DIAGNOSIS — E871 Hypo-osmolality and hyponatremia: Secondary | ICD-10-CM

## 2013-12-03 DIAGNOSIS — I214 Non-ST elevation (NSTEMI) myocardial infarction: Secondary | ICD-10-CM

## 2013-12-03 DIAGNOSIS — G4700 Insomnia, unspecified: Secondary | ICD-10-CM

## 2013-12-03 DIAGNOSIS — I10 Essential (primary) hypertension: Secondary | ICD-10-CM

## 2013-12-03 DIAGNOSIS — J189 Pneumonia, unspecified organism: Secondary | ICD-10-CM
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
## 2013-12-06 LAB — CBC AND DIFFERENTIAL
HEMATOCRIT: 35 % — AB (ref 41–53)
Hemoglobin: 12.5 g/dL — AB (ref 13.5–17.5)
PLATELETS: 604 10*3/uL — AB (ref 150–399)
WBC: 6.1 10^3/mL

## 2013-12-06 LAB — HEPATIC FUNCTION PANEL
ALK PHOS: 64 U/L (ref 25–125)
ALT: 29 U/L (ref 10–40)
AST: 23 U/L (ref 14–40)
Bilirubin, Total: 0.5 mg/dL

## 2013-12-06 LAB — BASIC METABOLIC PANEL
BUN: 14 mg/dL (ref 4–21)
Creatinine: 0.9 mg/dL (ref 0.6–1.3)
GLUCOSE: 88 mg/dL
Potassium: 4.2 mmol/L (ref 3.4–5.3)
Sodium: 137 mmol/L (ref 137–147)

## 2013-12-07 ENCOUNTER — Encounter: Payer: Self-pay | Admitting: Nurse Practitioner

## 2013-12-07 ENCOUNTER — Non-Acute Institutional Stay (SKILLED_NURSING_FACILITY): Payer: Medicare Other | Admitting: Nurse Practitioner

## 2013-12-07 DIAGNOSIS — C61 Malignant neoplasm of prostate: Secondary | ICD-10-CM

## 2013-12-07 DIAGNOSIS — J189 Pneumonia, unspecified organism: Secondary | ICD-10-CM

## 2013-12-07 DIAGNOSIS — K219 Gastro-esophageal reflux disease without esophagitis: Secondary | ICD-10-CM

## 2013-12-07 DIAGNOSIS — A6000 Herpesviral infection of urogenital system, unspecified: Secondary | ICD-10-CM

## 2013-12-07 DIAGNOSIS — I10 Essential (primary) hypertension: Secondary | ICD-10-CM

## 2013-12-07 DIAGNOSIS — G4700 Insomnia, unspecified: Secondary | ICD-10-CM

## 2013-12-07 NOTE — Assessment & Plan Note (Signed)
Presently has prn Lorazepam 0.5mg as needed at bed time 

## 2013-12-07 NOTE — Assessment & Plan Note (Addendum)
w/o med, noted mild elevated Bps: 144/94, 140/83, 130/70, 127/82, 140/88, 120/70, 140/86--will continue BP q shift, then re-evaluate.

## 2013-12-07 NOTE — Progress Notes (Signed)
Patient ID: Craig [name], male   DOB: 1926/01/02, 80 y.o.   MRN: [id number]   Code Status: DNR  Allergies  Allergen Reactions  . Penicillins Other (See Comments)    unknown    Chief Complaint  Patient presents with  . Medical Managment of Chronic Issues  . Acute Visit    f/u treated PNA    HPI: Patient is a 80 y.o. male seen in the SNF at [facility] today for evaluation of blood pressure, treated PNA, and other chronic medical conditions.   Hospitalized from 11/24/13 to 11/29/2013 for PNA(treated for 5 days with IV vancomycin and IV aztreonam 3 more days Levaquin to be completed at [facility]. Chest x-ray shows developing pneumonia).  Carbon monoxide exposur( Repeat carboxyhemoglobin levels is normal). Patient's EKG shows sinus rhythm with PVCs. NSTEMI: myoview negative for inducible ischemia, takes ASA  Problem List Items Addressed This Visit   CAP (community acquired pneumonia) - Primary     Resolved hacking cough after  DuoNeb q6hr x3 days then prn q6hr prn for 1 week. He was treated for 5 days with IV vancomycin and IV aztreonam while in hospital then completed 3 more days Levaquin in SNF  Chest x-ray shows developing pneumonia while in hospital. Repeat CXR 12/07/13 showed patchy atelectasis or pneumonia at the left lung base. Patient denied SOB, cough, chest pain/discomfort, or producing phlegm. [place] discharge [place] when cleared by Rehab.      Genital herpes     Chronic Acyclovir 200mg  daily.       GERD (gastroesophageal reflux disease)     Asymptomatic, off PPI     HTN (hypertension)     Controlled w/o med     Insomnia     Presently has prn Lorazepam 0.5mg  as needed at bed time     Prostate cancer     Presently he is taking Bicalutamide       Review of Systems:  Review of Systems  Constitutional: Negative for fever, chills, weight loss, malaise/fatigue and diaphoresis.  HENT: Negative for congestion, ear discharge, ear pain, hearing loss, nosebleeds, sore  throat and tinnitus.   Eyes: Negative for blurred vision, double vision, photophobia, pain, discharge and redness.  Respiratory: Positive for cough. Negative for hemoptysis, sputum production, shortness of breath, wheezing and stridor.        Much improved.   Cardiovascular: Negative for chest pain, palpitations, orthopnea, claudication, leg swelling and PND.  Gastrointestinal: Negative for heartburn, nausea, vomiting, abdominal pain, diarrhea, constipation and blood in stool.       Uses Laxatives.  Genitourinary: Positive for frequency. Negative for dysuria, urgency, hematuria and flank pain.  Musculoskeletal: Positive for joint pain. Negative for back pain, falls, myalgias and neck pain.  Skin: Negative for itching and rash.       Right knee surgical scar  Neurological: Negative for dizziness, tingling, tremors, sensory change, speech change, focal weakness, seizures, loss of consciousness, weakness and headaches.  Endo/Heme/Allergies: Negative for environmental allergies and polydipsia. Does not bruise/bleed easily.  Psychiatric/Behavioral: Negative for depression, suicidal ideas, hallucinations, memory loss and substance abuse. The patient has insomnia. The patient is not nervous/anxious.      Past Medical History  Diagnosis Date  . Hypertension   . Degenerative disc disease   . Degenerative joint disease   . Genital herpes   . Diverticulitis   . Prostate cancer 2012    mets to bone  . Lumbosacral spondylolysis   . Hip arthritis 09/29/2013  Bilateral  . Pneumonia 11/24/13  . Other and unspecified hyperlipidemia   . Malignant neoplasm of prostate   . GERD (gastroesophageal reflux disease)   . Cervicalgia   . Anxiety state, unspecified   . Diverticulosis   . TIA (transient ischemic attack)   . Lumbago   . Carbon monoxide exposure 11/24/13  . Hypoxia 11/24/13  . Hyponatremia 11/24/13  . NSTEMI (non-ST elevated myocardial infarction) 11/20/13  . CAD (coronary artery disease) 11/20/13    . Genital herpes 07/10/2013  . Insomnia 07/09/2013  . Postoperative anemia due to acute blood loss 06/22/2013  . OA (osteoarthritis) of knee 06/21/2013  . Lumbosacral spondylosis without myelopathy 09/29/2013   Past Surgical History  Procedure Laterality Date  . Total knee arthroplasty Right 06/21/2013    Procedure: RIGHT TOTAL KNEE ARTHROPLASTY;  Surgeon: Gearlean Alf, MD;  Location: [facility];  Service: Orthopedics;  Laterality: Right;. Dr. Wynelle Link  . Mastoidectomy Bilateral   . Cataract extraction Bilateral   . Tonsillectomy     Social History:   reports that he quit smoking about 29 years ago. He has never used smokeless tobacco. He reports that he drinks about 1.2 ounces of alcohol per week. His drug history is not on file.  Family History  Problem Relation Age of Onset  . Heart disease Mother     Medications: Patient's Medications  New Prescriptions   No medications on file  Previous Medications   ACYCLOVIR (ZOVIRAX) 200 MG CAPSULE    Take 200 mg by mouth daily.    ASPIRIN EC 81 MG EC TABLET    Take 1 tablet (81 mg total) by mouth daily.   ATORVASTATIN (LIPITOR) 10 MG TABLET    Take 10 mg by mouth every Monday, Wednesday, and Friday.    BICALUTAMIDE (CASODEX) 50 MG TABLET    Take 50 mg by mouth daily.   GUAIFENESIN-DEXTROMETHORPHAN (ROBITUSSIN DM) 100-10 MG/5ML SYRUP    Take 5 mLs by mouth every 4 (four) hours as needed for cough.   LEVOFLOXACIN (LEVAQUIN) 750 MG TABLET    Take 1 tablet (750 mg total) by mouth daily. Start 11/25/13   LORAZEPAM (ATIVAN) 0.5 MG TABLET    Take 1 tablet (0.5 mg total) by mouth at bedtime as needed for sleep.   UNABLE TO FIND    Physical Therapy Evaluate and Treat  Dx: Deconditioning s/p MI  Modified Medications   No medications on file  Discontinued Medications   No medications on file     Physical Exam:Physical Exam  Constitutional: He is oriented to person, place, and time. He appears well-developed and well-nourished. No distress.   HENT:  Head: Normocephalic and atraumatic.  Right Ear: External ear normal.  Left Ear: External ear normal.  Nose: Nose normal.  Mouth/Throat: Oropharynx is clear and moist. No oropharyngeal exudate.     Eyes: Conjunctivae and EOM are normal. Pupils are equal, round, and reactive to light. Right eye exhibits no discharge. Left eye exhibits no discharge. No scleral icterus.  Neck: Normal range of motion. Neck supple. No JVD present. No tracheal deviation present. No thyromegaly present.  Cardiovascular: Normal rate, regular rhythm, normal heart sounds and intact distal pulses.   No murmur heard. Pulmonary/Chest: Effort normal and breath sounds normal. No stridor. No respiratory distress. He has no wheezes. He has no rales.  Abdominal: Soft. Bowel sounds are normal. He exhibits no distension. There is no tenderness. There is no rebound and no guarding.  Genitourinary:  Hx of genital herpes.  Musculoskeletal: He exhibits no edema and no tenderness.  R knee surgical scar   Lymphadenopathy:    He has cervical adenopathy.  Neurological: He is alert and oriented to person, place, and time. He has normal reflexes. No cranial nerve deficit. Coordination normal.  Skin: Skin is warm and dry. No rash noted. He is not diaphoretic. No erythema. No pallor.  Psychiatric: He has a normal mood and affect. His behavior is normal. Judgment and thought content normal. Cognition and memory are normal.  insomnia    Filed Vitals:   12/07/13 1435  BP: 144/94  Pulse: 78  Temp: 97.7 F (36.5 C)  TempSrc: Tympanic  Resp: 18      Labs reviewed: Basic Metabolic Panel:  Recent Labs  06/25/13 0324 06/25/13 0805  06/26/13 0418 06/26/13 1627 06/27/13 0420  11/24/13 2330 11/25/13 0320 11/26/13 0949 11/29/13 0350 12/06/13  NA 112* 113*  < > 124* 123* 125*  < > 127* 130* 131*  --  137  K 3.5 3.2*  < > 3.7 3.6 3.4*  < > 4.1 3.5* 4.9  --  4.2  CL 79* 80*  < > 88* 88* 90*  < > 91* 94* 95*  --   --    CO2 26 24  < > 29 29 29   < > 21 23 26   --   --   GLUCOSE 100* 102*  < > 112* 112* 101*  < > 120* 125* 113*  --   --   BUN 8 8  < > 13 14 15   < > 14 13 13   --  14  CREATININE 0.64 0.62  < > 0.74 0.70 0.81  < > 0.73 0.73 0.77 0.78 0.9  CALCIUM 7.9* 8.0*  < > 8.3* 8.5 8.6  < > 9.1 8.4 9.2  --   --   PHOS  --   --   --   --  2.2* 2.0*  --   --   --   --   --   --   TSH  --  0.840  --   --   --   --   --   --   --   --   --   --   < > = values in this interval not displayed. Liver Function Tests:  Recent Labs  06/15/13 0905 06/24/13 1320 06/26/13 1627 06/27/13 0420 11/24/13 2330 12/06/13  AST 22 20  --   --  28 23  ALT 16 12  --   --  21 29  ALKPHOS 39 46  --   --  65 64  BILITOT 0.5 0.9  --   --  0.6  --   PROT 6.9 5.3*  --   --  7.1  --   ALBUMIN 4.3 3.0* 2.9* 3.0* 3.9  --    CBC:  Recent Labs  12/15/12 0934  11/21/13 0137  11/23/13 0501 11/24/13 2330 11/25/13 0320 12/06/13  WBC 6.7  < > 10.8*  < > 9.1 10.4 10.0 6.1  NEUTROABS 4.0  --  8.3*  --   --  6.2  --   --   HGB 14.3  < > 14.6  < > 11.9* 13.8 12.1* 12.5*  HCT 42.0  < > 39.5  < > 33.5* 38.4* 33.9* 35*  MCV 100.8*  < > 91.0  < > 93.8 92.8 92.9  --   PLT 184  < > 207  < > 149* 185 177  604*  < > = values in this interval not displayed. Lipid Panel:  Recent Labs  06/24/13 0458 11/22/13 0355  CHOL 126 132  HDL 62 77  LDLCALC 50 44  TRIG 72 55  CHOLHDL 2.0 1.7    Past Procedures:  11/21/13 CT angio chest  IMPRESSION:   1. Negative for pulmonary embolism.  2. Bibasilar lung opacities is likely a combination of pneumonia and  atelectasis.  3. COPD with bronchitis and emphysema.  4. Spine and rib sclerotic lesions, consistent with metastatic  spread of the patient's prostate cancer. Given the recently negative  bone scan, these may be treated/sterile deposits. Correlate with PSA  trend.   11/28/13 CXR  IMPRESSION:  Persistent left lower lobe pneumonia and small parapneumonic  effusion  12/07/13  CXR  No cardiomegaly or pulmonary vascular congestion, patchy atelectasis or pneumonia at the left lung base, small left pleural effusion.    Assessment/Plan CAP (community acquired pneumonia) Resolved hacking cough after  DuoNeb q6hr x3 days then prn q6hr prn for 1 week. He was treated for 5 days with IV vancomycin and IV aztreonam while in hospital then completed 3 more days Levaquin in SNF  Chest x-ray shows developing pneumonia while in hospital. Repeat CXR 12/07/13 showed patchy atelectasis or pneumonia at the left lung base. Patient denied SOB, cough, chest pain/discomfort, or producing phlegm. [place] may discharge [place] when cleared by Rehab.    Genital herpes Chronic Acyclovir 200mg  daily.     GERD (gastroesophageal reflux disease) Asymptomatic, off PPI   HTN (hypertension) Controlled w/o med   Insomnia Presently has prn Lorazepam 0.5mg  as needed at bed time   Prostate cancer Presently he is taking Bicalutamide    Family/ Staff Communication: observe the patient.   Goals of Care: [place]  Labs/tests ordered: none

## 2013-12-07 NOTE — Assessment & Plan Note (Signed)
Presently he is taking Bicalutamide

## 2013-12-07 NOTE — Assessment & Plan Note (Signed)
Asymptomatic, off PPI 

## 2013-12-07 NOTE — Assessment & Plan Note (Signed)
Resolved hacking cough after  DuoNeb q6hr x3 days then prn q6hr prn for 1 week. He was treated for 5 days with IV vancomycin and IV aztreonam while in hospital then completed 3 more days Levaquin in SNF  Chest x-ray shows developing pneumonia while in hospital. Repeat CXR 12/07/13 showed patchy atelectasis or pneumonia at the left lung base. Patient denied SOB, cough, chest pain/discomfort, or producing phlegm. He may discharge to [place] when cleared by Rehab.

## 2013-12-07 NOTE — Assessment & Plan Note (Signed)
Chronic Acyclovir 200mg daily.   

## 2013-12-08 ENCOUNTER — Encounter: Payer: Self-pay | Admitting: *Deleted

## 2014-05-14 ENCOUNTER — Encounter (HOSPITAL_COMMUNITY): Payer: Self-pay | Admitting: Emergency Medicine

## 2014-05-14 ENCOUNTER — Emergency Department (HOSPITAL_COMMUNITY)
Admission: EM | Admit: 2014-05-14 | Discharge: 2014-05-14 | Disposition: A | Discharge status: Home / Self Care | Payer: Medicare Other | Attending: Emergency Medicine | Admitting: Emergency Medicine

## 2014-05-14 DIAGNOSIS — I10 Essential (primary) hypertension: Secondary | ICD-10-CM

## 2014-05-14 DIAGNOSIS — Z7982 Long term (current) use of aspirin: Secondary | ICD-10-CM | POA: Insufficient documentation

## 2014-05-14 DIAGNOSIS — R799 Abnormal finding of blood chemistry, unspecified: Secondary | ICD-10-CM | POA: Insufficient documentation

## 2014-05-14 DIAGNOSIS — I2510 Atherosclerotic heart disease of native coronary artery without angina pectoris: Secondary | ICD-10-CM | POA: Diagnosis not present

## 2014-05-14 DIAGNOSIS — Z862 Personal history of diseases of the blood and blood-forming organs and certain disorders involving the immune mechanism: Secondary | ICD-10-CM | POA: Insufficient documentation

## 2014-05-14 DIAGNOSIS — Z7729 Contact with and (suspected ) exposure to other hazardous substances: Secondary | ICD-10-CM | POA: Insufficient documentation

## 2014-05-14 DIAGNOSIS — IMO0002 Reserved for concepts with insufficient information to code with codable children: Secondary | ICD-10-CM | POA: Diagnosis not present

## 2014-05-14 DIAGNOSIS — Z8546 Personal history of malignant neoplasm of prostate: Secondary | ICD-10-CM | POA: Insufficient documentation

## 2014-05-14 DIAGNOSIS — Z8673 Personal history of transient ischemic attack (TIA), and cerebral infarction without residual deficits: Secondary | ICD-10-CM | POA: Insufficient documentation

## 2014-05-14 DIAGNOSIS — M1710 Unilateral primary osteoarthritis, unspecified knee: Secondary | ICD-10-CM | POA: Insufficient documentation

## 2014-05-14 DIAGNOSIS — I252 Old myocardial infarction: Secondary | ICD-10-CM | POA: Insufficient documentation

## 2014-05-14 DIAGNOSIS — E785 Hyperlipidemia, unspecified: Secondary | ICD-10-CM | POA: Insufficient documentation

## 2014-05-14 DIAGNOSIS — Z8701 Personal history of pneumonia (recurrent): Secondary | ICD-10-CM | POA: Diagnosis not present

## 2014-05-14 DIAGNOSIS — Z8719 Personal history of other diseases of the digestive system: Secondary | ICD-10-CM | POA: Diagnosis not present

## 2014-05-14 DIAGNOSIS — M1610 Unilateral primary osteoarthritis, unspecified hip: Secondary | ICD-10-CM | POA: Insufficient documentation

## 2014-05-14 DIAGNOSIS — I498 Other specified cardiac arrhythmias: Secondary | ICD-10-CM | POA: Insufficient documentation

## 2014-05-14 DIAGNOSIS — Z87891 Personal history of nicotine dependence: Secondary | ICD-10-CM | POA: Insufficient documentation

## 2014-05-14 DIAGNOSIS — Z8619 Personal history of other infectious and parasitic diseases: Secondary | ICD-10-CM | POA: Diagnosis not present

## 2014-05-14 DIAGNOSIS — R778 Other specified abnormalities of plasma proteins: Secondary | ICD-10-CM

## 2014-05-14 DIAGNOSIS — Z880 Allergy status to penicillin: Secondary | ICD-10-CM | POA: Insufficient documentation

## 2014-05-14 DIAGNOSIS — F411 Generalized anxiety disorder: Secondary | ICD-10-CM | POA: Diagnosis not present

## 2014-05-14 DIAGNOSIS — R7989 Other specified abnormal findings of blood chemistry: Secondary | ICD-10-CM

## 2014-05-14 DIAGNOSIS — Z79899 Other long term (current) drug therapy: Secondary | ICD-10-CM | POA: Insufficient documentation

## 2014-05-14 LAB — I-STAT TROPONIN, ED
TROPONIN I, POC: 0.1 ng/mL — AB (ref 0.00–0.08)
TROPONIN I, POC: 0.15 ng/mL — AB (ref 0.00–0.08)

## 2014-05-14 LAB — I-STAT CHEM 8, ED
BUN: 18 mg/dL (ref 6–23)
CREATININE: 0.8 mg/dL (ref 0.50–1.35)
Calcium, Ion: 1.19 mmol/L (ref 1.13–1.30)
Chloride: 100 mEq/L (ref 96–112)
Glucose, Bld: 115 mg/dL — ABNORMAL HIGH (ref 70–99)
HCT: 45 % (ref 39.0–52.0)
HEMOGLOBIN: 15.3 g/dL (ref 13.0–17.0)
Potassium: 4 mEq/L (ref 3.7–5.3)
SODIUM: 135 meq/L — AB (ref 137–147)
TCO2: 29 mmol/L (ref 0–100)

## 2014-05-14 LAB — MAGNESIUM: Magnesium: 2 mg/dL (ref 1.5–2.5)

## 2014-05-14 NOTE — Discharge Instructions (Signed)
Hypertension °Hypertension, commonly called high blood pressure, is when the force of blood pumping through your arteries is too strong. Your arteries are the blood vessels that carry blood from your heart throughout your body. A blood pressure reading consists of a higher number over a lower number, such as 110/72. The higher number (systolic) is the pressure inside your arteries when your heart pumps. The lower number (diastolic) is the pressure inside your arteries when your heart relaxes. Ideally you want your blood pressure below 120/80. °Hypertension forces your heart to work harder to pump blood. Your arteries may become narrow or stiff. Having hypertension puts you at risk for heart disease, stroke, and other problems.  °RISK FACTORS °Some risk factors for high blood pressure are controllable. Others are not.  °Risk factors you cannot control include:  °· Race. You may be at higher risk if you are African American. °· Age. Risk increases with age. °· Gender. Men are at higher risk than women before age 45 years. After age 65, women are at higher risk than men. °Risk factors you can control include: °· Not getting enough exercise or physical activity. °· Being overweight. °· Getting too much fat, sugar, calories, or salt in your diet. °· Drinking too much alcohol. °SIGNS AND SYMPTOMS °Hypertension does not usually cause signs or symptoms. Extremely high blood pressure (hypertensive crisis) may cause headache, anxiety, shortness of breath, and nosebleed. °DIAGNOSIS  °To check if you have hypertension, your health care provider will measure your blood pressure while you are seated, with your arm held at the level of your heart. It should be measured at least twice using the same arm. Certain conditions can cause a difference in blood pressure between your right and left arms. A blood pressure reading that is higher than normal on one occasion does not mean that you need treatment. If one blood pressure reading  is high, ask your health care provider about having it checked again. °TREATMENT  °Treating high blood pressure includes making lifestyle changes and possibly taking medicine. Living a healthy lifestyle can help lower high blood pressure. You may need to change some of your habits. °Lifestyle changes may include: °· Following the DASH diet. This diet is high in fruits, vegetables, and whole grains. It is low in salt, red meat, and added sugars. °· Getting at least 2½ hours of brisk physical activity every week. °· Losing weight if necessary. °· Not smoking. °· Limiting alcoholic beverages. °· Learning ways to reduce stress. ° If lifestyle changes are not enough to get your blood pressure under control, your health care provider may prescribe medicine. You may need to take more than one. Work closely with your health care provider to understand the risks and benefits. °HOME CARE INSTRUCTIONS °· Have your blood pressure rechecked as directed by your health care provider.   °· Take medicines only as directed by your health care provider. Follow the directions carefully. Blood pressure medicines must be taken as prescribed. The medicine does not work as well when you skip doses. Skipping doses also puts you at risk for problems.   °· Do not smoke.   °· Monitor your blood pressure at home as directed by your health care provider.  °SEEK MEDICAL CARE IF:  °· You think you are having a reaction to medicines taken. °· You have recurrent headaches or feel dizzy. °· You have swelling in your ankles. °· You have trouble with your vision. °SEEK IMMEDIATE MEDICAL CARE IF: °· You develop a severe headache or confusion. °·   You have unusual weakness, numbness, or feel faint.  You have severe chest or abdominal pain.  You vomit repeatedly.  You have trouble breathing. MAKE SURE YOU:   Understand these instructions.  Will watch your condition.  Will get help right away if you are not doing well or get worse. Document  Released: 08/12/2005 Document Revised: 12/27/2013 Document Reviewed: 06/04/2013 [facility] Patient Information 2015 [place], [place]. This information is not intended to replace advice given to you by your health care provider. Make sure you discuss any questions you have with your health care provider.   Myocardial Infarction A myocardial infarction (MI) is also called a heart attack. It causes damage to the heart that cannot be fixed. An MI often happens when a blood clot or other blockage cuts blood flow to the heart. When this happens, certain areas of the heart begin to die. This is an emergency. HOME CARE  Take medicine as told by your doctor.  Change certain behaviors as told by your doctor. This may include:  Quitting smoking.  Being active.  Keeping a healthy weight.  Eating a heart-healthy diet. Ask your doctor for help with this diet.  Keeping your diabetes under control.  Lessening stress.  Limiting how much alcohol you drink. GET HELP RIGHT AWAY IF:  You have crushing or pressure-like chest pain that spreads to the arms, back, neck, or jaw. Call your local emergency services (911 in [place] Do not drive yourself to the hospital.  You have severe chest pain.  You have shortness of breath during rest, sleep, or with activity.  You have sudden sweating or clammy skin.  You feel sick to your stomach (nauseous) and throw up (vomit).  You suddenly get lightheaded or dizzy.  You feel your heart beating fast or skipping beats. MAKE SURE YOU:   Understand these instructions.  Will watch your condition.  Will get help right away if you are not doing well or get worse. Document Released: 02/11/2012 Document Reviewed: 10/15/2013 [facility] Patient Information 2015 [place]. This information is not intended to replace advice given to you by your health care provider. Make sure you discuss any questions you have with your health care provider.

## 2014-05-14 NOTE — ED Notes (Signed)
Bed: WA06 Expected date:  Expected time:  Means of arrival:  Comments: EMS hypertension; jittery

## 2014-05-14 NOTE — ED Provider Notes (Signed)
CSN: [id number]     Arrival date & time 05/14/14  0224 History   First MD Initiated Contact with Patient 05/14/14 0440     Chief Complaint  Patient presents with  . Hypertension     (Consider location/radiation/quality/duration/timing/severity/associated sxs/prior Treatment) HPI Comments: NATHEN BALABAN is a 80 y.o. male with a PMHx of HTN, HLD, prostate CA, GERD, remote TIA, CAD, NSTEMI 10/2013, and remote hx of PNA, who presents to the ED via EMS for complaints of HTN occurring this evening. States he felt "jittery" which usually occurs when his BP is elevated, he took it at home and it was 210/100. Pt took Lisinopril 40mg  and called EMS. Pt denied CP, SOB, fevers, chills, syncope, vision changes, lightheadedness, dizziness, cough, LE swelling, jaw/arm/back pain, abd pain, n/v/d, myalgias, arthralgias, weakness, or paresthesias. States upon arrival and awaiting to be seen, his jitteriness has resolved and he feels fine now. Denies EtOH or drug use.  Patient is a 80 y.o. male presenting with hypertension. The history is provided by the patient. No language interpreter was used.  Hypertension This is a recurrent problem. The current episode started today. The problem occurs intermittently. The problem has been resolved. Pertinent negatives include no abdominal pain, arthralgias, chest pain, chills, congestion, coughing, fatigue, fever, headaches, myalgias, nausea, neck pain, numbness, urinary symptoms, vertigo, visual change, vomiting or weakness. Nothing aggravates the symptoms. Treatments tried: Lisinopril 40mg  dose. The treatment provided significant relief.    Past Medical History  Diagnosis Date  . Hypertension   . Degenerative disc disease   . Degenerative joint disease   . Genital herpes   . Diverticulitis   . Prostate cancer 2012    mets to bone  . Lumbosacral spondylolysis   . Hip arthritis 09/29/2013    Bilateral  . Pneumonia 11/24/13  . Other and unspecified hyperlipidemia   .  Malignant neoplasm of prostate   . GERD (gastroesophageal reflux disease)   . Cervicalgia   . Anxiety state, unspecified   . Diverticulosis   . TIA (transient ischemic attack)   . Lumbago   . Carbon monoxide exposure 11/24/13  . Hypoxia 11/24/13  . Hyponatremia 11/24/13  . NSTEMI (non-ST elevated myocardial infarction) 11/20/13  . CAD (coronary artery disease) 11/20/13  . Genital herpes 07/10/2013  . Insomnia 07/09/2013  . Postoperative anemia due to acute blood loss 06/22/2013  . OA (osteoarthritis) of knee 06/21/2013  . Lumbosacral spondylosis without myelopathy 09/29/2013   Past Surgical History  Procedure Laterality Date  . Total knee arthroplasty Right 06/21/2013    Procedure: RIGHT TOTAL KNEE ARTHROPLASTY;  Surgeon: Gearlean Alf, MD;  Location: [facility];  Service: Orthopedics;  Laterality: Right;. Dr. Wynelle Link  . Mastoidectomy Bilateral   . Cataract extraction Bilateral   . Tonsillectomy     Family History  Problem Relation Age of Onset  . Heart disease Mother    History  Substance Use Topics  . Smoking status: Former Smoker -- 2.00 packs/day for 15 years    Quit date: 01/14/1984  . Smokeless tobacco: Never Used  . Alcohol Use: 1.2 oz/week    2 Shots of liquor per week     Comment: occasional    Review of Systems  Constitutional: Negative for fever, chills and fatigue.  HENT: Negative for congestion.   Respiratory: Negative for cough and shortness of breath.   Cardiovascular: Negative for chest pain, palpitations and leg swelling.  Gastrointestinal: Negative for nausea, vomiting, abdominal pain and diarrhea.  Genitourinary: Negative for dysuria  and hematuria.  Musculoskeletal: Negative for arthralgias, back pain, myalgias and neck pain.  Skin: Negative for color change.  Neurological: Negative for dizziness, vertigo, syncope, facial asymmetry, speech difficulty, weakness, light-headedness, numbness and headaches.  Psychiatric/Behavioral: Negative for confusion.  10  Systems reviewed and are negative for acute change except as noted in the HPI.     Allergies  Penicillins  Home Medications   Prior to Admission medications   Medication Sig Start Date End Date Taking? Authorizing Provider  acyclovir (ZOVIRAX) 200 MG capsule Take 200 mg by mouth daily.  08/27/11  Yes Historical Provider, MD  amLODipine (NORVASC) 5 MG tablet Take 5 mg by mouth daily.   Yes Historical Provider, MD  aspirin EC 81 MG EC tablet Take 1 tablet (81 mg total) by mouth daily. 11/24/13  Yes Orson Eva, MD  atorvastatin (LIPITOR) 10 MG tablet Take 5 mg by mouth every Monday, Wednesday, and Friday.  04/23/12  Yes Historical Provider, MD  B Complex-Biotin-FA (SUPER B-100 PO) Take 1 tablet by mouth daily.   Yes Historical Provider, MD  bicalutamide (CASODEX) 50 MG tablet Take 50 mg by mouth daily. 09/16/13  Yes Historical Provider, MD  clopidogrel (PLAVIX) 75 MG tablet Take 75 mg by mouth daily.   Yes Historical Provider, MD  denosumab (XGEVA) 120 MG/1.7ML SOLN injection Inject 120 mg into the skin every 30 (thirty) days.   Yes Historical Provider, MD  leuprolide (LUPRON) 30 MG injection Inject 30 mg into the muscle every 4 (four) months.   Yes Historical Provider, MD  lisinopril (PRINIVIL,ZESTRIL) 40 MG tablet Take 40 mg by mouth daily.   Yes Historical Provider, MD  LORazepam (ATIVAN) 0.5 MG tablet Take 1 tablet (0.5 mg total) by mouth at bedtime as needed for sleep. 11/29/13  Yes Hosie Poisson, MD   BP 133/86  Pulse 52  Temp(Src) 98 F (36.7 C) (Oral)  Resp 15  Ht 5\' 9"  (1.753 m)  Wt 155 lb (70.308 kg)  BMI 22.88 kg/m2  SpO2 98% Physical Exam  Nursing note and vitals reviewed. Constitutional: He is oriented to person, place, and time. Vital signs are normal. He appears well-developed and well-nourished. No distress.  VSS upon exam, BP 144/84, HR 68, alert and cooperative, pleasant, well appearing  HENT:  Head: Normocephalic and atraumatic.  Mouth/Throat: Mucous membranes are normal.   Eyes: Conjunctivae and EOM are normal. Pupils are equal, round, and reactive to light. Right eye exhibits no discharge. Left eye exhibits no discharge.  Neck: Normal range of motion. Neck supple. No JVD present. Carotid bruit is not present.  Cardiovascular: Normal rate, normal heart sounds and intact distal pulses.  An irregular rhythm present. Exam reveals no gallop and no friction rub.   No murmur heard. HR 68 during exam but intermittently becoming bradycardic to the 50s. Intermittent PVCs on monitor, mildly irregular rhythm but no regularity. Distal pulses intact. No abnormal heart sounds.  Pulmonary/Chest: Effort normal and breath sounds normal. No respiratory distress. He has no decreased breath sounds. He has no wheezes. He has no rhonchi. He has no rales.  Abdominal: Soft. Normal appearance and bowel sounds are normal. He exhibits no distension. There is no tenderness. There is no rigidity, no rebound and no guarding.  Musculoskeletal: Normal range of motion.  MAE x4, gait steady, strength 5/5 in all extremities, sensation grossly intact in all extremities  Neurological: He is alert and oriented to person, place, and time. He has normal strength. No cranial nerve deficit or sensory deficit.  He displays a negative Romberg sign. Coordination and gait normal.  CN 2-12 grossly intact, strength and sensation grossly intact, gait steady and nonataxic, neg romberg, neg pronator drift, neg cerebellar dysfunction with good coordination with finger-to-nose and heel-shin testing  Skin: Skin is warm, dry and intact. No rash noted.  Psychiatric: He has a normal mood and affect.    ED Course  Procedures (including critical care time) [place], ED - Abnormal; Notable for the following:    Troponin i, poc 0.10 (*)    All other components within normal limits  I-STAT CHEM 8, ED - Abnormal; Notable for the following:    Sodium 135 (*)    Glucose, Bld 115 (*)     All other components within normal limits  MAGNESIUM    Imaging Review No results found.   EKG Interpretation None    EKG: sinus arrhythmia with PVCs  MDM   Final diagnoses:  None    80y/o male with heart hx here for HTN episode which resolved prior to exam. BP 140s/80s but pt persistently bradycardic to the 50s. Given age and hx of NSTEMI w/ complaints of of "jitteriness", HTN and now bradycardic, will obtain istat trop and chem 8 to ensure no abnormalities. EKG showing sinus arrhythmia with PVCs. Pt never having bradycardia in past. Will reassess shortly.   6:01 AM Trop elev at 0.10. Discussed with Dr. Lita Mains, will repeat troponin and as long as no further elevation, pt safe for d/c. Will sign out pt to Dr. Tomi Bamberger and Starlyn Skeans for ongoing care. Please see their dictation for further dispo.   BP 148/80  Pulse 47  Temp(Src) 98 F (36.7 C) (Oral)  Resp 15  Ht 5\' 9"  (1.753 m)  Wt 155 lb (70.308 kg)  BMI 22.88 kg/m2  SpO2 95%   [employer], PA-C 05/14/14 [phone]

## 2014-05-14 NOTE — ED Provider Notes (Signed)
Pt seen and examined.  Pt has not had chest pain.  He has been asymptomatic.  Discussed case with cardiology.  Stable for discharge.  Warning signs and precautions discussed.  Dorie Rank, MD 05/14/14 1018

## 2014-05-14 NOTE — ED Provider Notes (Signed)
Patient is an 80 y.o. Male who presents to the ED with hypertension.  Patient was signed out to me at shift change by [facility] PA-C.  Patient was to have a repeat troponin due to the first istat troponin elevation.  Second troponin shows increase from .1 to .15.  Patient on my examination remains asymptomatic.  I have discussed the patient with the on call cardiologist Dr. Sallyanne Kuster who feels that given that the patient is asymptomatic, EKG appears to have no acute changes or abnormalities, and patient had an episode of severe hypertension that the troponin elevation was due likely to the hypertensive episode.  Cardiologist felt that the patient was stable for discharge at this time.  Patient was also discussed with Dr. Tomi Bamberger who agrees with the above plan.  Patient is to follow-up with his PCP on Monday and to follow-up with his cardiologist closely.  Patient to return immediately for ACS symptoms.  Patient states understanding and agreement at this time.  Vital signs are stable at this time.      Cherylann Parr, PA-C 05/14/14 1014

## 2014-05-14 NOTE — ED Notes (Signed)
Per EMS pt woke up feeling "jittery."  Pt told EMS that when he feels this way it usually means that his BP is elevated so pt took another of his HTN medication as well as another blood thinner.  Pt is A&O x 3.  In NAD.

## 2014-05-14 NOTE — ED Notes (Signed)
Patient says he started to get nervous, so he took his blood pressure and says it was high. Patient says he checked his BP around one Oclock and it was over 200. So patient decided to come to the hospital

## 2014-05-14 NOTE — ED Notes (Signed)
Both EDP Audie Pinto and EDP Hillard Danker notified of elevated troponin. RN Ajsa notified.

## 2014-05-15 NOTE — ED Provider Notes (Signed)
Medical screening examination/treatment/procedure(s) were performed by non-physician practitioner and as supervising physician I was immediately available for consultation/collaboration.   EKG Interpretation   Date/Time:  Saturday May 14 2014 02:43:41 EDT Ventricular Rate:  56 PR Interval:  152 QRS Duration: 80 QT Interval:  450 QTC Calculation: 434 R Axis:   80 Text Interpretation:  Sinus arrhythmia Multiform ventricular premature  complexes Borderline low voltage, extremity leads Since last tracing rate  slower Confirmed by KNAPP  MD-J, JON ([id number]) on 05/14/2014 10:03:56 AM        Julianne Rice, MD 05/15/14 [phone]

## 2014-05-16 ENCOUNTER — Emergency Department (HOSPITAL_COMMUNITY)
Admission: EM | Admit: 2014-05-16 | Discharge: 2014-05-16 | Disposition: A | Discharge status: Home / Self Care | Payer: Medicare Other | Attending: Emergency Medicine | Admitting: Emergency Medicine

## 2014-05-16 ENCOUNTER — Emergency Department (HOSPITAL_COMMUNITY): Payer: Medicare Other

## 2014-05-16 ENCOUNTER — Encounter (HOSPITAL_COMMUNITY): Payer: Self-pay | Admitting: Emergency Medicine

## 2014-05-16 DIAGNOSIS — I252 Old myocardial infarction: Secondary | ICD-10-CM | POA: Insufficient documentation

## 2014-05-16 DIAGNOSIS — E785 Hyperlipidemia, unspecified: Secondary | ICD-10-CM | POA: Insufficient documentation

## 2014-05-16 DIAGNOSIS — Z7982 Long term (current) use of aspirin: Secondary | ICD-10-CM | POA: Diagnosis not present

## 2014-05-16 DIAGNOSIS — Z8546 Personal history of malignant neoplasm of prostate: Secondary | ICD-10-CM | POA: Insufficient documentation

## 2014-05-16 DIAGNOSIS — J3489 Other specified disorders of nose and nasal sinuses: Secondary | ICD-10-CM | POA: Diagnosis not present

## 2014-05-16 DIAGNOSIS — Z79899 Other long term (current) drug therapy: Secondary | ICD-10-CM | POA: Insufficient documentation

## 2014-05-16 DIAGNOSIS — I10 Essential (primary) hypertension: Secondary | ICD-10-CM | POA: Insufficient documentation

## 2014-05-16 DIAGNOSIS — Z880 Allergy status to penicillin: Secondary | ICD-10-CM | POA: Diagnosis not present

## 2014-05-16 DIAGNOSIS — Z7902 Long term (current) use of antithrombotics/antiplatelets: Secondary | ICD-10-CM | POA: Insufficient documentation

## 2014-05-16 DIAGNOSIS — Z8619 Personal history of other infectious and parasitic diseases: Secondary | ICD-10-CM | POA: Diagnosis not present

## 2014-05-16 DIAGNOSIS — R0602 Shortness of breath: Secondary | ICD-10-CM | POA: Insufficient documentation

## 2014-05-16 DIAGNOSIS — Z87891 Personal history of nicotine dependence: Secondary | ICD-10-CM | POA: Diagnosis not present

## 2014-05-16 DIAGNOSIS — M1710 Unilateral primary osteoarthritis, unspecified knee: Secondary | ICD-10-CM | POA: Diagnosis not present

## 2014-05-16 DIAGNOSIS — K219 Gastro-esophageal reflux disease without esophagitis: Secondary | ICD-10-CM | POA: Insufficient documentation

## 2014-05-16 DIAGNOSIS — Z8701 Personal history of pneumonia (recurrent): Secondary | ICD-10-CM | POA: Diagnosis not present

## 2014-05-16 DIAGNOSIS — I2510 Atherosclerotic heart disease of native coronary artery without angina pectoris: Secondary | ICD-10-CM | POA: Diagnosis not present

## 2014-05-16 DIAGNOSIS — IMO0002 Reserved for concepts with insufficient information to code with codable children: Secondary | ICD-10-CM | POA: Diagnosis not present

## 2014-05-16 DIAGNOSIS — Z862 Personal history of diseases of the blood and blood-forming organs and certain disorders involving the immune mechanism: Secondary | ICD-10-CM | POA: Insufficient documentation

## 2014-05-16 DIAGNOSIS — F411 Generalized anxiety disorder: Secondary | ICD-10-CM | POA: Diagnosis not present

## 2014-05-16 DIAGNOSIS — R0981 Nasal congestion: Secondary | ICD-10-CM

## 2014-05-16 LAB — CBC WITH DIFFERENTIAL/PLATELET
BASOS PCT: 0 % (ref 0–1)
Basophils Absolute: 0 10*3/uL (ref 0.0–0.1)
EOS ABS: 0.2 10*3/uL (ref 0.0–0.7)
EOS PCT: 3 % (ref 0–5)
HCT: 40.8 % (ref 39.0–52.0)
HEMOGLOBIN: 14.4 g/dL (ref 13.0–17.0)
LYMPHS ABS: 2.8 10*3/uL (ref 0.7–4.0)
Lymphocytes Relative: 37 % (ref 12–46)
MCH: 34.6 pg — ABNORMAL HIGH (ref 26.0–34.0)
MCHC: 35.3 g/dL (ref 30.0–36.0)
MCV: 98.1 fL (ref 78.0–100.0)
MONOS PCT: 13 % — AB (ref 3–12)
Monocytes Absolute: 1 10*3/uL (ref 0.1–1.0)
Neutro Abs: 3.5 10*3/uL (ref 1.7–7.7)
Neutrophils Relative %: 47 % (ref 43–77)
PLATELETS: 143 10*3/uL — AB (ref 150–400)
RBC: 4.16 MIL/uL — ABNORMAL LOW (ref 4.22–5.81)
RDW: 13.4 % (ref 11.5–15.5)
WBC: 7.5 10*3/uL (ref 4.0–10.5)

## 2014-05-16 LAB — BASIC METABOLIC PANEL
Anion gap: 10 (ref 5–15)
BUN: 20 mg/dL (ref 6–23)
CALCIUM: 9 mg/dL (ref 8.4–10.5)
CO2: 28 mEq/L (ref 19–32)
CREATININE: 1.02 mg/dL (ref 0.50–1.35)
Chloride: 100 mEq/L (ref 96–112)
GFR calc Af Amer: 75 mL/min — ABNORMAL LOW (ref >90)
GFR calc non Af Amer: 65 mL/min — ABNORMAL LOW (ref >90)
GLUCOSE: 92 mg/dL (ref 70–99)
Potassium: 4.1 mEq/L (ref 3.7–5.3)
Sodium: 138 mEq/L (ref 137–147)

## 2014-05-16 LAB — I-STAT TROPONIN, ED: Troponin i, poc: 0.04 ng/mL (ref 0.00–0.08)

## 2014-05-16 NOTE — ED Notes (Signed)
Main lab notified urinalysis canceled post specimen sent to lab.

## 2014-05-16 NOTE — ED Notes (Signed)
Bed: TR71 Expected date: 05/16/14 Expected time: 5:13 AM Means of arrival: Ambulance Comments: hypertension

## 2014-05-16 NOTE — ED Provider Notes (Signed)
CSN: [id number]     Arrival date & time 05/16/14  0522 History   First MD Initiated Contact with Patient 05/16/14 [phone]     Chief Complaint  Patient presents with  . Shortness of Breath     (Consider location/radiation/quality/duration/timing/severity/associated sxs/prior Treatment) HPI Comments: Patient is an 80 year old male with a past medical history of hypertension, NSTEMI, CAD, osteoarthritis, prostate cancer, GERD, and diverticulosis who presents with difficulty breathing out of his left nare. The symptoms started in the middle of the night and woke him from his sleep. Patient reports having a deviated septum from a horse accident that occurred "years ago" and has trouble breathing from his left nare from time to time. Patient is usually able to improve the symptom with saline but this did not work earlier. Patient reports the last time this happened, his blood pressure was elevated so he decided to check it and reports it was elevated. He cannot recall the exact number. Patient is asymptomatic at this time. He denies ever having abdominal pain, chest pain, SOB, fever, cough, dysuria.    Past Medical History  Diagnosis Date  . Hypertension   . Degenerative disc disease   . Degenerative joint disease   . Genital herpes   . Diverticulitis   . Prostate cancer 2012    mets to bone  . Lumbosacral spondylolysis   . Hip arthritis 09/29/2013    Bilateral  . Pneumonia 11/24/13  . Other and unspecified hyperlipidemia   . Malignant neoplasm of prostate   . GERD (gastroesophageal reflux disease)   . Cervicalgia   . Anxiety state, unspecified   . Diverticulosis   . TIA (transient ischemic attack)   . Lumbago   . Carbon monoxide exposure 11/24/13  . Hypoxia 11/24/13  . Hyponatremia 11/24/13  . NSTEMI (non-ST elevated myocardial infarction) 11/20/13  . CAD (coronary artery disease) 11/20/13  . Genital herpes 07/10/2013  . Insomnia 07/09/2013  . Postoperative anemia due to acute blood loss  06/22/2013  . OA (osteoarthritis) of knee 06/21/2013  . Lumbosacral spondylosis without myelopathy 09/29/2013   Past Surgical History  Procedure Laterality Date  . Total knee arthroplasty Right 06/21/2013    Procedure: RIGHT TOTAL KNEE ARTHROPLASTY;  Surgeon: Gearlean Alf, MD;  Location: [facility];  Service: Orthopedics;  Laterality: Right;. Dr. Wynelle Link  . Mastoidectomy Bilateral   . Cataract extraction Bilateral   . Tonsillectomy     Family History  Problem Relation Age of Onset  . Heart disease Mother    History  Substance Use Topics  . Smoking status: Former Smoker -- 2.00 packs/day for 15 years    Quit date: 01/14/1984  . Smokeless tobacco: Never Used  . Alcohol Use: 1.2 oz/week    2 Shots of liquor per week     Comment: occasional    Review of Systems  HENT:       Difficulty breathing out of left nare      Allergies  Penicillins  Home Medications   Prior to Admission medications   Medication Sig Start Date End Date Taking? Authorizing Provider  acyclovir (ZOVIRAX) 200 MG capsule Take 200 mg by mouth daily.  08/27/11  Yes Historical Provider, MD  amLODipine (NORVASC) 5 MG tablet Take 5 mg by mouth daily.   Yes Historical Provider, MD  aspirin EC 81 MG EC tablet Take 1 tablet (81 mg total) by mouth daily. 11/24/13  Yes Orson Eva, MD  atorvastatin (LIPITOR) 10 MG tablet Take 5 mg by  mouth every Monday, Wednesday, and Friday.  04/23/12  Yes Historical Provider, MD  B Complex-Biotin-FA (SUPER B-100 PO) Take 1 tablet by mouth daily.   Yes Historical Provider, MD  bicalutamide (CASODEX) 50 MG tablet Take 50 mg by mouth daily. 09/16/13  Yes Historical Provider, MD  clopidogrel (PLAVIX) 75 MG tablet Take 75 mg by mouth daily.   Yes Historical Provider, MD  denosumab (XGEVA) 120 MG/1.7ML SOLN injection Inject 120 mg into the skin every 30 (thirty) days.   Yes Historical Provider, MD  lisinopril (PRINIVIL,ZESTRIL) 40 MG tablet Take 40 mg by mouth daily.   Yes Historical Provider, MD   leuprolide (LUPRON) 30 MG injection Inject 30 mg into the muscle every 4 (four) months.    Historical Provider, MD  LORazepam (ATIVAN) 0.5 MG tablet Take 1 tablet (0.5 mg total) by mouth at bedtime as needed for sleep. 11/29/13   Hosie Poisson, MD   BP 136/70  Pulse 53  Temp(Src) 97.9 F (36.6 C) (Oral)  Resp 18  SpO2 98% Physical Exam  Nursing note and vitals reviewed. Constitutional: He is oriented to person, place, and time. He appears well-developed and well-nourished. No distress.  HENT:  Head: Normocephalic and atraumatic.  Nose: Nose normal.  Mouth/Throat: Oropharynx is clear and moist. No oropharyngeal exudate.  Eyes: Conjunctivae and EOM are normal. Pupils are equal, round, and reactive to light.  Neck: Normal range of motion.  Cardiovascular: Normal rate and regular rhythm.  Exam reveals no gallop and no friction rub.   No murmur heard. Pulmonary/Chest: Effort normal and breath sounds normal. He has no wheezes. He has no rales. He exhibits no tenderness.  Abdominal: Soft. He exhibits no distension. There is no tenderness. There is no rebound and no guarding.  Musculoskeletal: Normal range of motion.  Neurological: He is alert and oriented to person, place, and time. Coordination normal.  Speech is goal-oriented. Moves limbs without ataxia.   Skin: Skin is warm and dry.  Psychiatric: He has a normal mood and affect. His behavior is normal.    ED Course  Procedures (including critical care time) Labs Review Labs Reviewed  CBC WITH DIFFERENTIAL - Abnormal; Notable for the following:    RBC 4.16 (*)    MCH 34.6 (*)    Platelets 143 (*)    Monocytes Relative 13 (*)    All other components within normal limits  BASIC METABOLIC PANEL - Abnormal; Notable for the following:    GFR calc non Af Amer 65 (*)    GFR calc Af Amer 75 (*)    All other components within normal limits  Randolm Idol, ED    Imaging Review Dg Chest 2 View  05/16/2014   CLINICAL DATA:  Shortness  of breath.  EXAM: CHEST  2 VIEW  COMPARISON:  11/28/2013  FINDINGS: Interval clearing of left lower lobe pneumonia. Blunting of the lateral left costophrenic sulcus which is likely scarring. There is no edema, consolidation, effusion, or pneumothorax. Calcified granuloma in the left lower lobe.  Normal heart size and upper mediastinal contours.  No acute osseous findings.  IMPRESSION: COPD without acute superimposed process.   Electronically Signed   By: Jorje Guild M.D.   On: 05/16/2014 06:30     EKG Interpretation   Date/Time:  Monday May 16 2014 06:12:18 EDT Ventricular Rate:  50 PR Interval:  162 QRS Duration: 80 QT Interval:  495 QTC Calculation: 451 R Axis:   88 Text Interpretation:  Sinus rhythm Atrial premature complexes in  couplets  Borderline right axis deviation Borderline low voltage, extremity leads  Minimal ST elevation, inferior leads PVC noted previously Confirmed by  MOLPUS  MD, Jenny Reichmann ([id number]) on 05/16/2014 6:18:25 AM      MDM   Final diagnoses:  Nasal congestion    6:52 AM Patient is an 80 year old male who presents with difficulty breathing out of one of his nostrils in the middle of the night. Patient reports last time this happened, his blood pressure was elevated. He reports it was elevated tonight as well and called EMS. This is a common occurrence with this patient as he had a previous horse accident, resulting in a deviated nasal septum. Vitals stable and patient afebrile. Patient denies chest pain, abdominal pain, difficulty breathing at this time. He appears non toxic. His labs are unremarkable as well as his chest xray. Patient reports he will follow up with his PCP. No further evaluation needed at this time.     Alvina Chou, PA-C 05/16/14 1448

## 2014-05-16 NOTE — ED Notes (Signed)
Pt denies pain, SOB, or CP. Pt comfortable reading newspaper. Pt denies needs.

## 2014-05-16 NOTE — ED Provider Notes (Signed)
Medical screening examination/treatment/procedure(s) were conducted as a shared visit with non-physician practitioner(s) and myself.  I personally evaluated the patient during the encounter.   EKG Interpretation   Date/Time:  Monday May 16 2014 06:12:18 EDT Ventricular Rate:  50 PR Interval:  162 QRS Duration: 80 QT Interval:  495 QTC Calculation: 451 R Axis:   88 Text Interpretation:  Sinus rhythm Atrial premature complexes in couplets  Borderline right axis deviation Borderline low voltage, extremity leads  Minimal ST elevation, inferior leads PVC noted previously Confirmed by  Elianny Buxbaum  MD, Jenny Reichmann ([id number]) on 05/16/2014 6:18:25 AM      7:51 AM Patient in no respiratory distress. He became anxious this morning when his nose became occluded on the left. He noted his blood pressure to be elevated which is subsequently resolved. He is without complaint at the present time.  Wynetta Fines, MD 05/16/14 [phone]

## 2014-05-16 NOTE — ED Notes (Signed)
Per EMS pt got up to use the bathroom and noticed he was having trouble breathing out of one nare d/t deviated septum.  Pt applied home O2 prior to EMS arrival and reported to them that it had helped some w/ his SOB.  Per EMS pt had visible tremors upon arrival to pt's residence however tremors subsided after speaking w/ EMS and calming down some.

## 2014-05-16 NOTE — Discharge Instructions (Signed)
Follow up with your doctor for further evaluation. Return to the ED with worsening or concerning symptoms.  °

## 2014-05-17 ENCOUNTER — Ambulatory Visit (INDEPENDENT_AMBULATORY_CARE_PROVIDER_SITE_OTHER): Payer: Medicare Other | Admitting: Nurse Practitioner

## 2014-05-17 ENCOUNTER — Encounter: Payer: Self-pay | Admitting: Nurse Practitioner

## 2014-05-17 VITALS — BP 108/80 | HR 78 | Ht 68.0 in | Wt 162.0 lb

## 2014-05-17 DIAGNOSIS — E785 Hyperlipidemia, unspecified: Secondary | ICD-10-CM

## 2014-05-17 DIAGNOSIS — R0609 Other forms of dyspnea: Secondary | ICD-10-CM

## 2014-05-17 DIAGNOSIS — I10 Essential (primary) hypertension: Secondary | ICD-10-CM

## 2014-05-17 DIAGNOSIS — R0989 Other specified symptoms and signs involving the circulatory and respiratory systems: Secondary | ICD-10-CM

## 2014-05-17 DIAGNOSIS — R0600 Dyspnea, unspecified: Secondary | ICD-10-CM

## 2014-05-17 LAB — CK TOTAL AND CKMB (NOT AT ARMC)
CK, MB: 3.1 ng/mL (ref 0.0–5.0)
Total CK: 56 U/L (ref 7–232)

## 2014-05-17 LAB — TROPONIN I: Troponin I: <0.3 ng/mL (ref <0.06)

## 2014-05-17 NOTE — Patient Instructions (Addendum)
Stay on your current medicines but do not use the Nifedipine and do not use any more Aleve.  Monitor your blood pressure at home. If your systolic BP is over 790 - take an extra dose of Norvasc (amlodipine)  Try to cut back on your salt  We are rechecking your cardiac enzymes today  See Dr. Ron Parker in one month  Call the [place] office at [phone] if you have any questions, problems or concerns.

## 2014-05-17 NOTE — Progress Notes (Signed)
Craig [name] Date of Birth: 08-11-1926 Medical Record #[id number]  History of Present Illness: Mr. Craig [name] is seen back today for a post ER visit - seen for Dr. Ron Parker. He is an 80 year old male with HTN, presumed CAD with past NSTEMI from March of 2015 - negative Myoview, OA, prostate cancer, GERD and HLD. Has had prior deviated septum.   Last seen by Dr. Ron Parker in March of 2015 as a consult in the hospital - had had some shortness of breath and a cough - slightly elevated troponin - felt to be due to demand ischemia in the setting of pneumonia. Myoview normal at that time - no further evaluation felt to be needed. He was treated for pneumonia.  In the ER this past Friday night and again yesterday. He notes that for the past week or so, BP was trending up. Was having back pain. Started using Aleve for his back pain. On Friday night, he woke up about 12:30 am and did not feel well - checked his BP and it was over 078 systolic. He took an extra Plavix and an extra dose of his Lisinopril. Called EMS - by the time he got to the ER his BP was fine. Had 2 POC troponin's that were mildly elevated. Sent home and asked to follow up here I presume. Went back to the ER yesterday because he had difficulty breathing out of his left nostril. Thought his BP was elevated but his vitals were stable. His evaluation was benign. EKG showing sinus with some PVCs. Thus added to my schedule for today.   Comes in today. Here with wife and daughter. Not clear as to why he is here in our office. We reviewed his history from the weekend. He has had no spells of chest pain. Was doing fine up until the last week or so. Uses probably too much salt. Taking the Aleve as well. BP was trending up. Back down today. No chest pain whatsoever. Limited by his back.   Current Outpatient Prescriptions  Medication Sig Dispense Refill  . acyclovir (ZOVIRAX) 200 MG capsule Take 200 mg by mouth daily.       Marland Kitchen amLODipine (NORVASC) 5 MG tablet  Take 5 mg by mouth daily.      Marland Kitchen aspirin EC 81 MG EC tablet Take 1 tablet (81 mg total) by mouth daily.  30 tablet  0  . atorvastatin (LIPITOR) 10 MG tablet Take 5 mg by mouth every Monday, Wednesday, and Friday.       . B Complex-Biotin-FA (SUPER B-100 PO) Take 1 tablet by mouth daily.      . bicalutamide (CASODEX) 50 MG tablet Take 50 mg by mouth daily.      . clopidogrel (PLAVIX) 75 MG tablet Take 75 mg by mouth daily.      Marland Kitchen denosumab (XGEVA) 120 MG/1.7ML SOLN injection Inject 120 mg into the skin every 30 (thirty) days.      Marland Kitchen leuprolide (LUPRON) 30 MG injection Inject 30 mg into the muscle every 4 (four) months.      Marland Kitchen lisinopril (PRINIVIL,ZESTRIL) 40 MG tablet Take 40 mg by mouth daily.      Marland Kitchen LORazepam (ATIVAN) 0.5 MG tablet Take 1 tablet (0.5 mg total) by mouth at bedtime as needed for sleep.  10 tablet  0   No current facility-administered medications for this visit.    Allergies  Allergen Reactions  . Penicillins Other (See Comments)    unknown  Past Medical History  Diagnosis Date  . Hypertension   . Degenerative disc disease   . Degenerative joint disease   . Genital herpes   . Diverticulitis   . Prostate cancer 2012    mets to bone  . Lumbosacral spondylolysis   . Hip arthritis 09/29/2013    Bilateral  . Pneumonia 11/24/13  . Other and unspecified hyperlipidemia   . Malignant neoplasm of prostate   . GERD (gastroesophageal reflux disease)   . Cervicalgia   . Anxiety state, unspecified   . Diverticulosis   . TIA (transient ischemic attack)   . Lumbago   . Carbon monoxide exposure 11/24/13  . Hypoxia 11/24/13  . Hyponatremia 11/24/13  . NSTEMI (non-ST elevated myocardial infarction) 11/20/13  . CAD (coronary artery disease) 11/20/13  . Genital herpes 07/10/2013  . Insomnia 07/09/2013  . Postoperative anemia due to acute blood loss 06/22/2013  . OA (osteoarthritis) of knee 06/21/2013  . Lumbosacral spondylosis without myelopathy 09/29/2013    Past Surgical History    Procedure Laterality Date  . Total knee arthroplasty Right 06/21/2013    Procedure: RIGHT TOTAL KNEE ARTHROPLASTY;  Surgeon: Gearlean Alf, MD;  Location: [facility];  Service: Orthopedics;  Laterality: Right;. Dr. Wynelle Link  . Mastoidectomy Bilateral   . Cataract extraction Bilateral   . Tonsillectomy      History  Smoking status  . Former Smoker -- 2.00 packs/day for 15 years  . Quit date: 01/14/1984  Smokeless tobacco  . Never Used    History  Alcohol Use  . 1.2 oz/week  . 2 Shots of liquor per week    Comment: occasional    Family History  Problem Relation Age of Onset  . Heart disease Mother     Review of Systems: The review of systems is per the HPI.  All other systems were reviewed and are negative.  Physical Exam: BP 108/80  Pulse 78  Ht 5\' 8"  (1.727 m)  Wt 162 lb (73.483 kg)  BMI 24.64 kg/m2  SpO2 99% Patient is very pleasant and in no acute distress. Little hard of hearing. Skin is warm and dry. Color is normal.  HEENT is unremarkable. Normocephalic/atraumatic. PERRL. Sclera are nonicteric. Neck is supple. No masses. No JVD. Lungs are clear. Cardiac exam shows a regular rate and rhythm. Occasional ectopic. Abdomen is soft. Extremities are without edema. Gait and ROM are intact. No gross neurologic deficits noted.  Wt Readings from Last 3 Encounters:  05/17/14 162 lb (73.483 kg)  05/14/14 155 lb (70.308 kg)  12/03/13 155 lb (70.308 kg)    LABORATORY DATA/PROCEDURES:  Lab Results  Component Value Date   WBC 7.5 05/16/2014   HGB 14.4 05/16/2014   HCT 40.8 05/16/2014   PLT 143* 05/16/2014   GLUCOSE 92 05/16/2014   CHOL 132 11/22/2013   TRIG 55 11/22/2013   HDL 77 11/22/2013   LDLCALC 44 11/22/2013   ALT 29 12/06/2013   AST 23 12/06/2013   NA 138 05/16/2014   K 4.1 05/16/2014   CL 100 05/16/2014   CREATININE 1.02 05/16/2014   BUN 20 05/16/2014   CO2 28 05/16/2014   TSH 0.840 06/25/2013   PSA 0.23 03/16/2013   INR 1.20 11/23/2013   HGBA1C 5.5 06/24/2013    BNP  (last 3 results)  Recent Labs  06/25/13 0805 11/21/13 0137  PROBNP 513.9* 656.8*   Echo Study Conclusions from March 2015  Left ventricle: The cavity size was normal. Wall thickness was normal. Systolic function  was normal. The estimated ejection fraction was in the range of 60% to 65%. Wall motion was normal; there were no regional wall motion abnormalities. Left ventricular diastolic function parameters were normal.  Impressions:  - No cardiac source of emboli was indentified.  MYOCARDIAL IMAGING WITH SPECT (REST AND PHARMACOLOGIC-STRESS) M GATED LEFT VENTRICULAR WALL MOTION STUDY  LEFT VENTRICULAR EJECTION FRACTION  TECHNIQUE:  Standard myocardial SPECT imaging was performed after resting  intravenous injection of 10 mCi Tc-1m sestamibi. Subsequently,  intravenous infusion of Lexiscan was performed under the supervision  of the Cardiology staff. At peak effect of the drug, 30 mCi Tc-45m  sestamibi was injected intravenously and standard myocardial SPECT  imaging was performed. Quantitative gated imaging was also performed  to evaluate left ventricular wall motion, and estimate left  ventricular ejection fraction.  COMPARISON: None.  FINDINGS:  Decreased uptake along the inferior wall on both the rest and stress  images. This finding could be related to diaphragmatic attenuation.  There may be gut uptake on the stress images. There is no evidence  for a reversible defect. The left ventricle wall motion is within  normal limits, including the inferior wall. Calculated ejection  fraction is 57%. End-diastolic volume is 81 ml and end systolic  volume is 35 ml.  IMPRESSION:  No evidence for pharmacological induced ischemia.  Calculated ejection fraction is 57%.  Probable diaphragmatic attenuation.  Electronically Signed  By: Markus Daft M.D.  On: 11/23/2013 15:10    Assessment / Plan: 1. Recent episode of elevated BP - most likely from Aleve and excessive salt - BP  low normal today. I have asked him to stop the Aleve. Do not use the Nifedipine prn but take an extra dose of Norvasc if BP over 170 consistently. Try to cut back on salt.    2. HTN - his BP is fine today - he will continue to monitor.   3. Elevated troponin -  Last check from yesterday was negative. Have discussed his case with Dr. Mare Ferrari (DOD) here today - will recheck cardiac enzymes. Repeat EKG unchanged. Recent negative Myoview. See back in a month.   Patient is agreeable to this plan and will call if any problems develop in the interim.   Burtis Junes, RN, [facility] [address] [place], [place]  [address] [phone]

## 2014-05-19 ENCOUNTER — Encounter: Payer: Medicare Other | Admitting: Cardiology

## 2014-05-23 ENCOUNTER — Telehealth: Payer: Self-pay | Admitting: Cardiology

## 2014-05-23 NOTE — Telephone Encounter (Signed)
LMTCB

## 2014-05-23 NOTE — Telephone Encounter (Signed)
Follow up:    Pt would like a call back about his lab results please.

## 2014-06-20 ENCOUNTER — Encounter: Payer: Self-pay | Admitting: Cardiology

## 2014-06-22 ENCOUNTER — Ambulatory Visit: Payer: Medicare Other | Admitting: Cardiology

## 2014-07-01 ENCOUNTER — Telehealth: Payer: Self-pay | Admitting: Cardiology

## 2014-07-01 NOTE — Telephone Encounter (Signed)
The pt is given his lab results from 05/17/14. He verbalized understanding.

## 2014-07-01 NOTE — Telephone Encounter (Signed)
Follow Up        Pt returning phone call in regards to some lab results. Please call pt back and advise.

## 2014-07-04 ENCOUNTER — Encounter: Payer: Self-pay | Admitting: Cardiology

## 2014-07-04 ENCOUNTER — Ambulatory Visit (INDEPENDENT_AMBULATORY_CARE_PROVIDER_SITE_OTHER): Payer: Medicare Other | Admitting: Cardiology

## 2014-07-04 VITALS — BP 140/78 | HR 58 | Ht 68.0 in | Wt 164.8 lb

## 2014-07-04 DIAGNOSIS — E785 Hyperlipidemia, unspecified: Secondary | ICD-10-CM

## 2014-07-04 DIAGNOSIS — R0989 Other specified symptoms and signs involving the circulatory and respiratory systems: Secondary | ICD-10-CM

## 2014-07-04 DIAGNOSIS — I10 Essential (primary) hypertension: Secondary | ICD-10-CM

## 2014-07-04 DIAGNOSIS — R9430 Abnormal result of cardiovascular function study, unspecified: Secondary | ICD-10-CM | POA: Insufficient documentation

## 2014-07-04 NOTE — Assessment & Plan Note (Signed)
His lipids are being treated. No change in therapy.   

## 2014-07-04 NOTE — Patient Instructions (Signed)
**Note De-Identified Morgyn Marut Obfuscation** Your physician recommends that you continue on your current medications as directed. Please refer to the Current Medication list given to you today.  Your physician recommends that you schedule a follow-up appointment in: none needed. Please follow up with your primary care physician

## 2014-07-04 NOTE — Progress Notes (Signed)
Patient ID: Craig [name], male   DOB: [birth year], 80 y.o.   MRN: [id number]    HPI  this very pleasant active intelligent 80 year old gentleman is seen for cardiology follow-up. I had seen him in the hospital with a troponin elevation. His nuclear scan revealed no significant abnormalities. This occurred when he was admitted with pneumonia. He has been stable since. He saw Truitt Merle, NP in the office on May 17, 2014. There is an excellent summary note from that visit. He is actually doing very well. He does not have any significant symptoms. There was question of fluctuation in his blood pressure but it is actually been stable.  Allergies  Allergen Reactions  . Penicillins Other (See Comments)    unknown    Current Outpatient Prescriptions  Medication Sig Dispense Refill  . acyclovir (ZOVIRAX) 200 MG capsule Take 200 mg by mouth daily.     Marland Kitchen amLODipine (NORVASC) 5 MG tablet Take 5 mg by mouth daily.    Marland Kitchen aspirin EC 81 MG EC tablet Take 1 tablet (81 mg total) by mouth daily. 30 tablet 0  . atorvastatin (LIPITOR) 10 MG tablet Take 5 mg by mouth every Monday, Wednesday, and Friday.     . B Complex-Biotin-FA (SUPER B-100 PO) Take 1 tablet by mouth daily.    . bicalutamide (CASODEX) 50 MG tablet Take 50 mg by mouth daily.    . clopidogrel (PLAVIX) 75 MG tablet Take 75 mg by mouth daily.    Marland Kitchen denosumab (XGEVA) 120 MG/1.7ML SOLN injection Inject 120 mg into the skin every 30 (thirty) days.    Marland Kitchen leuprolide (LUPRON) 30 MG injection Inject 30 mg into the muscle every 4 (four) months.    Marland Kitchen lisinopril (PRINIVIL,ZESTRIL) 40 MG tablet Take 40 mg by mouth daily.    Marland Kitchen LORazepam (ATIVAN) 0.5 MG tablet Take 1 tablet (0.5 mg total) by mouth at bedtime as needed for sleep. 10 tablet 0   No current facility-administered medications for this visit.    History   Social History  . Marital Status: Divorced    Spouse Name: N/A    Number of Children: N/A  . Years of Education: N/A    Occupational History  . Not on file.   Social History Main Topics  . Smoking status: Former Smoker -- 2.00 packs/day for 15 years    Quit date: 01/14/1984  . Smokeless tobacco: Never Used  . Alcohol Use: 1.2 oz/week    2 Shots of liquor per week     Comment: occasional  . Drug Use: Not on file  . Sexual Activity: No   Other Topics Concern  . Not on file   Social History Narrative    Family History  Problem Relation Age of Onset  . Heart disease Mother     Past Medical History  Diagnosis Date  . Hypertension   . Degenerative disc disease   . Degenerative joint disease   . Genital herpes   . Diverticulitis   . Prostate cancer 2012    mets to bone  . Lumbosacral spondylolysis   . Hip arthritis 09/29/2013    Bilateral  . Pneumonia 11/24/13  . Other and unspecified hyperlipidemia   . Malignant neoplasm of prostate   . GERD (gastroesophageal reflux disease)   . Cervicalgia   . Anxiety state, unspecified   . Diverticulosis   . TIA (transient ischemic attack)   . Lumbago   . Carbon monoxide exposure 11/24/13  . Hypoxia 11/24/13  .  Hyponatremia 11/24/13  . NSTEMI (non-ST elevated myocardial infarction) 11/20/13  . CAD (coronary artery disease) 11/20/13  . Genital herpes 07/10/2013  . Insomnia 07/09/2013  . Postoperative anemia due to acute blood loss 06/22/2013  . OA (osteoarthritis) of knee 06/21/2013  . Lumbosacral spondylosis without myelopathy 09/29/2013  . Ejection fraction     Past Surgical History  Procedure Laterality Date  . Total knee arthroplasty Right 06/21/2013    Procedure: RIGHT TOTAL KNEE ARTHROPLASTY;  Surgeon: Gearlean Alf, MD;  Location: [facility];  Service: Orthopedics;  Laterality: Right;. Dr. Wynelle Link  . Mastoidectomy Bilateral   . Cataract extraction Bilateral   . Tonsillectomy      Patient Active Problem List   Diagnosis Date Noted  . Ejection fraction   . HCAP (healthcare-associated pneumonia) 11/27/2013  . Hypoxia 11/25/2013  . Carbon  monoxide exposure 11/25/2013  . Carboxyhemoglobinemia 11/25/2013  . NSTEMI (non-ST elevated myocardial infarction) 11/21/2013  . CAP (community acquired pneumonia) 11/21/2013  . Hyperlipidemia 11/21/2013  . Lumbosacral spondylosis without myelopathy 09/29/2013  . Hip arthritis 09/29/2013  . GERD (gastroesophageal reflux disease) 07/13/2013  . Genital herpes 07/10/2013  . Essential hypertension 07/10/2013  . Insomnia 07/09/2013  . TIA (transient ischemic attack) 06/24/2013  . Postoperative anemia due to acute blood loss 06/22/2013  . Hyponatremia 06/22/2013  . OA (osteoarthritis) of knee 06/21/2013  . Prostate cancer 04/14/2012    ROS  patient denies fever, chills, headache, sweats, rash, change in vision, change in hearing, chest pain, cough, nausea or vomiting, urinary symptoms. All other systems are reviewed and are negative.  PHYSICAL EXAM  patient is oriented to person time and place. Affect is normal. Head is atraumatic. Sclera and conjunctiva are normal. There is no jugular venous distention. Lungs are clear. Respiratory effort is unlabored. Cardiac exam reveals an S1 and S2. There are no clicks or significant murmurs. Abdomen is soft. There is no peripheral edema.  Filed Vitals:   07/04/14 1051  BP: 140/78  Pulse: 58  Height: 5\' 8"  (1.727 m)  Weight: 164 lb 12.8 oz (74.753 kg)  SpO2: 99%      ASSESSMENT & PLAN

## 2014-07-04 NOTE — Assessment & Plan Note (Signed)
Patient is completely stable. He had some fluctuation of the blood pressure related to nonsteroidal anti-inflammatory medicines. His pressure stable. No further workup.

## 2014-07-05 ENCOUNTER — Ambulatory Visit: Payer: Medicare Other | Admitting: Cardiology

## 2014-07-09 ENCOUNTER — Encounter (HOSPITAL_COMMUNITY): Payer: Self-pay | Admitting: Emergency Medicine

## 2014-07-09 ENCOUNTER — Emergency Department (HOSPITAL_COMMUNITY)
Admission: EM | Admit: 2014-07-09 | Discharge: 2014-07-09 | Disposition: A | Discharge status: Home / Self Care | Payer: Medicare Other | Attending: Emergency Medicine | Admitting: Emergency Medicine

## 2014-07-09 DIAGNOSIS — Z7902 Long term (current) use of antithrombotics/antiplatelets: Secondary | ICD-10-CM | POA: Insufficient documentation

## 2014-07-09 DIAGNOSIS — M1990 Unspecified osteoarthritis, unspecified site: Secondary | ICD-10-CM | POA: Insufficient documentation

## 2014-07-09 DIAGNOSIS — Z7982 Long term (current) use of aspirin: Secondary | ICD-10-CM | POA: Insufficient documentation

## 2014-07-09 DIAGNOSIS — F419 Anxiety disorder, unspecified: Secondary | ICD-10-CM | POA: Insufficient documentation

## 2014-07-09 DIAGNOSIS — I10 Essential (primary) hypertension: Secondary | ICD-10-CM | POA: Diagnosis not present

## 2014-07-09 DIAGNOSIS — Z8673 Personal history of transient ischemic attack (TIA), and cerebral infarction without residual deficits: Secondary | ICD-10-CM | POA: Insufficient documentation

## 2014-07-09 DIAGNOSIS — Z8546 Personal history of malignant neoplasm of prostate: Secondary | ICD-10-CM | POA: Diagnosis not present

## 2014-07-09 DIAGNOSIS — Z791 Long term (current) use of non-steroidal anti-inflammatories (NSAID): Secondary | ICD-10-CM | POA: Insufficient documentation

## 2014-07-09 DIAGNOSIS — Z79899 Other long term (current) drug therapy: Secondary | ICD-10-CM | POA: Diagnosis not present

## 2014-07-09 DIAGNOSIS — I252 Old myocardial infarction: Secondary | ICD-10-CM | POA: Diagnosis not present

## 2014-07-09 DIAGNOSIS — Z880 Allergy status to penicillin: Secondary | ICD-10-CM | POA: Insufficient documentation

## 2014-07-09 DIAGNOSIS — Z720 Tobacco use: Secondary | ICD-10-CM | POA: Diagnosis not present

## 2014-07-09 DIAGNOSIS — K219 Gastro-esophageal reflux disease without esophagitis: Secondary | ICD-10-CM | POA: Diagnosis not present

## 2014-07-09 DIAGNOSIS — I2510 Atherosclerotic heart disease of native coronary artery without angina pectoris: Secondary | ICD-10-CM | POA: Diagnosis not present

## 2014-07-09 DIAGNOSIS — Z8701 Personal history of pneumonia (recurrent): Secondary | ICD-10-CM | POA: Diagnosis not present

## 2014-07-09 DIAGNOSIS — Z8619 Personal history of other infectious and parasitic diseases: Secondary | ICD-10-CM | POA: Insufficient documentation

## 2014-07-09 DIAGNOSIS — E785 Hyperlipidemia, unspecified: Secondary | ICD-10-CM | POA: Insufficient documentation

## 2014-07-09 LAB — CBC WITH DIFFERENTIAL/PLATELET
BASOS PCT: 0 % (ref 0–1)
Basophils Absolute: 0 10*3/uL (ref 0.0–0.1)
Eosinophils Absolute: 0.1 10*3/uL (ref 0.0–0.7)
Eosinophils Relative: 2 % (ref 0–5)
HCT: 41.6 % (ref 39.0–52.0)
Hemoglobin: 14.7 g/dL (ref 13.0–17.0)
LYMPHS PCT: 24 % (ref 12–46)
Lymphs Abs: 1.8 10*3/uL (ref 0.7–4.0)
MCH: 34.3 pg — ABNORMAL HIGH (ref 26.0–34.0)
MCHC: 35.3 g/dL (ref 30.0–36.0)
MCV: 97 fL (ref 78.0–100.0)
MONOS PCT: 9 % (ref 3–12)
Monocytes Absolute: 0.7 10*3/uL (ref 0.1–1.0)
NEUTROS ABS: 4.7 10*3/uL (ref 1.7–7.7)
NEUTROS PCT: 65 % (ref 43–77)
Platelets: 183 10*3/uL (ref 150–400)
RBC: 4.29 MIL/uL (ref 4.22–5.81)
RDW: 12.6 % (ref 11.5–15.5)
WBC: 7.3 10*3/uL (ref 4.0–10.5)

## 2014-07-09 LAB — URINALYSIS, ROUTINE W REFLEX MICROSCOPIC
Bilirubin Urine: NEGATIVE
Glucose, UA: NEGATIVE mg/dL
HGB URINE DIPSTICK: NEGATIVE
Ketones, ur: NEGATIVE mg/dL
Leukocytes, UA: NEGATIVE
NITRITE: NEGATIVE
Protein, ur: 30 mg/dL — AB
SPECIFIC GRAVITY, URINE: 1.011 (ref 1.005–1.030)
UROBILINOGEN UA: 0.2 mg/dL (ref 0.0–1.0)
pH: 7 (ref 5.0–8.0)

## 2014-07-09 LAB — BASIC METABOLIC PANEL
Anion gap: 14 (ref 5–15)
BUN: 19 mg/dL (ref 6–23)
CHLORIDE: 99 meq/L (ref 96–112)
CO2: 27 mEq/L (ref 19–32)
Calcium: 10.6 mg/dL — ABNORMAL HIGH (ref 8.4–10.5)
Creatinine, Ser: 0.89 mg/dL (ref 0.50–1.35)
GFR calc Af Amer: 87 mL/min — ABNORMAL LOW (ref >90)
GFR calc non Af Amer: 75 mL/min — ABNORMAL LOW (ref >90)
Glucose, Bld: 100 mg/dL — ABNORMAL HIGH (ref 70–99)
POTASSIUM: 4.1 meq/L (ref 3.7–5.3)
Sodium: 140 mEq/L (ref 137–147)

## 2014-07-09 LAB — URINE MICROSCOPIC-ADD ON

## 2014-07-09 LAB — I-STAT TROPONIN, ED: TROPONIN I, POC: 0.01 ng/mL (ref 0.00–0.08)

## 2014-07-09 NOTE — ED Notes (Signed)
Urine at bedside. 

## 2014-07-09 NOTE — ED Notes (Signed)
Family at bedside. 

## 2014-07-09 NOTE — Discharge Instructions (Signed)
Follow-up with your doctor. Return to the ER if you develop any blurred vision, dizziness, weakness, chest pain, palpitations, shortness of breath, nausea, vomiting, trouble urinating.  Hypertension Hypertension, commonly called high blood pressure, is when the force of blood pumping through your arteries is too strong. Your arteries are the blood vessels that carry blood from your heart throughout your body. A blood pressure reading consists of a higher number over a lower number, such as 110/72. The higher number (systolic) is the pressure inside your arteries when your heart pumps. The lower number (diastolic) is the pressure inside your arteries when your heart relaxes. Ideally you want your blood pressure below 120/80. Hypertension forces your heart to work harder to pump blood. Your arteries may become narrow or stiff. Having hypertension puts you at risk for heart disease, stroke, and other problems.  RISK FACTORS Some risk factors for high blood pressure are controllable. Others are not.  Risk factors you cannot control include:   Race. You may be at higher risk if you are African American.  Age. Risk increases with age.  Gender. Men are at higher risk than women before age 68 years. After age 28, women are at higher risk than men. Risk factors you can control include:  Not getting enough exercise or physical activity.  Being overweight.  Getting too much fat, sugar, calories, or salt in your diet.  Drinking too much alcohol. SIGNS AND SYMPTOMS Hypertension does not usually cause signs or symptoms. Extremely high blood pressure (hypertensive crisis) may cause headache, anxiety, shortness of breath, and nosebleed. DIAGNOSIS  To check if you have hypertension, your health care provider will measure your blood pressure while you are seated, with your arm held at the level of your heart. It should be measured at least twice using the same arm. Certain conditions can cause a difference  in blood pressure between your right and left arms. A blood pressure reading that is higher than normal on one occasion does not mean that you need treatment. If one blood pressure reading is high, ask your health care provider about having it checked again. TREATMENT  Treating high blood pressure includes making lifestyle changes and possibly taking medicine. Living a healthy lifestyle can help lower high blood pressure. You may need to change some of your habits. Lifestyle changes may include:  Following the DASH diet. This diet is high in fruits, vegetables, and whole grains. It is low in salt, red meat, and added sugars.  Getting at least 2 hours of brisk physical activity every week.  Losing weight if necessary.  Not smoking.  Limiting alcoholic beverages.  Learning ways to reduce stress. If lifestyle changes are not enough to get your blood pressure under control, your health care provider may prescribe medicine. You may need to take more than one. Work closely with your health care provider to understand the risks and benefits. HOME CARE INSTRUCTIONS  Have your blood pressure rechecked as directed by your health care provider.   Take medicines only as directed by your health care provider. Follow the directions carefully. Blood pressure medicines must be taken as prescribed. The medicine does not work as well when you skip doses. Skipping doses also puts you at risk for problems.   Do not smoke.   Monitor your blood pressure at home as directed by your health care provider. SEEK MEDICAL CARE IF:   You think you are having a reaction to medicines taken.  You have recurrent headaches or feel dizzy.  You have swelling in your ankles.  You have trouble with your vision. SEEK IMMEDIATE MEDICAL CARE IF:  You develop a severe headache or confusion.  You have unusual weakness, numbness, or feel faint.  You have severe chest or abdominal pain.  You vomit  repeatedly.  You have trouble breathing. MAKE SURE YOU:   Understand these instructions.  Will watch your condition.  Will get help right away if you are not doing well or get worse. Document Released: 08/12/2005 Document Revised: 12/27/2013 Document Reviewed: 06/04/2013 [facility] Patient Information 2015 [place], [place]. This information is not intended to replace advice given to you by your health care provider. Make sure you discuss any questions you have with your health care provider.

## 2014-07-09 NOTE — ED Notes (Addendum)
Pt from home via EMS-Per EMS, pt woke up and checked his BP, was found to be higher than normal. Pt wants to be evaluated. Pt has hx of HTN and took BP meds this am. Pt PCP told him that if his BP was over 170 SBP, to take another amlodipine. Pt did so before calling EMS. Pt is A&O and in NAD. Pt denies CP, SOB, pain or other s/sx.  EMS sts 12 lead showed NSR with occasional PVC's

## 2014-07-09 NOTE — ED Notes (Signed)
Pt ambulated to BR and back to room w/o assistance 

## 2014-07-09 NOTE — ED Provider Notes (Signed)
CSN: [id number]     Arrival date & time 07/09/14  0940 History   First MD Initiated Contact with Patient 07/09/14 1003     Chief Complaint  Patient presents with  . Hypertension     (Consider location/radiation/quality/duration/timing/severity/associated sxs/prior Treatment) HPI Mr. Mofield is an 80 year old male with past medical history of hypertension, CAD, MI, who presents the ER for hypertension. Patient reports yesterday he checked his blood pressure and noted his systolic to be in the 536R, and took an extra dose of his prescribed amlodipine as directed by his cardiologist. Patient reports this morning he noticed his systolic blood pressure to be at 200. He states he took an extra amlodipine again, however his systolic blood pressure only went down to the 190s. Patient states this was concerning and he called 911. Patient states last night the only thing unusual from his routine was last night he ate some salty pretzels. Patient denies experiencing any headache, blurred vision, dizziness, weakness, neck pain, chest pain, shortness of breath, palpitations, nausea, vomiting, dysuria. Patient reports his only recent illness was approximately 3 weeks ago for which he saw his primary care physician and was given antibiotics for a respiratory infection. On arrival to the ER by EMS patient's blood pressure was 138/78.  Past Medical History  Diagnosis Date  . Hypertension   . Degenerative disc disease   . Degenerative joint disease   . Genital herpes   . Diverticulitis   . Prostate cancer 2012    mets to bone  . Lumbosacral spondylolysis   . Hip arthritis 09/29/2013    Bilateral  . Pneumonia 11/24/13  . Other and unspecified hyperlipidemia   . Malignant neoplasm of prostate   . GERD (gastroesophageal reflux disease)   . Cervicalgia   . Anxiety state, unspecified   . Diverticulosis   . TIA (transient ischemic attack)   . Lumbago   . Carbon monoxide exposure 11/24/13  . Hypoxia 11/24/13  .  Hyponatremia 11/24/13  . NSTEMI (non-ST elevated myocardial infarction) 11/20/13  . CAD (coronary artery disease) 11/20/13  . Genital herpes 07/10/2013  . Insomnia 07/09/2013  . Postoperative anemia due to acute blood loss 06/22/2013  . OA (osteoarthritis) of knee 06/21/2013  . Lumbosacral spondylosis without myelopathy 09/29/2013  . Ejection fraction    Past Surgical History  Procedure Laterality Date  . Total knee arthroplasty Right 06/21/2013    Procedure: RIGHT TOTAL KNEE ARTHROPLASTY;  Surgeon: Gearlean Alf, MD;  Location: [facility];  Service: Orthopedics;  Laterality: Right;. Dr. Wynelle Link  . Mastoidectomy Bilateral   . Cataract extraction Bilateral   . Tonsillectomy     Family History  Problem Relation Age of Onset  . Heart disease Mother    History  Substance Use Topics  . Smoking status: Former Smoker -- 2.00 packs/day for 15 years    Quit date: 01/14/1984  . Smokeless tobacco: Never Used  . Alcohol Use: 1.2 oz/week    2 Shots of liquor per week     Comment: occasional    Review of Systems  Constitutional: Negative for fever.  HENT: Negative for trouble swallowing.   Eyes: Negative for visual disturbance.  Respiratory: Negative for shortness of breath.   Cardiovascular: Negative for chest pain.  Gastrointestinal: Negative for nausea, vomiting and abdominal pain.  Genitourinary: Negative for dysuria.  Musculoskeletal: Negative for neck pain.  Skin: Negative for rash.  Neurological: Negative for dizziness, weakness and numbness.  Psychiatric/Behavioral: Negative.       Allergies  Penicillins  Home Medications   Prior to Admission medications   Medication Sig Start Date End Date Taking? Authorizing Provider  acyclovir (ZOVIRAX) 200 MG capsule Take 200 mg by mouth daily.  08/27/11  Yes Historical Provider, MD  amLODipine (NORVASC) 5 MG tablet Take 5 mg by mouth daily.   Yes Historical Provider, MD  aspirin EC 81 MG EC tablet Take 1 tablet (81 mg total) by mouth  daily. 11/24/13  Yes Orson Eva, MD  atorvastatin (LIPITOR) 10 MG tablet Take 10 mg by mouth every Monday, Wednesday, and Friday.  04/23/12  Yes Historical Provider, MD  B Complex-C (B-COMPLEX WITH VITAMIN C) tablet Take 1 tablet by mouth daily.   Yes Historical Provider, MD  bicalutamide (CASODEX) 50 MG tablet Take 50 mg by mouth daily. 09/16/13  Yes Historical Provider, MD  Calcium Carbonate-Vitamin D (CALCIUM + D PO) Take 1 tablet by mouth 2 (two) times daily.   Yes Historical Provider, MD  celecoxib (CELEBREX) 200 MG capsule Take 200 mg by mouth daily.   Yes Historical Provider, MD  clopidogrel (PLAVIX) 75 MG tablet Take 75 mg by mouth daily.   Yes Historical Provider, MD  denosumab (XGEVA) 120 MG/1.7ML SOLN injection Inject 120 mg into the skin every 30 (thirty) days.   Yes Historical Provider, MD  hydrochlorothiazide (HYDRODIURIL) 50 MG tablet Take 50 mg by mouth daily.   Yes Historical Provider, MD  leuprolide (LUPRON) 30 MG injection Inject 30 mg into the muscle every 30 (thirty) days.    Yes Historical Provider, MD  lisinopril (PRINIVIL,ZESTRIL) 40 MG tablet Take 40 mg by mouth daily.   Yes Historical Provider, MD  LORazepam (ATIVAN) 0.5 MG tablet Take 1 tablet (0.5 mg total) by mouth at bedtime as needed for sleep. 11/29/13  Yes Hosie Poisson, MD  metroNIDAZOLE (METROGEL) 0.75 % gel Apply 1 application topically daily as needed (rosacea).   Yes Historical Provider, MD  naproxen sodium (ANAPROX) 220 MG tablet Take 220 mg by mouth 2 (two) times daily with a meal.   Yes Historical Provider, MD  Omega 3 1000 MG CAPS Take 1 capsule by mouth daily.   Yes Historical Provider, MD  omeprazole (PRILOSEC) 20 MG capsule Take 20 mg by mouth daily.   Yes Historical Provider, MD  Polyethyl Glycol-Propyl Glycol (SYSTANE OP) Apply 1 drop to eye 2 (two) times daily as needed (dry eyes).   Yes Historical Provider, MD  sodium chloride (OCEAN) 0.65 % SOLN nasal spray Place 1 spray into both nostrils as needed for  congestion.   Yes Historical Provider, MD   BP 138/87 mmHg  Pulse 64  Temp(Src) 98.1 F (36.7 C) (Oral)  Resp 19  SpO2 96% Physical Exam  Constitutional: He appears well-developed and well-nourished. No distress.  Elderly male  HENT:  Head: Normocephalic and atraumatic.  Mouth/Throat: Oropharynx is clear and moist. No oropharyngeal exudate.  Eyes: Right eye exhibits no discharge. Left eye exhibits no discharge. No scleral icterus.  Neck: Normal range of motion.  Cardiovascular: Normal rate, regular rhythm and normal heart sounds.   No murmur heard. Pulmonary/Chest: Effort normal and breath sounds normal. No respiratory distress.  Abdominal: Soft. There is no tenderness.  Musculoskeletal: Normal range of motion. He exhibits no edema or tenderness.  Neurological: He is alert. No cranial nerve deficit. Coordination normal. GCS eye subscore is 4. GCS verbal subscore is 5. GCS motor subscore is 6.  Patient fully alert answering questions appropriately in full, clear sentences.  Cranial nerves II through  XII grossly intact. Motor strength 5 out of 5 in all major muscle groups of upper and lower extremities. Distal sensation intact.  Skin: Skin is warm and dry. No rash noted. He is not diaphoretic.  Psychiatric: He has a normal mood and affect.  Nursing note and vitals reviewed.   ED Course  Procedures (including critical care time) Labs Review Labs Reviewed  CBC WITH DIFFERENTIAL - Abnormal; Notable for the following:    MCH 34.3 (*)    All other components within normal limits  BASIC METABOLIC PANEL - Abnormal; Notable for the following:    Glucose, Bld 100 (*)    Calcium 10.6 (*)    GFR calc non Af Amer 75 (*)    GFR calc Af Amer 87 (*)    All other components within normal limits  URINALYSIS, ROUTINE W REFLEX MICROSCOPIC - Abnormal; Notable for the following:    Protein, ur 30 (*)    All other components within normal limits  URINE MICROSCOPIC-ADD ON  I-STAT TROPOININ, ED     Imaging Review No results found.   EKG Interpretation   Date/Time:  Saturday July 09 2014 11:48:21 EST Ventricular Rate:  62 PR Interval:  157 QRS Duration: 81 QT Interval:  435 QTC Calculation: 442 R Axis:   75 Text Interpretation:  Sinus rhythm Atrial premature complex Borderline low  voltage, extremity leads U waves. Stable Confirmed by Alvino Chapel  MD,  Ovid Curd [phone]) on 07/09/2014 12:09:28 PM      MDM   Final diagnoses:  Essential hypertension    Patient here complaining of hypertension. Patient relatively normotensive in the ER with blood pressures 130s to 147 over 80s to 90s. Patient asymptomatic even when his blood pressure was high earlier this morning. No signs or symptoms of hypertensive urgency or emergency. Workup for rule out of end organ damage associated with hypertensive emergency.  EKG with no acute pathology. Lab work unremarkable for any leukocytosis, electrolyte abnormality, renal insufficiency. UA unremarkable. Troponin negative.   With patient remaining asymptomatic, and no objective evidence of end organ damage related with hypertensive urgency or emergency, and patient's blood pressures have been benign during stay, we will discharge patient and have him follow-up with his primary care doctor. Return precautions discussed with patient, and I encouraged patient to call or return to the ER should his symptoms persist, change, worsen or should he have any questions or concerns.  BP 138/87 mmHg  Pulse 64  Temp(Src) 98.1 F (36.7 C) (Oral)  Resp 19  SpO2 96%  Signed,  Dahlia Bailiff, PA-C 12:27 PM  Patient seen and discussed with Dr. Davonna Belling, M.D.  Carrie Mew, PA-C 07/09/14 [place] Alvino Chapel, MD 07/11/14 1357

## 2014-07-09 NOTE — ED Notes (Signed)
Bed: NL89 Expected date: 07/09/14 Expected time: 9:42 AM Means of arrival: Ambulance Comments: HTN

## 2014-07-13 ENCOUNTER — Encounter: Payer: Self-pay | Admitting: Neurology

## 2014-07-19 ENCOUNTER — Encounter: Payer: Self-pay | Admitting: Neurology

## 2014-07-20 ENCOUNTER — Ambulatory Visit: Payer: Medicare Other | Admitting: Cardiology

## 2014-11-04 ENCOUNTER — Other Ambulatory Visit: Payer: Self-pay | Admitting: Urology

## 2014-11-04 DIAGNOSIS — C61 Malignant neoplasm of prostate: Secondary | ICD-10-CM

## 2014-11-04 DIAGNOSIS — C7951 Secondary malignant neoplasm of bone: Secondary | ICD-10-CM

## 2014-11-21 ENCOUNTER — Encounter (HOSPITAL_COMMUNITY): Payer: Medicare Other

## 2014-11-21 ENCOUNTER — Ambulatory Visit (HOSPITAL_COMMUNITY): Payer: Medicare Other

## 2014-11-25 ENCOUNTER — Encounter (HOSPITAL_COMMUNITY)
Admission: RE | Admit: 2014-11-25 | Discharge: 2014-11-25 | Disposition: A | Discharge status: Home / Self Care | Payer: Medicare Other | Source: Ambulatory Visit | Attending: Urology | Admitting: Urology

## 2014-11-25 DIAGNOSIS — C7951 Secondary malignant neoplasm of bone: Secondary | ICD-10-CM | POA: Diagnosis present

## 2014-11-25 DIAGNOSIS — C61 Malignant neoplasm of prostate: Secondary | ICD-10-CM | POA: Diagnosis present

## 2014-11-25 MED ORDER — TECHNETIUM TC 99M MEDRONATE IV KIT
25.7000 | PACK | Freq: Once | INTRAVENOUS | Status: AC | PRN
  Administered 2014-11-25: 25.7 via INTRAVENOUS

## 2014-12-18 ENCOUNTER — Emergency Department (HOSPITAL_COMMUNITY): Payer: Medicare Other

## 2014-12-18 ENCOUNTER — Encounter (HOSPITAL_COMMUNITY): Payer: Self-pay | Admitting: Emergency Medicine

## 2014-12-18 ENCOUNTER — Emergency Department (HOSPITAL_COMMUNITY)
Admission: EM | Admit: 2014-12-18 | Discharge: 2014-12-19 | Disposition: A | Discharge status: Home / Self Care | Payer: Medicare Other | Attending: Emergency Medicine | Admitting: Emergency Medicine

## 2014-12-18 DIAGNOSIS — Z880 Allergy status to penicillin: Secondary | ICD-10-CM | POA: Insufficient documentation

## 2014-12-18 DIAGNOSIS — G4700 Insomnia, unspecified: Secondary | ICD-10-CM | POA: Diagnosis not present

## 2014-12-18 DIAGNOSIS — M1990 Unspecified osteoarthritis, unspecified site: Secondary | ICD-10-CM | POA: Insufficient documentation

## 2014-12-18 DIAGNOSIS — F419 Anxiety disorder, unspecified: Secondary | ICD-10-CM | POA: Insufficient documentation

## 2014-12-18 DIAGNOSIS — J449 Chronic obstructive pulmonary disease, unspecified: Secondary | ICD-10-CM | POA: Insufficient documentation

## 2014-12-18 DIAGNOSIS — Z8619 Personal history of other infectious and parasitic diseases: Secondary | ICD-10-CM | POA: Diagnosis not present

## 2014-12-18 DIAGNOSIS — I10 Essential (primary) hypertension: Secondary | ICD-10-CM | POA: Insufficient documentation

## 2014-12-18 DIAGNOSIS — Z7902 Long term (current) use of antithrombotics/antiplatelets: Secondary | ICD-10-CM | POA: Diagnosis not present

## 2014-12-18 DIAGNOSIS — Z8546 Personal history of malignant neoplasm of prostate: Secondary | ICD-10-CM | POA: Diagnosis not present

## 2014-12-18 DIAGNOSIS — K219 Gastro-esophageal reflux disease without esophagitis: Secondary | ICD-10-CM | POA: Insufficient documentation

## 2014-12-18 DIAGNOSIS — E785 Hyperlipidemia, unspecified: Secondary | ICD-10-CM | POA: Diagnosis not present

## 2014-12-18 DIAGNOSIS — Z85830 Personal history of malignant neoplasm of bone: Secondary | ICD-10-CM | POA: Insufficient documentation

## 2014-12-18 DIAGNOSIS — Z87891 Personal history of nicotine dependence: Secondary | ICD-10-CM | POA: Diagnosis not present

## 2014-12-18 DIAGNOSIS — J40 Bronchitis, not specified as acute or chronic: Secondary | ICD-10-CM

## 2014-12-18 DIAGNOSIS — Z7982 Long term (current) use of aspirin: Secondary | ICD-10-CM | POA: Diagnosis not present

## 2014-12-18 DIAGNOSIS — I252 Old myocardial infarction: Secondary | ICD-10-CM | POA: Diagnosis not present

## 2014-12-18 DIAGNOSIS — Z8701 Personal history of pneumonia (recurrent): Secondary | ICD-10-CM | POA: Insufficient documentation

## 2014-12-18 DIAGNOSIS — Z79899 Other long term (current) drug therapy: Secondary | ICD-10-CM | POA: Diagnosis not present

## 2014-12-18 DIAGNOSIS — R509 Fever, unspecified: Secondary | ICD-10-CM | POA: Diagnosis present

## 2014-12-18 DIAGNOSIS — Z8673 Personal history of transient ischemic attack (TIA), and cerebral infarction without residual deficits: Secondary | ICD-10-CM | POA: Insufficient documentation

## 2014-12-18 DIAGNOSIS — I2510 Atherosclerotic heart disease of native coronary artery without angina pectoris: Secondary | ICD-10-CM | POA: Diagnosis not present

## 2014-12-18 LAB — BASIC METABOLIC PANEL
Anion gap: 9 (ref 5–15)
BUN: 14 mg/dL (ref 6–23)
CO2: 25 mmol/L (ref 19–32)
Calcium: 8.9 mg/dL (ref 8.4–10.5)
Chloride: 102 mmol/L (ref 96–112)
Creatinine, Ser: 0.9 mg/dL (ref 0.50–1.35)
GFR calc Af Amer: 87 mL/min — ABNORMAL LOW (ref >90)
GFR calc non Af Amer: 75 mL/min — ABNORMAL LOW (ref >90)
Glucose, Bld: 100 mg/dL — ABNORMAL HIGH (ref 70–99)
Potassium: 4 mmol/L (ref 3.5–5.1)
Sodium: 136 mmol/L (ref 135–145)

## 2014-12-18 LAB — CBC WITH DIFFERENTIAL/PLATELET
Basophils Absolute: 0 10*3/uL (ref 0.0–0.1)
Basophils Relative: 0 % (ref 0–1)
Eosinophils Absolute: 0.1 10*3/uL (ref 0.0–0.7)
Eosinophils Relative: 2 % (ref 0–5)
HCT: 42.3 % (ref 39.0–52.0)
Hemoglobin: 14.4 g/dL (ref 13.0–17.0)
Lymphocytes Relative: 22 % (ref 12–46)
Lymphs Abs: 1.6 10*3/uL (ref 0.7–4.0)
MCH: 33.2 pg (ref 26.0–34.0)
MCHC: 34 g/dL (ref 30.0–36.0)
MCV: 97.5 fL (ref 78.0–100.0)
Monocytes Absolute: 1.5 10*3/uL — ABNORMAL HIGH (ref 0.1–1.0)
Monocytes Relative: 20 % — ABNORMAL HIGH (ref 3–12)
Neutro Abs: 4.2 10*3/uL (ref 1.7–7.7)
Neutrophils Relative %: 56 % (ref 43–77)
Platelets: 194 10*3/uL (ref 150–400)
RBC: 4.34 MIL/uL (ref 4.22–5.81)
RDW: 12.6 % (ref 11.5–15.5)
WBC: 7.4 10*3/uL (ref 4.0–10.5)

## 2014-12-18 LAB — URINALYSIS, ROUTINE W REFLEX MICROSCOPIC
Bilirubin Urine: NEGATIVE
Glucose, UA: NEGATIVE mg/dL
Hgb urine dipstick: NEGATIVE
Ketones, ur: NEGATIVE mg/dL
Leukocytes, UA: NEGATIVE
Nitrite: NEGATIVE
Protein, ur: NEGATIVE mg/dL
Specific Gravity, Urine: 1.023 (ref 1.005–1.030)
Urobilinogen, UA: 0.2 mg/dL (ref 0.0–1.0)
pH: 6.5 (ref 5.0–8.0)

## 2014-12-18 LAB — I-STAT CG4 LACTIC ACID, ED: Lactic Acid, Venous: 0.93 mmol/L (ref 0.5–2.0)

## 2014-12-18 MED ORDER — GUAIFENESIN ER 1200 MG PO TB12: 1.0000 | ORAL_TABLET | Freq: Two times a day (BID) | ORAL | Status: DC

## 2014-12-18 MED ORDER — ACETAMINOPHEN 325 MG PO TABS
650.0000 mg | ORAL_TABLET | Freq: Four times a day (QID) | ORAL | Status: DC | PRN
  Administered 2014-12-18: 650 mg via ORAL
  Filled 2014-12-18: qty 2

## 2014-12-18 MED ORDER — PREDNISONE 50 MG PO TABS: 50.0000 mg | ORAL_TABLET | Freq: Every day | ORAL | Status: DC

## 2014-12-18 MED ORDER — DOXYCYCLINE HYCLATE 100 MG PO CAPS: 100.0000 mg | ORAL_CAPSULE | Freq: Two times a day (BID) | ORAL | Status: DC

## 2014-12-18 MED ORDER — ALBUTEROL SULFATE HFA 108 (90 BASE) MCG/ACT IN AERS
2.0000 | INHALATION_SPRAY | RESPIRATORY_TRACT | Status: DC | PRN
  Filled 2014-12-18: qty 6.7

## 2014-12-18 MED ORDER — AEROCHAMBER PLUS FLO-VU MEDIUM MISC
1.0000 | Freq: Once | Status: AC
  Administered 2014-12-18: 1
  Filled 2014-12-18 (×2): qty 1

## 2014-12-18 MED ORDER — IPRATROPIUM-ALBUTEROL 0.5-2.5 (3) MG/3ML IN SOLN
3.0000 mL | Freq: Once | RESPIRATORY_TRACT | Status: AC
  Administered 2014-12-18: 3 mL via RESPIRATORY_TRACT
  Filled 2014-12-18: qty 3

## 2014-12-18 NOTE — ED Notes (Signed)
Bed: [facility] Expected date:  Expected time:  Means of arrival:  Comments: Hypertension

## 2014-12-18 NOTE — Discharge Instructions (Signed)
Return here as needed.  Follow-up with your primary care Dr. increase your fluid intake and rest as much as possible °

## 2014-12-18 NOTE — ED Notes (Signed)
Pt from [place] c/o hypertension and fever of 100.7. Per EMS pt took BP meds however BP goes down then will come back up. Denies Blurred vision, headache, or dizziness.

## 2014-12-18 NOTE — ED Notes (Addendum)
Pt ambulated in hallway with initial SPO2 of 93% RA. He ambulated from [place] B to Restroom 4. Patient SPO2 dropped to 88% but quickly recovered to 91- 94% on RA. Pt denies shortness of breath. He tolerated walking well.

## 2014-12-18 NOTE — ED Notes (Signed)
PA Lawyer at bedside.  

## 2014-12-18 NOTE — ED Notes (Signed)
Pt transported to XRAY °

## 2014-12-18 NOTE — ED Notes (Signed)
Pt ambulated to restroom with limited assistance.

## 2014-12-19 NOTE — ED Notes (Signed)
Pt alert and oriented. He was assisted to the lobby by BY Joy. His daughter is driving him  To friends home [place].

## 2014-12-19 NOTE — ED Provider Notes (Signed)
CSN: [id number]     Arrival date & time 12/18/14  1855 History   First MD Initiated Contact with Patient 12/18/14 1915     Chief Complaint  Patient presents with  . Hypertension  . Fever     (Consider location/radiation/quality/duration/timing/severity/associated sxs/prior Treatment) HPI Patient presents to the emergency department with fever, cough, nasal congestion and runny nose.  The patient states that he lives at a nursing facility and has COPD and uses inhalers on a daily basis.  The patient states that his blood pressure seemed to be up some, and that is why he came to the emergency department.  His pulse oximetry readings are in the low 90s chronically.  The patient denies chest pain, weakness, dizziness, headache, blurred vision, back pain, neck pain, dysuria, abdominal pain, shortness of breath or syncope.  The patient states that he has not felt any ill effects of his blood pressure Past Medical History  Diagnosis Date  . Hypertension   . Degenerative disc disease   . Degenerative joint disease   . Genital herpes   . Diverticulitis   . Prostate cancer 2012    mets to bone  . Lumbosacral spondylolysis   . Hip arthritis 09/29/2013    Bilateral  . Pneumonia 11/24/13  . Other and unspecified hyperlipidemia   . Malignant neoplasm of prostate   . GERD (gastroesophageal reflux disease)   . Cervicalgia   . Anxiety state, unspecified   . Diverticulosis   . TIA (transient ischemic attack)   . Lumbago   . Carbon monoxide exposure 11/24/13  . Hypoxia 11/24/13  . Hyponatremia 11/24/13  . NSTEMI (non-ST elevated myocardial infarction) 11/20/13  . CAD (coronary artery disease) 11/20/13  . Genital herpes 07/10/2013  . Insomnia 07/09/2013  . Postoperative anemia due to acute blood loss 06/22/2013  . OA (osteoarthritis) of knee 06/21/2013  . Lumbosacral spondylosis without myelopathy 09/29/2013  . Ejection fraction    Past Surgical History  Procedure Laterality Date  . Total knee  arthroplasty Right 06/21/2013    Procedure: RIGHT TOTAL KNEE ARTHROPLASTY;  Surgeon: Gearlean Alf, MD;  Location: [facility];  Service: Orthopedics;  Laterality: Right;. Dr. Wynelle Link  . Mastoidectomy Bilateral   . Cataract extraction Bilateral   . Tonsillectomy     Family History  Problem Relation Age of Onset  . Heart disease Mother    History  Substance Use Topics  . Smoking status: Former Smoker -- 2.00 packs/day for 15 years    Quit date: 01/14/1984  . Smokeless tobacco: Never Used  . Alcohol Use: 1.2 oz/week    2 Shots of liquor per week     Comment: occasional    Review of Systems  All other systems negative except as documented in the HPI. All pertinent positives and negatives as reviewed in the HPI.  Allergies  Penicillins  Home Medications   Prior to Admission medications   Medication Sig Start Date End Date Taking? Authorizing Provider  acyclovir (ZOVIRAX) 200 MG capsule Take 200 mg by mouth daily.  08/27/11  Yes Historical Provider, MD  amLODipine (NORVASC) 5 MG tablet Take 5 mg by mouth daily.   Yes Historical Provider, MD  aspirin EC 81 MG EC tablet Take 1 tablet (81 mg total) by mouth daily. Patient taking differently: Take 40.5 mg by mouth daily.  11/24/13  Yes Orson Eva, MD  atorvastatin (LIPITOR) 10 MG tablet Take 10 mg by mouth every Monday, Wednesday, and Friday.  04/23/12  Yes Historical Provider, MD  B Complex-C (B-COMPLEX WITH VITAMIN C) tablet Take 1 tablet by mouth daily.   Yes Historical Provider, MD  bicalutamide (CASODEX) 50 MG tablet Take 50 mg by mouth daily. 09/16/13  Yes Historical Provider, MD  Calcium Carbonate-Vitamin D (CALCIUM + D PO) Take 2 tablets by mouth daily.    Yes Historical Provider, MD  clopidogrel (PLAVIX) 75 MG tablet Take 75 mg by mouth daily.   Yes Historical Provider, MD  hydrochlorothiazide (HYDRODIURIL) 50 MG tablet Take 50 mg by mouth daily.   Yes Historical Provider, MD  lisinopril (PRINIVIL,ZESTRIL) 40 MG tablet Take 40 mg by  mouth daily.   Yes Historical Provider, MD  Omega 3 1000 MG CAPS Take 1 capsule by mouth daily.   Yes Historical Provider, MD  celecoxib (CELEBREX) 200 MG capsule Take 200 mg by mouth daily as needed for mild pain or moderate pain.     Historical Provider, MD  denosumab (XGEVA) 120 MG/1.7ML SOLN injection Inject 120 mg into the skin every 30 (thirty) days.    Historical Provider, MD  doxycycline (VIBRAMYCIN) 100 MG capsule Take 1 capsule (100 mg total) by mouth 2 (two) times daily. 12/18/14   Magnus Crescenzo, PA-C  Guaifenesin 1200 MG TB12 Take 1 tablet (1,200 mg total) by mouth 2 (two) times daily. 12/18/14   Dalia Heading, PA-C  leuprolide (LUPRON) 30 MG injection Inject 30 mg into the muscle every 30 (thirty) days.     Historical Provider, MD  LORazepam (ATIVAN) 0.5 MG tablet Take 1 tablet (0.5 mg total) by mouth at bedtime as needed for sleep. 11/29/13   Hosie Poisson, MD  metroNIDAZOLE (METROGEL) 0.75 % gel Apply 1 application topically daily as needed (rosacea).    Historical Provider, MD  naproxen sodium (ANAPROX) 220 MG tablet Take 220 mg by mouth 2 (two) times daily as needed.     Historical Provider, MD  omeprazole (PRILOSEC) 20 MG capsule Take 20 mg by mouth daily as needed (GERD).     Historical Provider, MD  Polyethyl Glycol-Propyl Glycol (SYSTANE OP) Apply 1 drop to eye 2 (two) times daily as needed (dry eyes).    Historical Provider, MD  predniSONE (DELTASONE) 50 MG tablet Take 1 tablet (50 mg total) by mouth daily. 12/18/14   Dalia Heading, PA-C  sodium chloride (OCEAN) 0.65 % SOLN nasal spray Place 1 spray into both nostrils as needed for congestion.    Historical Provider, MD   BP 116/75 mmHg  Pulse 74  Temp(Src) 98.9 F (37.2 C) (Oral)  Resp 18  Ht 5\' 9"  (1.753 m)  Wt 155 lb (70.308 kg)  BMI 22.88 kg/m2  SpO2 93% Physical Exam  Constitutional: He is oriented to person, place, and time. He appears well-developed and well-nourished. No distress.  HENT:  Head:  Normocephalic and atraumatic.  Mouth/Throat: Oropharynx is clear and moist.  Eyes: Pupils are equal, round, and reactive to light.  Neck: Normal range of motion. Neck supple.  Cardiovascular: Normal rate, regular rhythm and normal heart sounds.  Exam reveals no gallop and no friction rub.   No murmur heard. Pulmonary/Chest: Effort normal and breath sounds normal. No respiratory distress. He has no wheezes.  Neurological: He is alert and oriented to person, place, and time. He exhibits normal muscle tone. Coordination normal.  Skin: Skin is warm and dry. No rash noted. No erythema.  Psychiatric: He has a normal mood and affect. His behavior is normal.  Nursing note and vitals reviewed.   ED Course  Procedures (including critical  care time) Labs Review Labs Reviewed  CBC WITH DIFFERENTIAL/PLATELET - Abnormal; Notable for the following:    Monocytes Relative 20 (*)    Monocytes Absolute 1.5 (*)    All other components within normal limits  BASIC METABOLIC PANEL - Abnormal; Notable for the following:    Glucose, Bld 100 (*)    GFR calc non Af Amer 75 (*)    GFR calc Af Amer 87 (*)    All other components within normal limits  URINALYSIS, ROUTINE W REFLEX MICROSCOPIC  I-STAT CG4 LACTIC ACID, ED    Imaging Review Dg Chest 2 View  12/18/2014   CLINICAL DATA:  Fever  EXAM: CHEST  2 VIEW  COMPARISON:  05/16/2014  FINDINGS: COPD with hyperinflation of the lungs. Diffuse pulmonary fibrosis in the lungs. Pleural scarring left lung base unchanged. Calcified granuloma lingula unchanged  Negative for pneumonia.  Negative for heart failure or effusion.  IMPRESSION: COPD with pulmonary scarring. No acute abnormality and no change from the prior examination.   Electronically Signed   By: Franchot Gallo M.D.   On: 12/18/2014 20:58    The patient would like to be discharged home.  He is feeling better at this time.  He is given a breathing treatment and will be treated with antibiotics and told to  follow-up with his primary care doctor.  The patient agrees the plan and the daughter is also present and agrees to this plan.  They are told to return here for any worsening in his condition  Dalia Heading, PA-C 12/21/14 1659  Milton Ferguson, MD 12/26/14 1537

## 2015-01-10 ENCOUNTER — Ambulatory Visit
Admission: RE | Admit: 2015-01-10 | Discharge: 2015-01-10 | Disposition: A | Discharge status: Home / Self Care | Payer: Medicare Other | Source: Ambulatory Visit | Attending: Internal Medicine | Admitting: Internal Medicine

## 2015-01-10 ENCOUNTER — Other Ambulatory Visit: Payer: Self-pay | Admitting: Internal Medicine

## 2015-01-10 DIAGNOSIS — R059 Cough, unspecified: Secondary | ICD-10-CM

## 2015-01-10 DIAGNOSIS — R05 Cough: Secondary | ICD-10-CM

## 2015-01-11 ENCOUNTER — Other Ambulatory Visit: Payer: Self-pay | Admitting: Internal Medicine

## 2015-01-11 DIAGNOSIS — R0989 Other specified symptoms and signs involving the circulatory and respiratory systems: Secondary | ICD-10-CM

## 2015-01-28 ENCOUNTER — Other Ambulatory Visit: Payer: Medicare Other

## 2015-08-06 ENCOUNTER — Emergency Department (HOSPITAL_COMMUNITY)
Admission: EM | Admit: 2015-08-06 | Discharge: 2015-08-06 | Disposition: A | Discharge status: Home / Self Care | Payer: Medicare Other | Attending: Emergency Medicine | Admitting: Emergency Medicine

## 2015-08-06 ENCOUNTER — Encounter (HOSPITAL_COMMUNITY): Payer: Self-pay | Admitting: Emergency Medicine

## 2015-08-06 DIAGNOSIS — M179 Osteoarthritis of knee, unspecified: Secondary | ICD-10-CM | POA: Insufficient documentation

## 2015-08-06 DIAGNOSIS — Z8701 Personal history of pneumonia (recurrent): Secondary | ICD-10-CM | POA: Insufficient documentation

## 2015-08-06 DIAGNOSIS — Z7982 Long term (current) use of aspirin: Secondary | ICD-10-CM | POA: Diagnosis not present

## 2015-08-06 DIAGNOSIS — G4700 Insomnia, unspecified: Secondary | ICD-10-CM | POA: Diagnosis not present

## 2015-08-06 DIAGNOSIS — Z8673 Personal history of transient ischemic attack (TIA), and cerebral infarction without residual deficits: Secondary | ICD-10-CM | POA: Insufficient documentation

## 2015-08-06 DIAGNOSIS — Z7952 Long term (current) use of systemic steroids: Secondary | ICD-10-CM | POA: Insufficient documentation

## 2015-08-06 DIAGNOSIS — Z79899 Other long term (current) drug therapy: Secondary | ICD-10-CM | POA: Insufficient documentation

## 2015-08-06 DIAGNOSIS — I10 Essential (primary) hypertension: Secondary | ICD-10-CM | POA: Insufficient documentation

## 2015-08-06 DIAGNOSIS — I252 Old myocardial infarction: Secondary | ICD-10-CM | POA: Diagnosis not present

## 2015-08-06 DIAGNOSIS — K219 Gastro-esophageal reflux disease without esophagitis: Secondary | ICD-10-CM | POA: Insufficient documentation

## 2015-08-06 DIAGNOSIS — M160 Bilateral primary osteoarthritis of hip: Secondary | ICD-10-CM | POA: Diagnosis not present

## 2015-08-06 DIAGNOSIS — F419 Anxiety disorder, unspecified: Secondary | ICD-10-CM | POA: Insufficient documentation

## 2015-08-06 DIAGNOSIS — Z792 Long term (current) use of antibiotics: Secondary | ICD-10-CM | POA: Diagnosis not present

## 2015-08-06 DIAGNOSIS — E785 Hyperlipidemia, unspecified: Secondary | ICD-10-CM | POA: Insufficient documentation

## 2015-08-06 DIAGNOSIS — Z7902 Long term (current) use of antithrombotics/antiplatelets: Secondary | ICD-10-CM | POA: Diagnosis not present

## 2015-08-06 DIAGNOSIS — Z85830 Personal history of malignant neoplasm of bone: Secondary | ICD-10-CM | POA: Insufficient documentation

## 2015-08-06 DIAGNOSIS — Z8619 Personal history of other infectious and parasitic diseases: Secondary | ICD-10-CM | POA: Diagnosis not present

## 2015-08-06 DIAGNOSIS — I2510 Atherosclerotic heart disease of native coronary artery without angina pectoris: Secondary | ICD-10-CM | POA: Insufficient documentation

## 2015-08-06 DIAGNOSIS — Z8546 Personal history of malignant neoplasm of prostate: Secondary | ICD-10-CM | POA: Insufficient documentation

## 2015-08-06 NOTE — ED Provider Notes (Signed)
CSN: [id number]     Arrival date & time 08/06/15  0703 History   First MD Initiated Contact with Patient 08/06/15 [phone]     Chief Complaint  Patient presents with  . Hypertension     (Consider location/radiation/quality/duration/timing/severity/associated sxs/prior Treatment) HPI  81 year old male with a history of hypertension presents from his independent living facility with hypertension. Patient states he typically checks his blood pressure every evening and it is usually [id number] or [id number] systolic. The patient states in the middle of the night he woke up feeling "edgy and nervous" and knew that his blood pressure was up. When he checked it it was 190/114. Patient states he typically gets edgy whenever his blood pressure increases. Due to this he took his Norvasc and his clonidine and called for help. When EMS arrived his blood pressure was 186/100. It decreased to 150 in route and now is 131/80. Patient states that edginess is gone. He denies currently or recently having headache, dizziness, chest pain, blurry vision, shortness of breath, or leg swelling. No weakness. Patient states he feels completely normal. Patient states he's had episodes like this very infrequently, typically his blood pressure is well controlled with his multiple medicines. Last time he had an episode like this was several months ago.   Past Medical History  Diagnosis Date  . Hypertension   . Degenerative disc disease   . Degenerative joint disease   . Genital herpes   . Diverticulitis   . Prostate cancer ([place]) 2012    mets to bone  . Lumbosacral spondylolysis   . Hip arthritis 09/29/2013    Bilateral  . Pneumonia 11/24/13  . Other and unspecified hyperlipidemia   . Malignant neoplasm of prostate ([place])   . GERD (gastroesophageal reflux disease)   . Cervicalgia   . Anxiety state, unspecified   . Diverticulosis   . TIA (transient ischemic attack)   . Lumbago   . Carbon monoxide exposure 11/24/13  . Hypoxia 11/24/13  .  Hyponatremia 11/24/13  . NSTEMI (non-ST elevated myocardial infarction) ([place]) 11/20/13  . CAD (coronary artery disease) 11/20/13  . Genital herpes 07/10/2013  . Insomnia 07/09/2013  . Postoperative anemia due to acute blood loss 06/22/2013  . OA (osteoarthritis) of knee 06/21/2013  . Lumbosacral spondylosis without myelopathy 09/29/2013  . Ejection fraction    Past Surgical History  Procedure Laterality Date  . Total knee arthroplasty Right 06/21/2013    Procedure: RIGHT TOTAL KNEE ARTHROPLASTY;  Surgeon: Gearlean Alf, MD;  Location: [facility];  Service: Orthopedics;  Laterality: Right;. Dr. Wynelle Link  . Mastoidectomy Bilateral   . Cataract extraction Bilateral   . Tonsillectomy     Family History  Problem Relation Age of Onset  . Heart disease Mother    Social History  Substance Use Topics  . Smoking status: Former Smoker -- 2.00 packs/day for 15 years    Quit date: 01/14/1984  . Smokeless tobacco: Never Used  . Alcohol Use: 1.2 oz/week    2 Shots of liquor per week     Comment: occasional    Review of Systems  Constitutional: Negative for fever.  Eyes: Negative for visual disturbance.  Respiratory: Negative for shortness of breath.   Cardiovascular: Negative for chest pain and leg swelling.  Neurological: Negative for dizziness, weakness, numbness and headaches.  All other systems reviewed and are negative.     Allergies  Penicillins  Home Medications   Prior to Admission medications   Medication Sig Start Date End Date Taking?  Authorizing Provider  acyclovir (ZOVIRAX) 200 MG capsule Take 200 mg by mouth daily.  08/27/11   Historical Provider, MD  amLODipine (NORVASC) 5 MG tablet Take 5 mg by mouth daily.    Historical Provider, MD  aspirin EC 81 MG EC tablet Take 1 tablet (81 mg total) by mouth daily. Patient taking differently: Take 40.5 mg by mouth daily.  11/24/13   Orson Eva, MD  atorvastatin (LIPITOR) 10 MG tablet Take 10 mg by mouth every Monday, Wednesday, and  Friday.  04/23/12   Historical Provider, MD  B Complex-C (B-COMPLEX WITH VITAMIN C) tablet Take 1 tablet by mouth daily.    Historical Provider, MD  bicalutamide (CASODEX) 50 MG tablet Take 50 mg by mouth daily. 09/16/13   Historical Provider, MD  Calcium Carbonate-Vitamin D (CALCIUM + D PO) Take 2 tablets by mouth daily.     Historical Provider, MD  celecoxib (CELEBREX) 200 MG capsule Take 200 mg by mouth daily as needed for mild pain or moderate pain.     Historical Provider, MD  clopidogrel (PLAVIX) 75 MG tablet Take 75 mg by mouth daily.    Historical Provider, MD  denosumab (XGEVA) 120 MG/1.7ML SOLN injection Inject 120 mg into the skin every 30 (thirty) days.    Historical Provider, MD  doxycycline (VIBRAMYCIN) 100 MG capsule Take 1 capsule (100 mg total) by mouth 2 (two) times daily. 12/18/14   Christopher Lawyer, PA-C  Guaifenesin 1200 MG TB12 Take 1 tablet (1,200 mg total) by mouth 2 (two) times daily. 12/18/14   Dalia Heading, PA-C  hydrochlorothiazide (HYDRODIURIL) 50 MG tablet Take 50 mg by mouth daily.    Historical Provider, MD  leuprolide (LUPRON) 30 MG injection Inject 30 mg into the muscle every 30 (thirty) days.     Historical Provider, MD  lisinopril (PRINIVIL,ZESTRIL) 40 MG tablet Take 40 mg by mouth daily.    Historical Provider, MD  LORazepam (ATIVAN) 0.5 MG tablet Take 1 tablet (0.5 mg total) by mouth at bedtime as needed for sleep. 11/29/13   Hosie Poisson, MD  metroNIDAZOLE (METROGEL) 0.75 % gel Apply 1 application topically daily as needed (rosacea).    Historical Provider, MD  naproxen sodium (ANAPROX) 220 MG tablet Take 220 mg by mouth 2 (two) times daily as needed.     Historical Provider, MD  Omega 3 1000 MG CAPS Take 1 capsule by mouth daily.    Historical Provider, MD  omeprazole (PRILOSEC) 20 MG capsule Take 20 mg by mouth daily as needed (GERD).     Historical Provider, MD  Polyethyl Glycol-Propyl Glycol (SYSTANE OP) Apply 1 drop to eye 2 (two) times daily as needed  (dry eyes).    Historical Provider, MD  predniSONE (DELTASONE) 50 MG tablet Take 1 tablet (50 mg total) by mouth daily. 12/18/14   Dalia Heading, PA-C  sodium chloride (OCEAN) 0.65 % SOLN nasal spray Place 1 spray into both nostrils as needed for congestion.    Historical Provider, MD   BP 131/80 mmHg  Pulse 54  Temp(Src) 97.9 F (36.6 C) (Oral)  Resp 18  SpO2 98% Physical Exam  Constitutional: He is oriented to person, place, and time. He appears well-developed and well-nourished. No distress.  Calmly resting on stretcher drinking water  HENT:  Head: Normocephalic and atraumatic.  Right Ear: External ear normal.  Left Ear: External ear normal.  Nose: Nose normal.  Eyes: EOM are normal. Pupils are equal, round, and reactive to light. Right eye exhibits no discharge. Left  eye exhibits no discharge.  Neck: Neck supple.  Cardiovascular: Normal rate, regular rhythm, normal heart sounds and intact distal pulses.   Pulmonary/Chest: Effort normal and breath sounds normal.  Abdominal: Soft. There is no tenderness.  Musculoskeletal: He exhibits no edema.  Neurological: He is alert and oriented to person, place, and time.  CN 2-12 grossly intact. 5/5 strength in all 4 extremities  Skin: Skin is warm and dry. He is not diaphoretic.  Nursing note and vitals reviewed.   ED Course  Procedures (including critical care time) Labs Review Labs Reviewed - No data to display  Imaging Review No results found. I have personally reviewed and evaluated these images and lab results as part of my medical decision-making.   EKG Interpretation None      MDM   Final diagnoses:  Essential hypertension    No signs of hypertensive emergency on history or exam. His blood pressure is normal on arrival although now it is waxing and waning but is not hypotensive. He continues to remain asymptomatic. I do not think lab work would be beneficial given that typically his blood pressures well  controlled. Plan to have him call his PCP tomorrow for reevaluation and possible medication adjustment. Advised of return precautions    Sherwood Gambler, MD 08/06/15 [phone]

## 2015-08-06 NOTE — ED Notes (Signed)
Brought in by EMS from [facility] facility with c/o hypertension.  Pt reported that he took his BP with an automatic sphygmomanometer and obtained BP191/114.  Pt took his Clonidine and Norvasc to lower his BP and called EMS.  On EMS' arrival, pt's BP was 186/100.  Pt's BP was 152/80 by EMS en route to ED.  Arrived to ED A/Ox4, no s/s apparent distress---- denies headache or dizziness or nausea.

## 2015-09-01 ENCOUNTER — Encounter: Payer: Self-pay | Admitting: Nurse Practitioner

## 2015-09-01 ENCOUNTER — Ambulatory Visit (INDEPENDENT_AMBULATORY_CARE_PROVIDER_SITE_OTHER): Payer: Medicare Other | Admitting: Nurse Practitioner

## 2015-09-01 VITALS — BP 98/60 | HR 54 | Ht 69.0 in | Wt 155.0 lb

## 2015-09-01 DIAGNOSIS — I10 Essential (primary) hypertension: Secondary | ICD-10-CM

## 2015-09-01 LAB — CBC
HCT: 37.6 % — ABNORMAL LOW (ref 39.0–52.0)
Hemoglobin: 13 g/dL (ref 13.0–17.0)
MCH: 33.4 pg (ref 26.0–34.0)
MCHC: 34.6 g/dL (ref 30.0–36.0)
MCV: 96.7 fL (ref 78.0–100.0)
MPV: 9.9 fL (ref 8.6–12.4)
Platelets: 200 10*3/uL (ref 150–400)
RBC: 3.89 MIL/uL — ABNORMAL LOW (ref 4.22–5.81)
RDW: 13.9 % (ref 11.5–15.5)
WBC: 7.7 10*3/uL (ref 4.0–10.5)

## 2015-09-01 LAB — HEPATIC FUNCTION PANEL
ALT: 12 U/L (ref 9–46)
AST: 16 U/L (ref 10–35)
Albumin: 4.4 g/dL (ref 3.6–5.1)
Alkaline Phosphatase: 30 U/L — ABNORMAL LOW (ref 40–115)
Bilirubin, Direct: 0.1 mg/dL (ref <=0.2)
Indirect Bilirubin: 0.5 mg/dL (ref 0.2–1.2)
Total Bilirubin: 0.6 mg/dL (ref 0.2–1.2)
Total Protein: 6.3 g/dL (ref 6.1–8.1)

## 2015-09-01 LAB — BASIC METABOLIC PANEL
BUN: 20 mg/dL (ref 7–25)
CO2: 28 mmol/L (ref 20–31)
Calcium: 9.3 mg/dL (ref 8.6–10.3)
Chloride: 105 mmol/L (ref 98–110)
Creat: 1.14 mg/dL — ABNORMAL HIGH (ref 0.70–1.11)
Glucose, Bld: 104 mg/dL — ABNORMAL HIGH (ref 65–99)
Potassium: 4.1 mmol/L (ref 3.5–5.3)
Sodium: 141 mmol/L (ref 135–146)

## 2015-09-01 MED ORDER — AMLODIPINE BESYLATE 5 MG PO TABS: 5.0000 mg | ORAL_TABLET | Freq: Every day | ORAL | Status: DC

## 2015-09-01 MED ORDER — LISINOPRIL 40 MG PO TABS: 20.0000 mg | ORAL_TABLET | Freq: Two times a day (BID) | ORAL | Status: DC

## 2015-09-01 NOTE — Progress Notes (Signed)
CARDIOLOGY OFFICE NOTE  Date:  09/01/2015    Craig [name] Date of Birth: 12/25/1925 Medical Record [id number]  PCP:  Myriam Jacobson, MD  Cardiologist:  Former Ron Parker patient    Chief Complaint  Patient presents with  . Palpitations    Work in visit - former patient of Dr. Kae Heller.     History of Present Illness: Craig [name] is a 82 y.o. male who presents today for a work in visit. Former patient of Dr. Kae Heller.   He has a history of HTN, presumed CAD with past NSTEMI from March of 2015 - negative Myoview, OA, prostate cancer, GERD and HLD.   Seen by Dr. Ron Parker in March of 2015 as a consult in the hospital - had had some shortness of breath and a cough - slightly elevated troponin - felt to be due to demand ischemia in the setting of pneumonia. Myoview normal at that time - no further evaluation felt to be needed. He was treated for pneumonia.  I saw him after an ER visit for elevated BP - he had had too much salt and was using NSAID. Last seen by Dr. Ron Parker in November of 2015 and was felt to be doing ok.   Presented to the ER back in December of 2016 with an elevated BP.   Comes back today. Here with daughter. She provides most of the history. BP up to 190's then will drop 100 points. HR down to the 30's. Here due to labile blood pressure. Has apparently had lots of medicine changes by his PCP - Dr. Mancel Bale. He is using prn Nifedipine as well - has lots of trouble taking this. More confused. He admits he does not know his medicines. Concern that he is not taking his medicines as prescribed. He is at [facility]. Not really using too much NSAID or salt. Has had multiple visits with his PCP for med changes. She notes HR is low when BP is high.   She gives me a list that has Lisinopril 40 mg a day - he says he is only taking 1/2 He says his Norvasc is 2 a day His list has Coreg 3.125 mg BID  Past Medical History  Diagnosis Date  . Hypertension   . Degenerative disc  disease   . Degenerative joint disease   . Genital herpes   . Diverticulitis   . Prostate cancer ([place]) 2012    mets to bone  . Lumbosacral spondylolysis   . Hip arthritis 09/29/2013    Bilateral  . Pneumonia 11/24/13  . Other and unspecified hyperlipidemia   . Malignant neoplasm of prostate ([place])   . GERD (gastroesophageal reflux disease)   . Cervicalgia   . Anxiety state, unspecified   . Diverticulosis   . TIA (transient ischemic attack)   . Lumbago   . Carbon monoxide exposure 11/24/13  . Hypoxia 11/24/13  . Hyponatremia 11/24/13  . NSTEMI (non-ST elevated myocardial infarction) ([place]) 11/20/13  . CAD (coronary artery disease) 11/20/13  . Genital herpes 07/10/2013  . Insomnia 07/09/2013  . Postoperative anemia due to acute blood loss 06/22/2013  . OA (osteoarthritis) of knee 06/21/2013  . Lumbosacral spondylosis without myelopathy 09/29/2013  . Ejection fraction     Past Surgical History  Procedure Laterality Date  . Total knee arthroplasty Right 06/21/2013    Procedure: RIGHT TOTAL KNEE ARTHROPLASTY;  Surgeon: Gearlean Alf, MD;  Location: [facility];  Service: Orthopedics;  Laterality: Right;. Dr. Wynelle Link  .  Mastoidectomy Bilateral   . Cataract extraction Bilateral   . Tonsillectomy       Medications: Current Outpatient Prescriptions  Medication Sig Dispense Refill  . acyclovir (ZOVIRAX) 200 MG capsule Take 200 mg by mouth daily.     Marland Kitchen amLODipine (NORVASC) 5 MG tablet Take 1 tablet (5 mg total) by mouth daily. May take an extra half a tablet prn SBP over 170 45 tablet 6  . B Complex-C (B-COMPLEX WITH VITAMIN C) tablet Take 1 tablet by mouth daily.    . bicalutamide (CASODEX) 50 MG tablet Take 50 mg by mouth daily.    . Calcium Carbonate-Vitamin D (CALCIUM + D PO) Take 2 tablets by mouth daily.     . clopidogrel (PLAVIX) 75 MG tablet Take 75 mg by mouth daily.    Marland Kitchen denosumab (XGEVA) 120 MG/1.7ML SOLN injection Inject 120 mg into the skin.     Marland Kitchen leuprolide (LUPRON) 30 MG injection  Inject 30 mg into the muscle every 30 (thirty) days.     Marland Kitchen lisinopril (PRINIVIL,ZESTRIL) 40 MG tablet Take 0.5 tablets (20 mg total) by mouth 2 (two) times daily. 60 tablet 6  . LORazepam (ATIVAN) 0.5 MG tablet Take 1 tablet (0.5 mg total) by mouth at bedtime as needed for sleep. 10 tablet 0  . memantine (NAMENDA) 5 MG tablet Take 5 mg by mouth daily.    Marland Kitchen omeprazole (PRILOSEC) 20 MG capsule Take 20 mg by mouth daily as needed (GERD).     Vladimir Faster Glycol-Propyl Glycol (SYSTANE OP) Apply 1 drop to eye 2 (two) times daily as needed (dry eyes).     No current facility-administered medications for this visit.    Allergies: Allergies  Allergen Reactions  . Penicillins Other (See Comments)    unknown    Social History: The patient  reports that he quit smoking about 31 years ago. He has never used smokeless tobacco. He reports that he drinks about 1.2 oz of alcohol per week.   Family History: The patient's family history includes Heart disease in his mother.   Review of Systems: Please see the history of present illness.   Otherwise, the review of systems is positive for none.   All other systems are reviewed and negative.   Physical Exam: VS:  BP 98/60 mmHg  Pulse 54  Ht 5\' 9"  (1.753 m)  Wt 155 lb (70.308 kg)  BMI 22.88 kg/m2 .  BMI Body mass index is 22.88 kg/(m^2).  Wt Readings from Last 3 Encounters:  09/01/15 155 lb (70.308 kg)  12/18/14 155 lb (70.308 kg)  07/04/14 164 lb 12.8 oz (74.753 kg)    General: Pleasant. Elderly male who is alert and in no acute distress. His weight is down 9 pounds since last visit.  HEENT: Normal. Neck: Supple, no JVD, carotid bruits, or masses noted.  Cardiac: Regular rate and rhythm. No murmurs, rubs, or gallops. No edema.  Respiratory:  Lungs are clear to auscultation bilaterally with normal work of breathing.  GI: Soft and nontender.  MS: No deformity or atrophy. Gait and ROM intact. Skin: Warm and dry. Color is normal.  Neuro:   Strength and sensation are intact and no gross focal deficits noted.  Psych: Alert, appropriate and with normal affect.   LABORATORY DATA:  EKG:  EKG is ordered today. This demonstrates sinus bradycardia.  Lab Results  Component Value Date   WBC 7.4 12/18/2014   HGB 14.4 12/18/2014   HCT 42.3 12/18/2014   PLT 194 12/18/2014  GLUCOSE 100* 12/18/2014   CHOL 132 11/22/2013   TRIG 55 11/22/2013   HDL 77 11/22/2013   LDLCALC 44 11/22/2013   ALT 29 12/06/2013   AST 23 12/06/2013   NA 136 12/18/2014   K 4.0 12/18/2014   CL 102 12/18/2014   CREATININE 0.90 12/18/2014   BUN 14 12/18/2014   CO2 25 12/18/2014   TSH 0.840 06/25/2013   PSA 0.23 03/16/2013   INR 1.20 11/23/2013   HGBA1C 5.5 06/24/2013    BNP (last 3 results) No results for input(s): BNP in the last 8760 hours.  ProBNP (last 3 results) No results for input(s): PROBNP in the last 8760 hours.   Other Studies Reviewed Today:  Echo Study Conclusions from 10/2013  Left ventricle: The cavity size was normal. Wall thickness was normal. Systolic function was normal. The estimated ejection fraction was in the range of 60% to 65%. Wall motion was normal; there were no regional wall motion abnormalities. Left ventricular diastolic function parameters were normal.  Impressions:  - No cardiac source of emboli was indentified.   MYOVIEW FINDINGS FROM 10/2013: Decreased uptake along the inferior wall on both the rest and stress images. This finding could be related to diaphragmatic attenuation. There may be gut uptake on the stress images. There is no evidence for a reversible defect. The left ventricle wall motion is within normal limits, including the inferior wall. Calculated ejection fraction is 57%. End-diastolic volume is 81 ml and end systolic volume is 35 ml.  IMPRESSION: No evidence for pharmacological induced ischemia.  Calculated ejection fraction is 57%.  Probable diaphragmatic  attenuation.   Electronically Signed  By: Markus Daft M.D.  On: 11/23/2013 15:10   Assessment/Plan: 1. Labile HTN - I suspect this is due to his dementia - he is dosing his own medicines. He is agreeable to letting his daughter and the staff at [facility] to start. Will stop his Coreg for now. Cutting Norvasc to just one a day. Split dose his ACE to 20 mg BID. See back Tuesday with all his medicines and his BP cuff. He will monitor his BP twice a day for me in the interim. If his SBP is over 170 - he may have a half of Norvasc (2.5).   2. Dementia - suspect this is the etiology for what is going on at this time.   3. Advanced age  Current medicines are reviewed with the patient today.  The patient does not have concerns regarding medicines other than what has been noted above.  The following changes have been made:  See above.  Labs/ tests ordered today include:    Orders Placed This Encounter  Procedures  . Basic metabolic panel  . CBC  . Hepatic function panel  . TSH  . EKG 12-Lead     Disposition:   FU   Patient is agreeable to this plan and will call if any problems develop in the interim.   Signed: Burtis Junes, RN, ANP-C 09/01/2015 2:37 PM  [facility] [address] [place], [place]  [address] Phone: [phone] Fax: [phone]

## 2015-09-01 NOTE — Patient Instructions (Addendum)
We will be checking the following labs today - BMET, CBC, TSH and HPF   Medication Instructions:    Continue with your current medicines. Go by this list of medicines.  Bring all your medicines with you next week    Testing/Procedures To Be Arranged:  N/A  Follow-Up:   See     Other Special Instructions:   Monitor your BP morning and evening - keep a diary - bring your cuff in for me to check.     If you need a refill on your cardiac medications before your next appointment, please call your pharmacy.   Call the [place] office at [phone] if you have any questions, problems or concerns.

## 2015-09-02 LAB — TSH: TSH: 1.264 u[IU]/mL (ref 0.350–4.500)

## 2015-09-05 ENCOUNTER — Encounter: Payer: Self-pay | Admitting: Nurse Practitioner

## 2015-09-05 ENCOUNTER — Ambulatory Visit (INDEPENDENT_AMBULATORY_CARE_PROVIDER_SITE_OTHER): Payer: Medicare Other | Admitting: Nurse Practitioner

## 2015-09-05 VITALS — BP 136/76 | HR 58 | Ht 69.0 in | Wt 155.8 lb

## 2015-09-05 DIAGNOSIS — I10 Essential (primary) hypertension: Secondary | ICD-10-CM

## 2015-09-05 NOTE — Progress Notes (Signed)
CARDIOLOGY OFFICE NOTE  Date:  09/05/2015    Craig [name] Date of Birth: February 13, 1926 Medical Record [id number]  PCP:  Myriam Jacobson, MD  Cardiologist:  Former Ron Parker    Chief Complaint  Patient presents with  . Hypertension    Follow up visit - former patient of Dr. Kae Heller    History of Present Illness: Craig [name] is a 82 y.o. male who presents today for a follow up visit. Former patient of Dr. Kae Heller.   He has a history of HTN, presumed CAD with past NSTEMI from March of 2015 - negative Myoview, OA, prostate cancer, GERD and HLD.   Seen by Dr. Ron Parker in March of 2015 as a consult in the hospital - had had some shortness of breath and a cough - slightly elevated troponin - felt to be due to demand ischemia in the setting of pneumonia. Myoview normal at that time - no further evaluation felt to be needed. He was treated for pneumonia.  I saw him after an ER visit for elevated BP - he had had too much salt and was using NSAID. Last seen by Dr. Ron Parker in November of 2015 and was felt to be doing ok.   Presented to the ER back in December of 2016 with an elevated BP. I saw him last week - very labile BP but seemed to be related to not taking his medicines as he should have been. Lots of changes had been made. He was on Coreg - HR low and BP would be high. His med list did not correlate with ours. I tried to get it straightened out and we agreed to let his daughter and staff oversee his medicines. I stopped the Coreg. Split dosed the ACE and cut the Norvasc but given the ok to use extra prn. Daughter concerned about some confusion/possible dementia.   Comes back today. Here with daughter again today. He is doing well. BP is good on this current regimen. Other medicines were brought and were discarded. His daughter is fixing his pill box weekly - this so far, is going well. BP is good and his cuff correlates well. He is asking about driving.   Past Medical History    Diagnosis Date  . Hypertension   . Degenerative disc disease   . Degenerative joint disease   . Genital herpes   . Diverticulitis   . Prostate cancer ([place]) 2012    mets to bone  . Lumbosacral spondylolysis   . Hip arthritis 09/29/2013    Bilateral  . Pneumonia 11/24/13  . Other and unspecified hyperlipidemia   . Malignant neoplasm of prostate ([place])   . GERD (gastroesophageal reflux disease)   . Cervicalgia   . Anxiety state, unspecified   . Diverticulosis   . TIA (transient ischemic attack)   . Lumbago   . Carbon monoxide exposure 11/24/13  . Hypoxia 11/24/13  . Hyponatremia 11/24/13  . NSTEMI (non-ST elevated myocardial infarction) ([place]) 11/20/13  . CAD (coronary artery disease) 11/20/13  . Genital herpes 07/10/2013  . Insomnia 07/09/2013  . Postoperative anemia due to acute blood loss 06/22/2013  . OA (osteoarthritis) of knee 06/21/2013  . Lumbosacral spondylosis without myelopathy 09/29/2013  . Ejection fraction     Past Surgical History  Procedure Laterality Date  . Total knee arthroplasty Right 06/21/2013    Procedure: RIGHT TOTAL KNEE ARTHROPLASTY;  Surgeon: Gearlean Alf, MD;  Location: [facility];  Service: Orthopedics;  Laterality: Right;. Dr.  Aluisio  . Mastoidectomy Bilateral   . Cataract extraction Bilateral   . Tonsillectomy       Medications: Current Outpatient Prescriptions  Medication Sig Dispense Refill  . acyclovir (ZOVIRAX) 200 MG capsule Take 200 mg by mouth daily.     Marland Kitchen amLODipine (NORVASC) 5 MG tablet Take 1 tablet (5 mg total) by mouth daily. May take an extra half a tablet prn SBP over 170 45 tablet 6  . B Complex-C (B-COMPLEX WITH VITAMIN C) tablet Take 1 tablet by mouth daily.    . bicalutamide (CASODEX) 50 MG tablet Take 50 mg by mouth daily.    . Calcium Carbonate-Vitamin D (CALCIUM + D PO) Take 2 tablets by mouth daily.     . clopidogrel (PLAVIX) 75 MG tablet Take 75 mg by mouth daily.    Marland Kitchen denosumab (XGEVA) 120 MG/1.7ML SOLN injection Inject 120 mg  into the skin.     Marland Kitchen leuprolide (LUPRON) 30 MG injection Inject 30 mg into the muscle every 30 (thirty) days.     Marland Kitchen lisinopril (PRINIVIL,ZESTRIL) 40 MG tablet Take 0.5 tablets (20 mg total) by mouth 2 (two) times daily. 60 tablet 6  . LORazepam (ATIVAN) 0.5 MG tablet Take 1 tablet (0.5 mg total) by mouth at bedtime as needed for sleep. 10 tablet 0  . memantine (NAMENDA) 5 MG tablet Take 5 mg by mouth daily.    Marland Kitchen omeprazole (PRILOSEC) 20 MG capsule Take 20 mg by mouth daily as needed (GERD).     Vladimir Faster Glycol-Propyl Glycol (SYSTANE OP) Apply 1 drop to eye 2 (two) times daily as needed (dry eyes).    . Probiotic Product (PROBIOTIC ADVANCED PO) Take by mouth daily.     No current facility-administered medications for this visit.    Allergies: Allergies  Allergen Reactions  . Penicillins Other (See Comments)    unknown    Social History: The patient  reports that he quit smoking about 31 years ago. He has never used smokeless tobacco. He reports that he drinks about 1.2 oz of alcohol per week.   Family History: The patient's family history includes Heart disease in his mother.   Review of Systems: Please see the history of present illness.   Otherwise, the review of systems is positive for none.   All other systems are reviewed and negative.   Physical Exam: VS:  BP 136/76 mmHg  Pulse 58  Ht 5\' 9"  (1.753 m)  Wt 155 lb 12.8 oz (70.67 kg)  BMI 23.00 kg/m2 .  BMI Body mass index is 23 kg/(m^2).  Wt Readings from Last 3 Encounters:  09/05/15 155 lb 12.8 oz (70.67 kg)  09/01/15 155 lb (70.308 kg)  12/18/14 155 lb (70.308 kg)    General: Pleasant. Elderly male who is alert and in no acute distress.  Neck: Supple, no JVD, carotid bruits, or masses noted.  Cardiac: Regular rate and rhythm. No murmurs, rubs, or gallops. No edema.  Respiratory:  Lungs are clear to auscultation bilaterally with normal work of breathing.  MS: No deformity or atrophy. Gait and ROM intact. Skin: Warm  and dry. Color is normal.  Neuro:  Strength and sensation are intact and no gross focal deficits noted.  Psych: Alert, appropriate and with normal affect.   LABORATORY DATA:  EKG:  EKG is not ordered today.  Lab Results  Component Value Date   WBC 7.7 09/01/2015   HGB 13.0 09/01/2015   HCT 37.6* 09/01/2015   PLT 200 09/01/2015  GLUCOSE 104* 09/01/2015   CHOL 132 11/22/2013   TRIG 55 11/22/2013   HDL 77 11/22/2013   LDLCALC 44 11/22/2013   ALT 12 09/01/2015   AST 16 09/01/2015   NA 141 09/01/2015   K 4.1 09/01/2015   CL 105 09/01/2015   CREATININE 1.14* 09/01/2015   BUN 20 09/01/2015   CO2 28 09/01/2015   TSH 1.264 09/01/2015   PSA 0.23 03/16/2013   INR 1.20 11/23/2013   HGBA1C 5.5 06/24/2013    BNP (last 3 results) No results for input(s): BNP in the last 8760 hours.  ProBNP (last 3 results) No results for input(s): PROBNP in the last 8760 hours.   Other Studies Reviewed Today:   Assessment/Plan: Echo Study Conclusions from 10/2013  Left ventricle: The cavity size was normal. Wall thickness was normal. Systolic function was normal. The estimated ejection fraction was in the range of 60% to 65%. Wall motion was normal; there were no regional wall motion abnormalities. Left ventricular diastolic function parameters were normal.  Impressions:  - No cardiac source of emboli was indentified.   MYOVIEW FINDINGS FROM 10/2013: Decreased uptake along the inferior wall on both the rest and stress images. This finding could be related to diaphragmatic attenuation. There may be gut uptake on the stress images. There is no evidence for a reversible defect. The left ventricle wall motion is within normal limits, including the inferior wall. Calculated ejection fraction is 57%. End-diastolic volume is 81 ml and end systolic volume is 35 ml.  IMPRESSION: No evidence for pharmacological induced ischemia.  Calculated ejection fraction is 57%.  Probable  diaphragmatic attenuation.   Electronically Signed  By: Markus Daft M.D.  On: 11/23/2013 15:10   Assessment/Plan: 1. Labile HTN - BP is great on his current regimen. I think most of the issue was from getting confused about taking medicines. Daughter is managing this now. I will see back in 3 months.   2. Dementia - suspect this is the etiology for what is going on at this time. I told him I did not think he should be driving anymore.   3. Bradycardia from Coreg - this has been stopped. HR ok today.    Current medicines are reviewed with the patient today.  The patient does not have concerns regarding medicines other than what has been noted above.  The following changes have been made:  See above.  Labs/ tests ordered today include:   No orders of the defined types were placed in this encounter.     Disposition:   FU with me in 3 months.   Patient is agreeable to this plan and will call if any problems develop in the interim.   Signed: Burtis Junes, RN, ANP-C 09/05/2015 3:06 PM  [facility] [address] [place], [place]  [address] Phone: [phone] Fax: [phone]

## 2015-09-05 NOTE — Patient Instructions (Addendum)
We will be checking the following labs today - NONE   Medication Instructions:    Continue with your current medicines.     Testing/Procedures To Be Arranged:  N/A  Follow-Up:   See me in 3 months    Other Special Instructions:   N/A    If you need a refill on your cardiac medications before your next appointment, please call your pharmacy.   Call the [facility] office at [phone] if you have any questions, problems or concerns.      

## 2015-09-06 NOTE — Addendum Note (Signed)
Addended by: Alvina Filbert B on: 09/06/2015 12:22 PM   Modules accepted: Orders

## 2015-09-21 ENCOUNTER — Telehealth: Payer: Self-pay | Admitting: Nurse Practitioner

## 2015-09-21 NOTE — Telephone Encounter (Signed)
New message      Pt c/o BP issue: STAT if pt c/o blurred vision, one-sided weakness or slurred speech  1. What are your last 5 BP readings? 198/112, 220/125 last night with HR 34, 187/109 HR 50'ish this am 2. Are you having any other symptoms (ex. Dizziness, headache, blurred vision, passed out)? no 3. What is your BP issue? Calling to let Cecille Rubin know patient's bp has been high

## 2015-09-21 NOTE — Telephone Encounter (Signed)
Spoke with daughter and she states that pt's BP last night 198/112, two nights ago 220/125. BP this morning before meds was 187/109, HR 50. Pt took his meds and then rechecked his BP at 11AM and it was 119/79, HR 63. Daughter states that pt denies any symptoms. Spoke with pt to verify if BP's are being taken before or after meds. Pt states that he take Lisinopril in AM and then takes another in the afternoon, takes Amlodipine in the AM. Asked pt if he prefer I call him back and he said no, it was fine to call his daughter. Spoke with Almyra Deforest, PA-C and he said to have pt take his BP BID- 1) 2 hrs after AM meds, 2) at [facility] and if SBP greater than 180, have pt take an extra Amlodipine. Spoke with pt's daughter and informed her of new instructions per Almyra Deforest, PA-C. Advised daughter to call our office if BP's continue to be elevated or if he is consistently having to take the extra Amlodipine. Daughter verbalized understanding and was in agreement with this plan.

## 2015-11-09 ENCOUNTER — Telehealth: Payer: Self-pay | Admitting: Nurse Practitioner

## 2015-11-09 NOTE — Telephone Encounter (Signed)
New message   Pt is calling for Cecille Rubin  About his prescription Amlodipine,  and his other doctor wants him to take it   Pt dont know which directions to listen to

## 2015-11-09 NOTE — Telephone Encounter (Signed)
Hi Ladies,  Sorry to give this to you however, Nicki Reaper has never seen this pt before so not sure why sent to me. Looks like Tera Helper, NP see's pt.  Thank you Lynne Logan at 11/09/2015 1:45 PM     Status: Signed       Expand All Collapse All   New message   Pt is calling for Cecille Rubin  About his prescription Amlodipine, and his other doctor wants him to take it   Pt dont know which directions to listen to

## 2015-11-21 ENCOUNTER — Other Ambulatory Visit: Payer: Self-pay | Admitting: Urology

## 2015-11-21 DIAGNOSIS — C61 Malignant neoplasm of prostate: Secondary | ICD-10-CM

## 2015-11-21 DIAGNOSIS — C7951 Secondary malignant neoplasm of bone: Secondary | ICD-10-CM

## 2015-12-05 ENCOUNTER — Ambulatory Visit (INDEPENDENT_AMBULATORY_CARE_PROVIDER_SITE_OTHER): Payer: Medicare Other | Admitting: Nurse Practitioner

## 2015-12-05 ENCOUNTER — Encounter: Payer: Self-pay | Admitting: Nurse Practitioner

## 2015-12-05 VITALS — BP 132/84 | HR 68 | Ht 69.0 in | Wt 149.8 lb

## 2015-12-05 DIAGNOSIS — I10 Essential (primary) hypertension: Secondary | ICD-10-CM | POA: Diagnosis not present

## 2015-12-05 DIAGNOSIS — E785 Hyperlipidemia, unspecified: Secondary | ICD-10-CM

## 2015-12-05 DIAGNOSIS — I259 Chronic ischemic heart disease, unspecified: Secondary | ICD-10-CM | POA: Diagnosis not present

## 2015-12-05 NOTE — Patient Instructions (Addendum)
We will be checking the following labs today - NONE   Medication Instructions:    Continue with your current medicines.     Testing/Procedures To Be Arranged:  N/A  Follow-Up:   See me in 6 months.     Other Special Instructions:   Keep a check on your blood pressure for me.     If you need a refill on your cardiac medications before your next appointment, please call your pharmacy.   Call the [facility] office at [phone] if you have any questions, problems or concerns.      

## 2015-12-05 NOTE — Progress Notes (Signed)
CARDIOLOGY OFFICE NOTE  Date:  12/05/2015    Craig [name] Date of Birth: Dec 26, 1925 Medical Record [id number]  PCP:  Myriam Jacobson, MD  Cardiologist:  Former patient of Dr. Kae Heller    Chief Complaint  Patient presents with  . Coronary Artery Disease  . Hypertension    3 month check - former patient of Dr. Kae Heller    History of Present Illness: Craig [name] is a 82 y.o. male who presents today for a 3 month check. Former patient of Dr. Kae Heller.   He has a history of HTN, presumed CAD with past NSTEMI from March of 2015 - negative Myoview, OA, prostate cancer, GERD and HLD.   Seen by Dr. Ron Parker in March of 2015 as a consult in the hospital - had had some shortness of breath and a cough - slightly elevated troponin - felt to be due to demand ischemia in the setting of pneumonia. Myoview normal at that time - no further evaluation felt to be needed. He was treated for pneumonia.  I saw him after an ER visit for elevated BP - he had had too much salt and was using NSAID. Last seen by Dr. Ron Parker in November of 2015 and was felt to be doing ok.   Presented to the ER back in December of 2016 with an elevated BP. I saw him in follow up - very labile BP but seemed to be related to not taking his medicines as he should have been. Lots of changes had been made. He was on Coreg - HR low and BP would be high. His med list did not correlate with ours. I tried to get it straightened out and we agreed to let his daughter and staff oversee his medicines. I stopped the Coreg. Split dosed the ACE and cut the Norvasc but given the ok to use extra prn. Daughter concerned about some confusion/possible dementia.   Last seen back in January and he was doing ok. Daughter was managing his medicines and this was going well.   Comes back today. Here with daughter again today. Feels like he is doing ok. No chest pain. Breathing ok. No falls. Eating less. Weight is down 6 pounds. Does not like the  diet at [facility] - daughter says he has gotten "picky". Tolerating his medicines. Does not seem to have any issues.   Past Medical History  Diagnosis Date  . Hypertension   . Degenerative disc disease   . Degenerative joint disease   . Genital herpes   . Diverticulitis   . Prostate cancer ([place]) 2012    mets to bone  . Lumbosacral spondylolysis   . Hip arthritis 09/29/2013    Bilateral  . Pneumonia 11/24/13  . Other and unspecified hyperlipidemia   . Malignant neoplasm of prostate ([place])   . GERD (gastroesophageal reflux disease)   . Cervicalgia   . Anxiety state, unspecified   . Diverticulosis   . TIA (transient ischemic attack)   . Lumbago   . Carbon monoxide exposure 11/24/13  . Hypoxia 11/24/13  . Hyponatremia 11/24/13  . NSTEMI (non-ST elevated myocardial infarction) ([place]) 11/20/13  . CAD (coronary artery disease) 11/20/13  . Genital herpes 07/10/2013  . Insomnia 07/09/2013  . Postoperative anemia due to acute blood loss 06/22/2013  . OA (osteoarthritis) of knee 06/21/2013  . Lumbosacral spondylosis without myelopathy 09/29/2013  . Ejection fraction     Past Surgical History  Procedure Laterality Date  . Total  knee arthroplasty Right 06/21/2013    Procedure: RIGHT TOTAL KNEE ARTHROPLASTY;  Surgeon: Gearlean Alf, MD;  Location: [facility];  Service: Orthopedics;  Laterality: Right;. Dr. Wynelle Link  . Mastoidectomy Bilateral   . Cataract extraction Bilateral   . Tonsillectomy       Medications: Current Outpatient Prescriptions  Medication Sig Dispense Refill  . acyclovir (ZOVIRAX) 200 MG capsule Take 200 mg by mouth daily.     Marland Kitchen amLODipine (NORVASC) 5 MG tablet Take 1 tablet (5 mg total) by mouth daily. May take an extra half a tablet prn SBP over 170 45 tablet 6  . B Complex-C (B-COMPLEX WITH VITAMIN C) tablet Take 1 tablet by mouth daily.    . bicalutamide (CASODEX) 50 MG tablet Take 50 mg by mouth daily.    . Calcium Carbonate-Vitamin D (CALCIUM + D PO) Take 2 tablets by  mouth daily.     . chlorhexidine (PERIDEX) 0.12 % solution Use as directed 5 mLs in the mouth or throat daily.     . clopidogrel (PLAVIX) 75 MG tablet Take 75 mg by mouth daily.    Marland Kitchen denosumab (XGEVA) 120 MG/1.7ML SOLN injection Inject 120 mg into the skin.     Marland Kitchen leuprolide (LUPRON) 30 MG injection Inject 30 mg into the muscle every 30 (thirty) days.     Marland Kitchen lisinopril (PRINIVIL,ZESTRIL) 40 MG tablet Take 0.5 tablets (20 mg total) by mouth 2 (two) times daily. 60 tablet 6  . LORazepam (ATIVAN) 0.5 MG tablet Take 1 tablet (0.5 mg total) by mouth at bedtime as needed for sleep. 10 tablet 0  . memantine (NAMENDA) 5 MG tablet Take 5 mg by mouth daily.    Marland Kitchen omeprazole (PRILOSEC) 20 MG capsule Take 20 mg by mouth daily as needed (GERD).     Vladimir Faster Glycol-Propyl Glycol (SYSTANE OP) Apply 1 drop to eye 2 (two) times daily as needed (dry eyes).    . Probiotic Product (PROBIOTIC ADVANCED PO) Take by mouth daily.     No current facility-administered medications for this visit.    Allergies: Allergies  Allergen Reactions  . Penicillins Other (See Comments)    unknown    Social History: The patient  reports that he quit smoking about 31 years ago. He has never used smokeless tobacco. He reports that he drinks about 1.2 oz of alcohol per week.   Family History: The patient's family history includes Heart disease in his mother.   Review of Systems: Please see the history of present illness.   Otherwise, the review of systems is positive for none.   All other systems are reviewed and negative.   Physical Exam: VS:  BP 132/84 mmHg  Pulse 68  Ht 5\' 9"  (1.753 m)  Wt 149 lb 12.8 oz (67.949 kg)  BMI 22.11 kg/m2 .  BMI Body mass index is 22.11 kg/(m^2).  Wt Readings from Last 3 Encounters:  12/05/15 149 lb 12.8 oz (67.949 kg)  09/05/15 155 lb 12.8 oz (70.67 kg)  09/01/15 155 lb (70.308 kg)    General: Pleasant. Elderly male who is alert and in no acute distress. He is thinner.   HEENT:  Normal. Neck: Supple, no JVD, carotid bruits, or masses noted.  Cardiac: Regular rate and rhythm. Just trace edema.  Respiratory:  Lungs are clear to auscultation bilaterally with normal work of breathing.  GI: Soft and nontender.  MS: No deformity or atrophy. Gait and ROM intact. Skin: Warm and dry. Color is normal.  Neuro:  Strength and sensation are intact and no gross focal deficits noted.  Psych: Alert, appropriate and with normal affect.   LABORATORY DATA:  EKG:  EKG is not ordered today.  Lab Results  Component Value Date   WBC 7.7 09/01/2015   HGB 13.0 09/01/2015   HCT 37.6* 09/01/2015   PLT 200 09/01/2015   GLUCOSE 104* 09/01/2015   CHOL 132 11/22/2013   TRIG 55 11/22/2013   HDL 77 11/22/2013   LDLCALC 44 11/22/2013   ALT 12 09/01/2015   AST 16 09/01/2015   NA 141 09/01/2015   K 4.1 09/01/2015   CL 105 09/01/2015   CREATININE 1.14* 09/01/2015   BUN 20 09/01/2015   CO2 28 09/01/2015   TSH 1.264 09/01/2015   PSA 0.23 03/16/2013   INR 1.20 11/23/2013   HGBA1C 5.5 06/24/2013    BNP (last 3 results) No results for input(s): BNP in the last 8760 hours.  ProBNP (last 3 results) No results for input(s): PROBNP in the last 8760 hours.   Other Studies Reviewed Today:  Echo Study Conclusions from 10/2013  Left ventricle: The cavity size was normal. Wall thickness was normal. Systolic function was normal. The estimated ejection fraction was in the range of 60% to 65%. Wall motion was normal; there were no regional wall motion abnormalities. Left ventricular diastolic function parameters were normal.  Impressions:  - No cardiac source of emboli was indentified.   MYOVIEW FINDINGS FROM 10/2013: Decreased uptake along the inferior wall on both the rest and stress images. This finding could be related to diaphragmatic attenuation. There may be gut uptake on the stress images. There is no evidence for a reversible defect. The left ventricle wall motion is  within normal limits, including the inferior wall. Calculated ejection fraction is 57%. End-diastolic volume is 81 ml and end systolic volume is 35 ml.  IMPRESSION: No evidence for pharmacological induced ischemia.  Calculated ejection fraction is 57%.  Probable diaphragmatic attenuation.   Electronically Signed  By: Markus Daft M.D.  On: 11/23/2013 15:10   Assessment/Plan: 1. Labile HTN - BP is great on his current regimen. Fairly good outpatient control at home.  I think most of the issue in th past were from getting confused about taking medicines. Daughter is managing this now. I will see back in 6 months.   2. Dementia - suspect this is the etiology for what was going on in the past.   3. Bradycardia from Coreg - this was stopped last year - HR has done ok.   Current medicines are reviewed with the patient today.  The patient does not have concerns regarding medicines other than what has been noted above.  The following changes have been made:  See above.  Labs/ tests ordered today include:   No orders of the defined types were placed in this encounter.     Disposition:   FU with me in 6 months.    Patient is agreeable to this plan and will call if any problems develop in the interim.   Signed: Burtis Junes, RN, ANP-C 12/05/2015 2:05 PM  [facility] [place] [address] [place], [place]  [address] Phone: [phone] Fax: [phone]

## 2015-12-27 ENCOUNTER — Ambulatory Visit (HOSPITAL_COMMUNITY)
Admission: RE | Admit: 2015-12-27 | Discharge: 2015-12-27 | Disposition: A | Discharge status: Home / Self Care | Payer: Medicare Other | Source: Ambulatory Visit | Attending: Urology | Admitting: Urology

## 2015-12-27 ENCOUNTER — Encounter (HOSPITAL_COMMUNITY)
Admission: RE | Admit: 2015-12-27 | Discharge: 2015-12-27 | Disposition: A | Discharge status: Home / Self Care | Payer: Medicare Other | Source: Ambulatory Visit | Attending: Urology | Admitting: Urology

## 2015-12-27 DIAGNOSIS — C7951 Secondary malignant neoplasm of bone: Secondary | ICD-10-CM | POA: Insufficient documentation

## 2015-12-27 DIAGNOSIS — R938 Abnormal findings on diagnostic imaging of other specified body structures: Secondary | ICD-10-CM | POA: Diagnosis not present

## 2015-12-27 DIAGNOSIS — C61 Malignant neoplasm of prostate: Secondary | ICD-10-CM | POA: Insufficient documentation

## 2015-12-27 MED ORDER — TECHNETIUM TC 99M MEDRONATE IV KIT
25.0000 | PACK | Freq: Once | INTRAVENOUS | Status: AC | PRN
  Administered 2015-12-27: 25.7 via INTRAVENOUS

## 2016-01-29 ENCOUNTER — Encounter: Payer: Self-pay | Admitting: Oncology

## 2016-02-06 ENCOUNTER — Telehealth: Payer: Self-pay | Admitting: Oncology

## 2016-02-06 NOTE — Telephone Encounter (Signed)
returned call and lvm confirming appt.... °

## 2016-02-07 ENCOUNTER — Ambulatory Visit: Payer: Medicare Other | Admitting: Oncology

## 2016-02-09 ENCOUNTER — Telehealth: Payer: Self-pay | Admitting: Oncology

## 2016-02-09 NOTE — Telephone Encounter (Signed)
Pt's dtr aware of np appt on 02/16/16@1 :30

## 2016-02-16 ENCOUNTER — Telehealth: Payer: Self-pay | Admitting: Oncology

## 2016-02-16 ENCOUNTER — Ambulatory Visit (HOSPITAL_BASED_OUTPATIENT_CLINIC_OR_DEPARTMENT_OTHER): Payer: Medicare Other | Admitting: Oncology

## 2016-02-16 VITALS — BP 129/64 | HR 57 | Temp 97.5°F | Resp 18 | Ht 69.0 in | Wt 148.8 lb

## 2016-02-16 DIAGNOSIS — C61 Malignant neoplasm of prostate: Secondary | ICD-10-CM | POA: Diagnosis present

## 2016-02-16 DIAGNOSIS — C7951 Secondary malignant neoplasm of bone: Secondary | ICD-10-CM

## 2016-02-16 DIAGNOSIS — M898X9 Other specified disorders of bone, unspecified site: Secondary | ICD-10-CM | POA: Diagnosis not present

## 2016-02-16 NOTE — Progress Notes (Signed)
Hematology and Oncology Follow Up Visit  Craig [name] ZN:[id number] 18-Jun-1926 82 y.o. 02/16/2016 2:01 PM   Principle Diagnosis: 82 year old gentleman with prostate cancer diagnosed in January of 2012. It a Gleason score 4+5 = 9.  Clinical stage TIIC. He has advanced disease to the bone   Current therapy:  He has been on Craig [name] since  10/22/2010 and subsequently Lupron on 11/27/2010 and has been on it since that time.  Casodex added on 01/06/2013. He started [place] in June of 2012  Interim History:  Craig [name] presents today for a followup visit. He is a pleasant gentleman I have seen periodically but none since 2014. He has history of advanced prostate cancer and have been receiving androgen deprivation therapy with Lupron and Casodex. His most recent PSA on 12/27/2015 was 0.64 which is slightly elevated from previously. His testosterone was 123 and based on these findings he was switched to Craig [name] to be received monthly. His most recent bone scan was on 12/27/2015 which did not show any dramatic changes in his bone disease.  Clinically, he has been doing reasonably well. He currently resides at a senior living facility and ambulates with the help of a walker. He does have chronic back pain unrelated to his prostate cancer. His performance status is limited but not dramatically changed. His appetite remained excellent. He denied any recent hospitalization or illnesses.  He does not report any headaches, blurry vision, syncope or seizures. He does not report any fevers, chills or sweats. He does not report any cough, wheezing or hemoptysis. Does not report any nausea, vomiting, abdominal pain. He does not report any constipation or diarrhea. He does not report any frequency urgency or hesitancy. Does not report any lymphadenopathy or petechiae. Remaining review of systems unremarkable.  Medications: I have reviewed the patient's current medications.  Current Outpatient Prescriptions   Medication Sig Dispense Refill  . acyclovir (ZOVIRAX) 200 MG capsule Take 200 mg by mouth daily.     Marland Kitchen amLODipine (NORVASC) 5 MG tablet Take 1 tablet (5 mg total) by mouth daily. May take an extra half a tablet prn SBP over 170 45 tablet 6  . B Complex-C (B-COMPLEX WITH VITAMIN C) tablet Take 1 tablet by mouth daily.    . bicalutamide (CASODEX) 50 MG tablet Take 50 mg by mouth daily.    . Calcium Carbonate-Vitamin D (CALCIUM + D PO) Take 2 tablets by mouth daily.     . chlorhexidine (PERIDEX) 0.12 % solution Use as directed 5 mLs in the mouth or throat daily.     . clopidogrel (PLAVIX) 75 MG tablet Take 75 mg by mouth daily.    Marland Kitchen denosumab (XGEVA) 120 MG/1.7ML SOLN injection Inject 120 mg into the skin.     Marland Kitchen leuprolide (LUPRON) 30 MG injection Inject 30 mg into the muscle every 30 (thirty) days.     Marland Kitchen lisinopril (PRINIVIL,ZESTRIL) 40 MG tablet Take 0.5 tablets (20 mg total) by mouth 2 (two) times daily. 60 tablet 6  . LORazepam (ATIVAN) 0.5 MG tablet Take 1 tablet (0.5 mg total) by mouth at bedtime as needed for sleep. 10 tablet 0  . memantine (NAMENDA) 5 MG tablet Take 5 mg by mouth daily.    Marland Kitchen omeprazole (PRILOSEC) 20 MG capsule Take 20 mg by mouth daily as needed (GERD).     Craig [name] Glycol-Propyl Glycol (SYSTANE OP) Apply 1 drop to eye 2 (two) times daily as needed (dry eyes).    . Probiotic Product (PROBIOTIC ADVANCED PO)  Take by mouth daily.     No current facility-administered medications for this visit.    Allergies:  Allergies  Allergen Reactions  . Penicillins Other (See Comments)    unknown    Past Medical History, Surgical history, Social history, and Family History were reviewed and updated.    Physical Exam: Blood pressure 129/64, pulse 57, temperature 97.5 F (36.4 C), temperature source Oral, resp. rate 18, height 5\' 9"  (1.753 m), weight 148 lb 12.8 oz (67.495 kg), SpO2 95 %. ECOG: 1 General appearance: alert, awake gentleman appeared without distress. Head:  Normocephalic, without obvious abnormality Neck: no adenopathy Lymph nodes: Cervical, supraclavicular, and axillary nodes normal. Heart:regular rate and rhythm, S1, S2 normal, no murmur, click, rub or gallop Lung:chest clear, no wheezing, rales, normal symmetric air entry. Abdomen: soft, non-tender, without masses or organomegaly no shifting dullness or ascites. EXT:no erythema, induration, or nodules   Lab Results: Lab Results  Component Value Date   WBC 7.7 09/01/2015   HGB 13.0 09/01/2015   HCT 37.6* 09/01/2015   MCV 96.7 09/01/2015   PLT 200 09/01/2015     Chemistry      Component Value Date/Time   NA 141 09/01/2015 1446   NA 137 12/06/2013   NA 141 12/15/2012 0934   K 4.1 09/01/2015 1446   K 4.0 12/15/2012 0934   CL 105 09/01/2015 1446   CL 100 12/15/2012 0934   CO2 28 09/01/2015 1446   CO2 30* 12/15/2012 0934   BUN 20 09/01/2015 1446   BUN 14 12/06/2013   BUN 18.5 12/15/2012 0934   CREATININE 1.14* 09/01/2015 1446   CREATININE 0.90 12/18/2014 2030   CREATININE 0.9 12/06/2013   CREATININE 1.0 12/15/2012 0934   GLU 88 12/06/2013      Component Value Date/Time   CALCIUM 9.3 09/01/2015 1446   CALCIUM 9.8 12/15/2012 0934   ALKPHOS 30* 09/01/2015 1446   ALKPHOS 37* 12/15/2012 0934   AST 16 09/01/2015 1446   AST 20 12/15/2012 0934   ALT 12 09/01/2015 1446   ALT 19 12/15/2012 0934   BILITOT 0.6 09/01/2015 1446   BILITOT 0.65 12/15/2012 0934        EXAM: NUCLEAR MEDICINE WHOLE BODY BONE SCAN  TECHNIQUE: Whole body anterior and posterior images were obtained approximately 3 hours after intravenous injection of radiopharmaceutical.  RADIOPHARMACEUTICALS: 25.7 MCi Technetium-53m MDP IV  COMPARISON: 11/25/2014.  FINDINGS: Intense activity noted over the left flank. This could be intense activity in the left kidney possibly related to delayed excretion from hydronephrosis. Renal ultrasound suggest for further evaluation. Focal area of increased  uptake noted in the region of the mid to lower thoracic spine. MRI of the thoracic spinal with gadolinium enhancement suggested to evaluate for metastatic disease. Small focus of increased activity noted over the left iliac crest. AP view of the pelvis suggested for further evaluation. Mild activity increased activity noted over the shoulders and sternoclavicular joint on the right, most likely degenerative. Mild increased activity of maxilla most likely related dental disease and/or sinus disease. Right knee replacement again noted.  IMPRESSION: 1. Intense activity noted the region of the left flank. This could possibly represent delayed activity in the left kidney possibly from hydronephrosis. Renal ultrasound suggest for further evaluation.  2. Intense activity noted over the mid to lower thoracic spine. Gadolinium-enhanced MRI of the thoracic spine suggested to exclude metastatic disease.  3. Punctate area of increased activity noted over the left iliac crest. AP view of the pelvis suggested for  further evaluation.    Impression and Plan:    82 year old gentleman with the following issues:  1. Advanced prostate cancer diagnosed in January 2012. He had a Gleason score 4+5 = 9 and at least 4 cores. All 12 cores were involved with his cancer. He had bony metastasis at the time.  He has been on androgen deprivation since that time and his disease appears to be hormone sensitive. His most recent PSA was up to 0.64 with testosterone level of 123. He was recently switched her Craig [name].  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 If his PSA continues to rise despite castrate level testosterone, different salvage therapy will be utilized. These options include Nicki Reaper, chemotherapy and Xofigo. The rationale for using these agents at different times were reviewed and it would be a reasonable  candidate to use Zytiga. Written information was given to the patient and would be the next option if his PSA continues to rise moving forward.  I will evaluate him in 2 months after his next PSA and we'll determine the next step at that time.  2. Bony disease: He is currently on Xgeva. Doing well with that at the time being given at Dr. Diona Fanti  3. Bony pain most likely arthritic in nature continue to be stable at this time. No major changes last 4 years.  4. Follow-up: In 2 months.  Adalynd Donahoe 6/23/20172:01 PM

## 2016-02-16 NOTE — Telephone Encounter (Signed)
Gave pt cal & avs °

## 2016-04-16 ENCOUNTER — Ambulatory Visit (HOSPITAL_BASED_OUTPATIENT_CLINIC_OR_DEPARTMENT_OTHER): Payer: Medicare Other | Admitting: Oncology

## 2016-04-16 ENCOUNTER — Telehealth: Payer: Self-pay | Admitting: Oncology

## 2016-04-16 VITALS — BP 146/82 | HR 57 | Temp 97.6°F | Resp 18 | Ht 69.0 in | Wt 148.3 lb

## 2016-04-16 DIAGNOSIS — C7951 Secondary malignant neoplasm of bone: Secondary | ICD-10-CM

## 2016-04-16 DIAGNOSIS — E291 Testicular hypofunction: Secondary | ICD-10-CM

## 2016-04-16 DIAGNOSIS — C61 Malignant neoplasm of prostate: Secondary | ICD-10-CM

## 2016-04-16 NOTE — Telephone Encounter (Signed)
Gave patient avs report and appointments for December  °

## 2016-04-16 NOTE — Progress Notes (Signed)
Hematology and Oncology Follow Up Visit  Craig [name] [id number] 1925-09-09 83 y.o. 04/16/2016 12:10 PM   Principle Diagnosis: 82 year old gentleman with prostate cancer diagnosed in January of 2012. It a Gleason score 4+5 = 9.  Clinical stage TIIC. He has advanced disease to the bone   Current therapy:  He has been on [place] since  10/22/2010 and subsequently Lupron on 11/27/2010 and has been on it since that time.  Casodex added on 01/06/2013. He started [place] in June of 2012  Interim History:  Craig [name] presents today for a followup visit with his daughter. Since his last visit, he reports no major changes. He denied any pathological fractures, falls or syncope. He does report chronic back pain which is unchanged. He currently resides at a senior living facility and ambulates with the help of a walker. His performance status is limited but not dramatically changed. His appetite remained excellent. He denied any recent hospitalization or illnesses.  He does not report any headaches, blurry vision, syncope or seizures. He does not report any fevers, chills or sweats. He does not report any cough, wheezing or hemoptysis. Does not report any nausea, vomiting, abdominal pain. He does not report any constipation or diarrhea. He does not report any frequency urgency or hesitancy. Does not report any lymphadenopathy or petechiae. Remaining review of systems unremarkable.  Medications: I have reviewed the patient's current medications.  Current Outpatient Prescriptions  Medication Sig Dispense Refill  . acyclovir (ZOVIRAX) 200 MG capsule Take 200 mg by mouth daily.     Marland Kitchen amLODipine (NORVASC) 5 MG tablet Take 1 tablet (5 mg total) by mouth daily. May take an extra half a tablet prn SBP over 170 45 tablet 6  . B Complex-C (B-COMPLEX WITH VITAMIN C) tablet Take 1 tablet by mouth daily.    . bicalutamide (CASODEX) 50 MG tablet Take 50 mg by mouth daily.    . Calcium Carbonate-Vitamin D (CALCIUM  + D PO) Take 2 tablets by mouth daily.     . chlorhexidine (PERIDEX) 0.12 % solution Use as directed 5 mLs in the mouth or throat daily.     . clopidogrel (PLAVIX) 75 MG tablet Take 75 mg by mouth daily.    Marland Kitchen denosumab (XGEVA) 120 MG/1.7ML SOLN injection Inject 120 mg into the skin.     Marland Kitchen leuprolide (LUPRON) 30 MG injection Inject 30 mg into the muscle every 30 (thirty) days.     Marland Kitchen lisinopril (PRINIVIL,ZESTRIL) 40 MG tablet Take 0.5 tablets (20 mg total) by mouth 2 (two) times daily. 60 tablet 6  . LORazepam (ATIVAN) 0.5 MG tablet Take 1 tablet (0.5 mg total) by mouth at bedtime as needed for sleep. 10 tablet 0  . memantine (NAMENDA) 5 MG tablet Take 5 mg by mouth daily.    Marland Kitchen omeprazole (PRILOSEC) 20 MG capsule Take 20 mg by mouth daily as needed (GERD).     Vladimir Faster Glycol-Propyl Glycol (SYSTANE OP) Apply 1 drop to eye 2 (two) times daily as needed (dry eyes).    . Probiotic Product (PROBIOTIC ADVANCED PO) Take by mouth daily.     No current facility-administered medications for this visit.     Allergies:  Allergies  Allergen Reactions  . Penicillins Other (See Comments)    Patient doesn't remember what kind of reaction    Past Medical History, Surgical history, Social history, and Family History were reviewed and updated.    Physical Exam: Blood pressure (!) 146/82, pulse (!) 57, temperature 97.6  F (36.4 C), temperature source Oral, resp. rate 18, height 5\' 9"  (1.753 m), weight 148 lb 4.8 oz (67.3 kg), SpO2 97 %. ECOG: 1 General appearance: Well-appearing gentleman appeared without distress. Head: Normocephalic, without obvious abnormality oral ulcers or lesions. Neck: no adenopathy Lymph nodes: Cervical, supraclavicular, and axillary nodes normal. Heart:regular rate and rhythm, S1, S2 normal, no murmur, click, rub or gallop Lung:chest clear, no wheezing, rales, normal symmetric air entry. Abdomen: soft, non-tender, without masses or organomegaly no melena or  guarding.. EXT:no erythema, induration, or nodules   Lab Results: Lab Results  Component Value Date   WBC 7.7 09/01/2015   HGB 13.0 09/01/2015   HCT 37.6 (L) 09/01/2015   MCV 96.7 09/01/2015   PLT 200 09/01/2015     Chemistry      Component Value Date/Time   NA 141 09/01/2015 1446   NA 137 12/06/2013   NA 141 12/15/2012 0934   K 4.1 09/01/2015 1446   K 4.0 12/15/2012 0934   CL 105 09/01/2015 1446   CL 100 12/15/2012 0934   CO2 28 09/01/2015 1446   CO2 30 (H) 12/15/2012 0934   BUN 20 09/01/2015 1446   BUN 14 12/06/2013   BUN 18.5 12/15/2012 0934   CREATININE 1.14 (H) 09/01/2015 1446   CREATININE 1.0 12/15/2012 0934   GLU 88 12/06/2013      Component Value Date/Time   CALCIUM 9.3 09/01/2015 1446   CALCIUM 9.8 12/15/2012 0934   ALKPHOS 30 (L) 09/01/2015 1446   ALKPHOS 37 (L) 12/15/2012 0934   AST 16 09/01/2015 1446   AST 20 12/15/2012 0934   ALT 12 09/01/2015 1446   ALT 19 12/15/2012 0934   BILITOT 0.6 09/01/2015 1446   BILITOT 0.65 12/15/2012 0934     Last PSA was 0.64 in May 2017.   EXAM: NUCLEAR MEDICINE WHOLE BODY BONE SCAN  TECHNIQUE: Whole body anterior and posterior images were obtained approximately 3 hours after intravenous injection of radiopharmaceutical.  RADIOPHARMACEUTICALS: 25.7 MCi Technetium-59m MDP IV  COMPARISON: 11/25/2014.  FINDINGS: Intense activity noted over the left flank. This could be intense activity in the left kidney possibly related to delayed excretion from hydronephrosis. Renal ultrasound suggest for further evaluation. Focal area of increased uptake noted in the region of the mid to lower thoracic spine. MRI of the thoracic spinal with gadolinium enhancement suggested to evaluate for metastatic disease. Small focus of increased activity noted over the left iliac crest. AP view of the pelvis suggested for further evaluation. Mild activity increased activity noted over the shoulders and sternoclavicular joint on  the right, most likely degenerative. Mild increased activity of maxilla most likely related dental disease and/or sinus disease. Right knee replacement again noted.  IMPRESSION: 1. Intense activity noted the region of the left flank. This could possibly represent delayed activity in the left kidney possibly from hydronephrosis. Renal ultrasound suggest for further evaluation.  2. Intense activity noted over the mid to lower thoracic spine. Gadolinium-enhanced MRI of the thoracic spine suggested to exclude metastatic disease.  3. Punctate area of increased activity noted over the left iliac crest. AP view of the pelvis suggested for further evaluation.    Impression and Plan:    82 year old gentleman with the following issues:  1. Advanced prostate cancer diagnosed in January 2012. He had a Gleason score 4+5 = 9 and at least 4 cores. All 12 cores were involved with his cancer. He had bony metastasis at the time.  He has been on androgen deprivation  since that time and his disease appears to be hormone sensitive. His most recent PSA was up to 0.64 with testosterone level of 123. He was recently switched her [place].  Options of therapy were reviewed today for her castration resistant metastatic prostate cancer. I have given him information about Fabio Asa which would be the next option if his PSA continues to rise. Complications and risks and benefits were reviewed again today. These complications include hypertension, lower extremity edema and hypokalemia. We also discussed the cost associated with this medication.  The distant continue observation and surveillance and monitor his PSA closely on androgen deprivation only. If his PSA continues to rise, he will consider starting Zytiga. He is concerned about the side effects associated with this medication and it is certainly reasonable to continue to withhold the start of therapy unless there is any reason to.  2. Bony disease: He is  currently on Xgeva. Doing well with that at the time being given at Dr. Diona Fanti  3. Bony pain most likely arthritic in nature continue to be stable at this time. No major changes last 4 years.  4. Follow-up: In 4 months.  [facility] 8/22/201712:10 PM

## 2016-04-25 ENCOUNTER — Encounter: Payer: Self-pay | Admitting: *Deleted

## 2016-05-10 ENCOUNTER — Encounter (HOSPITAL_COMMUNITY): Payer: Self-pay | Admitting: *Deleted

## 2016-05-10 ENCOUNTER — Emergency Department (HOSPITAL_COMMUNITY)
Admission: EM | Admit: 2016-05-10 | Discharge: 2016-05-10 | Disposition: A | Discharge status: Home / Self Care | Payer: Medicare Other | Attending: Emergency Medicine | Admitting: Emergency Medicine

## 2016-05-10 DIAGNOSIS — I2510 Atherosclerotic heart disease of native coronary artery without angina pectoris: Secondary | ICD-10-CM | POA: Diagnosis not present

## 2016-05-10 DIAGNOSIS — Z8546 Personal history of malignant neoplasm of prostate: Secondary | ICD-10-CM | POA: Insufficient documentation

## 2016-05-10 DIAGNOSIS — I10 Essential (primary) hypertension: Secondary | ICD-10-CM | POA: Diagnosis present

## 2016-05-10 DIAGNOSIS — Z87891 Personal history of nicotine dependence: Secondary | ICD-10-CM | POA: Diagnosis not present

## 2016-05-10 DIAGNOSIS — Z7982 Long term (current) use of aspirin: Secondary | ICD-10-CM | POA: Diagnosis not present

## 2016-05-10 DIAGNOSIS — Z79899 Other long term (current) drug therapy: Secondary | ICD-10-CM | POA: Insufficient documentation

## 2016-05-10 DIAGNOSIS — Z8679 Personal history of other diseases of the circulatory system: Secondary | ICD-10-CM

## 2016-05-10 DIAGNOSIS — R0989 Other specified symptoms and signs involving the circulatory and respiratory systems: Secondary | ICD-10-CM

## 2016-05-10 NOTE — ED Provider Notes (Signed)
[facility] DEPT Provider Note   CSN: [id number] Arrival date & time: 05/10/16  0026  History   Chief Complaint Chief Complaint  Patient presents with  . Hypertension   HPI   CLEAVELAND SEGA is an 82 y.o. male with history of HTN, CAD, prostate cancer, anxiety, who presents to the ED for evaluation of elevated and fluctuating blood pressure. He states today he has noticed "major fluctuations" in his BP at home (lives at [facility]). He has a written record of BP readings from today and has been checking it every 1-2 hrs since this morning. Readings range from [id number] systolic and apparently had maximum BP of 203/100. He states when his BP gets high he can "feel it." Denies chest pain or SOB. Denies headache, blurred vision, numbness, weakness. Denies feeling faint or lightheaded. Denies slurred speech or facial droop. Denies urinary problems. He has been taking his home BP meds as prescribed. He does admit he has been told he has "labile blood pressures" in the past. Has no complaints in the ED.   Past Medical History:  Diagnosis Date  . Anxiety state, unspecified   . CAD (coronary artery disease) 11/20/13  . Carbon monoxide exposure 11/24/13  . Cervicalgia   . Degenerative disc disease   . Degenerative joint disease   . Diverticulitis   . Diverticulosis   . Ejection fraction   . Genital herpes   . Genital herpes 07/10/2013  . GERD (gastroesophageal reflux disease)   . Hip arthritis 09/29/2013   Bilateral  . Hypertension   . Hyponatremia 11/24/13  . Hypoxia 11/24/13  . Insomnia 07/09/2013  . Lumbago   . Lumbosacral spondylolysis   . Lumbosacral spondylosis without myelopathy 09/29/2013  . Malignant neoplasm of prostate ([place])   . NSTEMI (non-ST elevated myocardial infarction) ([place]) 11/20/13  . OA (osteoarthritis) of knee 06/21/2013  . Other and unspecified hyperlipidemia   . Pneumonia 11/24/13  . Postoperative anemia due to acute blood loss 06/22/2013  . Prostate cancer ([place])  2012   mets to bone  . TIA (transient ischemic attack)     Patient Active Problem List   Diagnosis Date Noted  . Ejection fraction   . HCAP (healthcare-associated pneumonia) 11/27/2013  . Hypoxia 11/25/2013  . Carbon monoxide exposure 11/25/2013  . Carboxyhemoglobinemia 11/25/2013  . NSTEMI (non-ST elevated myocardial infarction) ([place]) 11/21/2013  . CAP (community acquired pneumonia) 11/21/2013  . Hyperlipidemia 11/21/2013  . Lumbosacral spondylosis without myelopathy 09/29/2013  . Hip arthritis 09/29/2013  . GERD (gastroesophageal reflux disease) 07/13/2013  . Genital herpes 07/10/2013  . Essential hypertension 07/10/2013  . Insomnia 07/09/2013  . TIA (transient ischemic attack) 06/24/2013  . Postoperative anemia due to acute blood loss 06/22/2013  . Hyponatremia 06/22/2013  . OA (osteoarthritis) of knee 06/21/2013  . Prostate cancer ([place]) 04/14/2012    Past Surgical History:  Procedure Laterality Date  . CATARACT EXTRACTION Bilateral   . MASTOIDECTOMY Bilateral   . TONSILLECTOMY    . TOTAL KNEE ARTHROPLASTY Right 06/21/2013   Procedure: RIGHT TOTAL KNEE ARTHROPLASTY;  Surgeon: Gearlean Alf, MD;  Location: [facility] ORS;  Service: Orthopedics;  Laterality: Right;. Dr. Wynelle Link       Home Medications    Prior to Admission medications   Medication Sig Start Date End Date Taking? Authorizing Provider  acyclovir (ZOVIRAX) 200 MG capsule Take 200 mg by mouth every morning.  08/27/11  Yes Historical Provider, MD  amLODipine (NORVASC) 5 MG tablet Take 1 tablet (5 mg total) by mouth  daily. May take an extra half a tablet prn SBP over 170 Patient taking differently: Take 2.5 mg by mouth 2 (two) times daily. May take an extra half a tablet prn SBP over 170 09/01/15  Yes Burtis Junes, NP  aspirin EC 81 MG tablet Take 40.5 mg by mouth every morning.    Yes Historical Provider, MD  atorvastatin (LIPITOR) 10 MG tablet Take 10 mg by mouth every morning.    Yes Historical Provider, MD  B  Complex-C (B-COMPLEX WITH VITAMIN C) tablet Take 1 tablet by mouth every morning.    Yes Historical Provider, MD  bicalutamide (CASODEX) 50 MG tablet Take 50 mg by mouth every morning.  09/16/13  Yes Historical Provider, MD  Calcium Carbonate-Vitamin D (CALCIUM + D PO) Take 2 tablets by mouth every morning.    Yes Historical Provider, MD  clopidogrel (PLAVIX) 75 MG tablet Take 75 mg by mouth every morning.    Yes Historical Provider, MD  denosumab (PROLIA) 60 MG/ML SOLN injection Inject 60 mg into the skin every 6 (six) months. Administer in upper arm, thigh, or abdomen   Yes Historical Provider, MD  denosumab (XGEVA) 120 MG/1.7ML SOLN injection Inject 120 mg into the skin every 30 (thirty) days.    Yes Historical Provider, MD  lisinopril (PRINIVIL,ZESTRIL) 40 MG tablet Take 0.5 tablets (20 mg total) by mouth 2 (two) times daily. 09/01/15  Yes Burtis Junes, NP  LORazepam (ATIVAN) 0.5 MG tablet Take 1 tablet (0.5 mg total) by mouth at bedtime as needed for sleep. 11/29/13  Yes Hosie Poisson, MD  omeprazole (PRILOSEC) 20 MG capsule Take 20 mg by mouth daily as needed (GERD).    Yes Historical Provider, MD  Polyethyl Glycol-Propyl Glycol (SYSTANE OP) Apply 1 drop to eye 2 (two) times daily as needed (dry eyes).   Yes Historical Provider, MD  Probiotic Product (PROBIOTIC ADVANCED PO) Take 1 tablet by mouth every morning.    Yes Historical Provider, MD    Family History Family History  Problem Relation Age of Onset  . Heart disease Mother     Social History Social History  Substance Use Topics  . Smoking status: Former Smoker    Packs/day: 2.00    Years: 15.00    Quit date: 01/14/1984  . Smokeless tobacco: Never Used  . Alcohol use 1.2 oz/week    2 Shots of liquor per week     Comment: occasional     Allergies   Penicillins   Review of Systems Review of Systems 10 Systems reviewed and are negative for acute change except as noted in the HPI.  Physical Exam Updated Vital Signs BP  173/93 (BP Location: Right Arm)   Pulse (!) 48   Temp 97.8 F (36.6 C) (Oral)   Resp 18   Ht 5\' 9"  (1.753 m)   Wt 65.8 kg   SpO2 96%   BMI 21.41 kg/m   Physical Exam  Constitutional: He is oriented to person, place, and time.  Elderly, well appearing, pleasant  HENT:  Right Ear: External ear normal.  Left Ear: External ear normal.  Nose: Nose normal.  Mouth/Throat: Oropharynx is clear and moist. No oropharyngeal exudate.  Eyes: Conjunctivae are normal.  Neck: Neck supple.  Cardiovascular: Normal rate, regular rhythm, normal heart sounds and intact distal pulses.   Pulmonary/Chest: Effort normal and breath sounds normal. No respiratory distress. He has no wheezes.  Abdominal: Soft. Bowel sounds are normal. He exhibits no distension. There is no tenderness.  There is no rebound and no guarding.  Musculoskeletal: He exhibits no edema.  Lymphadenopathy:    He has no cervical adenopathy.  Neurological: He is alert and oriented to person, place, and time. No cranial nerve deficit.  Skin: Skin is warm and dry.  Psychiatric: He has a normal mood and affect.  Nursing note and vitals reviewed.  ED Treatments / Results  Labs (all labs ordered are listed, but only abnormal results are displayed) Labs Reviewed - No data to display  EKG  EKG Interpretation None       Radiology No results found.  Procedures Procedures (including critical care time)  Medications Ordered in ED Medications - No data to display   Initial Impression / Assessment and Plan / ED Course  I have reviewed the triage vital signs and the nursing notes.  Pertinent labs & imaging results that were available during my care of the patient were reviewed by me and considered in my medical decision making (see chart for details).  Clinical Course    Pt is an 82 y.o. male presenting from his nursing home for evaluation of fluctuating BP. He has checked his BP many times today with concerns that his BP is  fluctuating from high to low. He has not specific complaints or symptoms associated with this. His BP in the ED has remained unremarkable. I encouraged pt to only check his BP once a day around the same time every day. If BP remains persistently elevated he may talk to his primary care provider about medication adjustment. Otherwise no further emergent workup necessary at this time. Encouraged close f/u with PCP. ER return precautions given.  Final Clinical Impressions(s) / ED Diagnoses   Final diagnoses:  History of hypertension  Labile blood pressure    New Prescriptions Discharge Medication List as of 05/10/2016  3:11 AM       Anne Ng, PA-C 05/10/16 1538    Shanon Rosser, MD 05/10/16 2252

## 2016-05-10 NOTE — Discharge Instructions (Signed)
Your blood pressures have been normal in the emergency room. As we discussed, I recommend checking your blood pressure only once a day around the same time each day. Follow up with your primary care provider as soon as possible.

## 2016-05-10 NOTE — ED Triage Notes (Signed)
Pt bib EMS from [facility] with concerns for BP. He has h/o HTN. Today, he noticed his blood pressure flucuate, BP today has been as high as 203/100 per staff.  Irreg. HR [place], PVC's,  in route on EKG monitor  Denies No pain

## 2016-05-10 NOTE — ED Notes (Signed)
Bed: WA01 Expected date:  Expected time:  Means of arrival:  Comments: EMS from [facility]-elevated blood pressure

## 2016-05-17 ENCOUNTER — Encounter: Payer: Self-pay | Admitting: Nurse Practitioner

## 2016-06-03 NOTE — Progress Notes (Signed)
CARDIOLOGY OFFICE NOTE  Date:  06/04/2016    Craig [name] Date of Birth: [birth year] Medical Record [id number]  PCP:  Myriam Jacobson, MD  Cardiologist:  Servando Snare   Chief Complaint  Patient presents with  . Hypertension    6 month check     History of Present Illness: Craig [name] is a 82 y.o. male who presents today for a 6 month check.  Former patient of Dr. Kae Heller - now following with me.   He has a history of HTN, presumed CAD with past NSTEMI from March of 2015 - negative Myoview, OA, prostate cancer, GERD and HLD.   Seen by Dr. Ron Parker in March of 2015 as a consult in the hospital - had had some shortness of breath and a cough - slightly elevated troponin - felt to be due to demand ischemia in the setting of pneumonia. Myoview normal at that time - no further evaluation felt to be needed. He was treated for pneumonia.  I saw him after an ER visit for elevated BP - he had had too much salt and was using NSAID. Last seen by Dr. Ron Parker in November of 2015 and was felt to be doing ok.   Presented to the ER back in December of 2016 with an elevated BP. I saw him in follow up - very labile BP but seemed to be related to not taking his medicines as he should have been. Lots of changes had been made. He was on Coreg - HR low and BP would be high. His med list did not correlate with ours. I tried to get it straightened out and we agreed to let his daughter and staff oversee his medicines. I stopped the Coreg. Split dosed the ACE and cut the Norvasc but given the ok to use extra prn. Daughter concerned about some confusion/possible dementia.   Last seen back in April and he was doing ok. Daughter was continuing to manage his medicines and this was going well. Weight was down - attributed from the diet at [facility] which he does not care for.  Comes back today. Here with daughter again today. He has taken an extra dose of Lisinopril and Amlodipine 45 minutes before  coming here - says he has been told to do this. Has not eaten lunch yet today. Ate some potato chips at his girlfriend's house a few days ago. He seems very confused about his medicines again - his daughter has let him go back to handling his own medicines. Sounds like he is taking extra halves of Lisinopril - has not been told to do this - along with the extra Norvasc. He has had more issues with his prostate - has had to have intermittent catheterizations - lots of doctor visits - daughter is exhausted. He has also lost some weight - has had trouble with his jaw - some type of infection - having to eat more soft foods. Hard to say how much salt he gets - he does eat the hot dogs that they have at [facility].   Past Medical History:  Diagnosis Date  . Anxiety state, unspecified   . CAD (coronary artery disease) 11/20/13  . Carbon monoxide exposure 11/24/13  . Cervicalgia   . Degenerative disc disease   . Degenerative joint disease   . Diverticulitis   . Diverticulosis   . Ejection fraction   . Genital herpes   . Genital herpes 07/10/2013  . GERD (gastroesophageal reflux  disease)   . Hip arthritis 09/29/2013   Bilateral  . Hypertension   . Hyponatremia 11/24/13  . Hypoxia 11/24/13  . Insomnia 07/09/2013  . Lumbago   . Lumbosacral spondylolysis   . Lumbosacral spondylosis without myelopathy 09/29/2013  . Malignant neoplasm of prostate ([place])   . NSTEMI (non-ST elevated myocardial infarction) ([place]) 11/20/13  . OA (osteoarthritis) of knee 06/21/2013  . Other and unspecified hyperlipidemia   . Pneumonia 11/24/13  . Postoperative anemia due to acute blood loss 06/22/2013  . Prostate cancer ([place]) 2012   mets to bone  . TIA (transient ischemic attack)     Past Surgical History:  Procedure Laterality Date  . CATARACT EXTRACTION Bilateral   . MASTOIDECTOMY Bilateral   . TONSILLECTOMY    . TOTAL KNEE ARTHROPLASTY Right 06/21/2013   Procedure: RIGHT TOTAL KNEE ARTHROPLASTY;  Surgeon: Gearlean Alf, MD;  Location: [facility];  Service: Orthopedics;  Laterality: Right;. Dr. Wynelle Link     Medications: Current Outpatient Prescriptions  Medication Sig Dispense Refill  . acyclovir (ZOVIRAX) 200 MG capsule Take 200 mg by mouth every morning.     Marland Kitchen amLODipine (NORVASC) 5 MG tablet Take 1 tablet (5 mg total) by mouth daily. May take an extra half a tablet prn SBP over 170 (Patient taking differently: Take 2.5 mg by mouth 2 (two) times daily. May take an extra half a tablet prn SBP over 170) 45 tablet 6  . aspirin EC 81 MG tablet Take 40.5 mg by mouth every morning.     Marland Kitchen atorvastatin (LIPITOR) 10 MG tablet Take 10 mg by mouth every morning.     . B Complex-C (B-COMPLEX WITH VITAMIN C) tablet Take 1 tablet by mouth every morning.     . bicalutamide (CASODEX) 50 MG tablet Take 50 mg by mouth every morning.     . Calcium Carbonate-Vitamin D (CALCIUM + D PO) Take 2 tablets by mouth every morning.     . clopidogrel (PLAVIX) 75 MG tablet Take 75 mg by mouth every morning.     . denosumab (PROLIA) 60 MG/ML SOLN injection Inject 60 mg into the skin every 6 (six) months. Administer in upper arm, thigh, or abdomen    . denosumab (XGEVA) 120 MG/1.7ML SOLN injection Inject 120 mg into the skin every 30 (thirty) days.     Marland Kitchen lisinopril (PRINIVIL,ZESTRIL) 40 MG tablet Take 0.5 tablets (20 mg total) by mouth 2 (two) times daily. 60 tablet 6  . LORazepam (ATIVAN) 0.5 MG tablet Take 1 tablet (0.5 mg total) by mouth at bedtime as needed for sleep. 10 tablet 0  . omeprazole (PRILOSEC) 20 MG capsule Take 20 mg by mouth daily as needed (GERD).     Vladimir Faster Glycol-Propyl Glycol (SYSTANE OP) Apply 1 drop to eye 2 (two) times daily as needed (dry eyes).    . Probiotic Product (PROBIOTIC ADVANCED PO) Take 1 tablet by mouth every morning.      No current facility-administered medications for this visit.     Allergies: Allergies  Allergen Reactions  . Penicillins Other (See Comments)    Patient doesn't remember  what kind of reaction  Has patient had a PCN reaction causing immediate rash, facial/tongue/throat swelling, SOB or lightheadedness with hypotension: n/a Has patient had a PCN reaction causing severe rash involving mucus membranes or skin necrosis:  N/a Has patient had a PCN reaction that required hospitalization: n/a Has patient had a PCN reaction occurring within the last 10 years: n/a If all  of the above answers are "NO", then may proceed with Cephalosporin use.     Social History: The patient  reports that he quit smoking about 32 years ago. He has a 30.00 pack-year smoking history. He has never used smokeless tobacco. He reports that he drinks about 1.2 oz of alcohol per week .   Family History: The patient's family history includes Heart disease in his mother.   Review of Systems: Please see the history of present illness.   Otherwise, the review of systems is positive for none.   All other systems are reviewed and negative.   Physical Exam: VS:  BP (!) 160/110   Pulse (!) 53   Ht 5\' 9"  (1.753 m)   Wt 144 lb 6.4 oz (65.5 kg)   SpO2 98%   BMI 21.32 kg/m  .  BMI Body mass index is 21.32 kg/m.  Wt Readings from Last 3 Encounters:  06/04/16 144 lb 6.4 oz (65.5 kg)  05/10/16 145 lb (65.8 kg)  04/16/16 148 lb 4.8 oz (67.3 kg)    General: Elderly. He is alert and in no acute distress. His weight is down 5 pounds.   HEENT: Normal.  Neck: Supple, no JVD, carotid bruits, or masses noted.  Cardiac: Regular rate and rhythm. No murmurs, rubs, or gallops. No edema.  Respiratory:  Lungs are clear to auscultation bilaterally with normal work of breathing.  GI: Soft and nontender.  MS: No deformity or atrophy. Gait and ROM intact.  Skin: Warm and dry. Color is normal.  Neuro:  Strength and sensation are intact and no gross focal deficits noted.  Psych: Alert, appropriate and with normal affect.   LABORATORY DATA:  EKG:  EKG is not ordered today.  Lab Results  Component Value  Date   WBC 7.7 09/01/2015   HGB 13.0 09/01/2015   HCT 37.6 (L) 09/01/2015   PLT 200 09/01/2015   GLUCOSE 104 (H) 09/01/2015   CHOL 132 11/22/2013   TRIG 55 11/22/2013   HDL 77 11/22/2013   LDLCALC 44 11/22/2013   ALT 12 09/01/2015   AST 16 09/01/2015   NA 141 09/01/2015   K 4.1 09/01/2015   CL 105 09/01/2015   CREATININE 1.14 (H) 09/01/2015   BUN 20 09/01/2015   CO2 28 09/01/2015   TSH 1.264 09/01/2015   PSA 0.23 03/16/2013   INR 1.20 11/23/2013   HGBA1C 5.5 06/24/2013    BNP (last 3 results) No results for input(s): BNP in the last 8760 hours.  ProBNP (last 3 results) No results for input(s): PROBNP in the last 8760 hours.   Other Studies Reviewed Today:  Echo Study Conclusions from 10/2013  Left ventricle: The cavity size was normal. Wall thickness was normal. Systolic function was normal. The estimated ejection fraction was in the range of 60% to 65%. Wall motion was normal; there were no regional wall motion abnormalities. Left ventricular diastolic function parameters were normal.  Impressions:  - No cardiac source of emboli was indentified.   MYOVIEW FINDINGS FROM 10/2013: Decreased uptake along the inferior wall on both the rest and stress images. This finding could be related to diaphragmatic attenuation. There may be gut uptake on the stress images. There is no evidence for a reversible defect. The left ventricle wall motion is within normal limits, including the inferior wall. Calculated ejection fraction is 57%. End-diastolic volume is 81 ml and end systolic volume is 35 ml.  IMPRESSION: No evidence for pharmacological induced ischemia.  Calculated ejection fraction  is 57%.  Probable diaphragmatic attenuation.   Electronically Signed  By: Markus Daft M.D.  On: 11/23/2013 15:10   Assessment/Plan: 1. Labile HTN - BP back off track - I think this is directly correlated to his family letting go of managing his medicines - daughter  admits that she knows she needs to monitor. We know that with this current regimen - his BP is controlled. No need to check multiple times a day.   2. Dementia - suspect this is the etiology for what was going on in the past.   3. Bradycardia from Coreg - this was stopped last year - HR has done ok.   4. Prostate issues  5. Advanced age - seems to be failing in a generalized fashion - we talked about assisted living - he is very resistant but we had a nice talk about it today.    Current medicines are reviewed with the patient today.  The patient does not have concerns regarding medicines other than what has been noted above.  The following changes have been made:  See above.  Labs/ tests ordered today include:    Orders Placed This Encounter  Procedures  . Basic metabolic panel  . CBC  . TSH     Disposition:   FU with me in 3 months.  Patient is agreeable to this plan and will call if any problems develop in the interim.   Signed: Burtis Junes, RN, ANP-C 06/04/2016 2:34 PM  [facility] [address] [place], [place]  [address] Phone: [phone] Fax: [phone]

## 2016-06-04 ENCOUNTER — Encounter: Payer: Self-pay | Admitting: Nurse Practitioner

## 2016-06-04 ENCOUNTER — Ambulatory Visit (INDEPENDENT_AMBULATORY_CARE_PROVIDER_SITE_OTHER): Payer: Medicare Other | Admitting: Nurse Practitioner

## 2016-06-04 ENCOUNTER — Encounter (INDEPENDENT_AMBULATORY_CARE_PROVIDER_SITE_OTHER): Payer: Self-pay

## 2016-06-04 VITALS — BP 160/110 | HR 53 | Ht 69.0 in | Wt 144.4 lb

## 2016-06-04 DIAGNOSIS — I259 Chronic ischemic heart disease, unspecified: Secondary | ICD-10-CM

## 2016-06-04 DIAGNOSIS — I10 Essential (primary) hypertension: Secondary | ICD-10-CM | POA: Diagnosis not present

## 2016-06-04 DIAGNOSIS — E7800 Pure hypercholesterolemia, unspecified: Secondary | ICD-10-CM

## 2016-06-04 LAB — CBC
HCT: 36.8 % — ABNORMAL LOW (ref 38.5–50.0)
Hemoglobin: 12.7 g/dL — ABNORMAL LOW (ref 13.2–17.1)
MCH: 33.5 pg — ABNORMAL HIGH (ref 27.0–33.0)
MCHC: 34.5 g/dL (ref 32.0–36.0)
MCV: 97.1 fL (ref 80.0–100.0)
MPV: 9.8 fL (ref 7.5–12.5)
Platelets: 240 10*3/uL (ref 140–400)
RBC: 3.79 MIL/uL — ABNORMAL LOW (ref 4.20–5.80)
RDW: 13.2 % (ref 11.0–15.0)
WBC: 7 10*3/uL (ref 3.8–10.8)

## 2016-06-04 LAB — BASIC METABOLIC PANEL
BUN: 19 mg/dL (ref 7–25)
CO2: 28 mmol/L (ref 20–31)
Calcium: 9.6 mg/dL (ref 8.6–10.3)
Chloride: 100 mmol/L (ref 98–110)
Creat: 0.96 mg/dL (ref 0.70–1.11)
Glucose, Bld: 73 mg/dL (ref 65–99)
Potassium: 4.6 mmol/L (ref 3.5–5.3)
Sodium: 138 mmol/L (ref 135–146)

## 2016-06-04 LAB — TSH: TSH: 1.45 mIU/L (ref 0.40–4.50)

## 2016-06-04 NOTE — Patient Instructions (Addendum)
We will be checking the following labs today - BMET, CBC and TSH   Medication Instructions:    Continue with your current medicines.     Testing/Procedures To Be Arranged:  N/A  Follow-Up:   See me in 3 months    Other Special Instructions:   N/A    If you need a refill on your cardiac medications before your next appointment, please call your pharmacy.   Call the [place] office at [phone] if you have any questions, problems or concerns.

## 2016-06-19 ENCOUNTER — Other Ambulatory Visit: Payer: Self-pay | Admitting: Nurse Practitioner

## 2016-06-19 MED ORDER — LISINOPRIL 40 MG PO TABS: 20.0000 mg | ORAL_TABLET | Freq: Two times a day (BID) | ORAL | 3 refills | Status: DC

## 2016-06-25 ENCOUNTER — Ambulatory Visit
Admission: RE | Admit: 2016-06-25 | Discharge: 2016-06-25 | Disposition: A | Discharge status: Home / Self Care | Payer: Medicare Other | Source: Ambulatory Visit | Attending: Internal Medicine | Admitting: Internal Medicine

## 2016-06-25 ENCOUNTER — Other Ambulatory Visit: Payer: Self-pay | Admitting: Internal Medicine

## 2016-06-25 DIAGNOSIS — R0781 Pleurodynia: Secondary | ICD-10-CM

## 2016-07-02 ENCOUNTER — Telehealth: Payer: Self-pay | Admitting: Nurse Practitioner

## 2016-07-02 NOTE — Telephone Encounter (Signed)
New Message  Pt call requesting to speak with RN about his current medication. Please call back to discuss

## 2016-07-02 NOTE — Telephone Encounter (Signed)
I have spoken with Mr. Craver daughter, Lattie Haw. She tells me that the BP has been running "normal" since last visit with me here and not running low at home. After the visit with Dr. Mancel Bale, Mr. Bradway did hold his evening dose of Norvasc - woke up in the middle of the night and BP was 99991111 systolic. He subsequently took the dose of Norvasc and BP came back down. BP is "normal" today. He feels fine. She would like to continue with his current regimen, continue to monitor and if readings go low at home, we will certainly need to make some adjustments. I agree with this plan for now.

## 2016-07-02 NOTE — Telephone Encounter (Signed)
S/w daughter per DPR pt was seen in Dr. Mancel Bale office yesterday and pt's bp was 110/80.  Dr. Mancel Bale stated pt has been on a downward trend and does not want BP this low. Daughter stated not changing any heart medications till pt's daughter talks with Kathrene Alu.  Dr. Mancel Bale stated to call [facility] in the am and tell Cecille Rubin to contact Dr.Robert's office to hash out medication changes.  Pt decided last night which was spoken to daughter this am to try and not take the other dose of amlodipine last night per Dr. Rose Fillers,  Pt woke up in the middle of the night with bp 190 and had to take the dose of amlodipine that was skipped at pm dose. Will route to [place] to advise.

## 2016-08-15 ENCOUNTER — Ambulatory Visit: Payer: Medicare Other | Admitting: Oncology

## 2016-08-22 ENCOUNTER — Ambulatory Visit: Payer: Medicare Other | Admitting: Oncology

## 2016-08-27 ENCOUNTER — Encounter (HOSPITAL_COMMUNITY): Payer: Self-pay

## 2016-08-27 ENCOUNTER — Emergency Department (HOSPITAL_COMMUNITY): Payer: Medicare Other

## 2016-08-27 ENCOUNTER — Emergency Department (HOSPITAL_COMMUNITY)
Admission: EM | Admit: 2016-08-27 | Discharge: 2016-08-27 | Disposition: A | Discharge status: Home / Self Care | Payer: Medicare Other | Attending: Emergency Medicine | Admitting: Emergency Medicine

## 2016-08-27 DIAGNOSIS — R269 Unspecified abnormalities of gait and mobility: Secondary | ICD-10-CM

## 2016-08-27 DIAGNOSIS — Y939 Activity, unspecified: Secondary | ICD-10-CM | POA: Insufficient documentation

## 2016-08-27 DIAGNOSIS — Y999 Unspecified external cause status: Secondary | ICD-10-CM | POA: Diagnosis not present

## 2016-08-27 DIAGNOSIS — I2510 Atherosclerotic heart disease of native coronary artery without angina pectoris: Secondary | ICD-10-CM | POA: Diagnosis not present

## 2016-08-27 DIAGNOSIS — Y92009 Unspecified place in unspecified non-institutional (private) residence as the place of occurrence of the external cause: Secondary | ICD-10-CM | POA: Diagnosis not present

## 2016-08-27 DIAGNOSIS — Z7982 Long term (current) use of aspirin: Secondary | ICD-10-CM | POA: Insufficient documentation

## 2016-08-27 DIAGNOSIS — I252 Old myocardial infarction: Secondary | ICD-10-CM | POA: Diagnosis not present

## 2016-08-27 DIAGNOSIS — Z7902 Long term (current) use of antithrombotics/antiplatelets: Secondary | ICD-10-CM | POA: Insufficient documentation

## 2016-08-27 DIAGNOSIS — Z79899 Other long term (current) drug therapy: Secondary | ICD-10-CM | POA: Insufficient documentation

## 2016-08-27 DIAGNOSIS — Z87891 Personal history of nicotine dependence: Secondary | ICD-10-CM | POA: Diagnosis not present

## 2016-08-27 DIAGNOSIS — Z8673 Personal history of transient ischemic attack (TIA), and cerebral infarction without residual deficits: Secondary | ICD-10-CM | POA: Insufficient documentation

## 2016-08-27 DIAGNOSIS — R262 Difficulty in walking, not elsewhere classified: Secondary | ICD-10-CM | POA: Diagnosis not present

## 2016-08-27 DIAGNOSIS — W1830XA Fall on same level, unspecified, initial encounter: Secondary | ICD-10-CM | POA: Diagnosis not present

## 2016-08-27 DIAGNOSIS — R4182 Altered mental status, unspecified: Secondary | ICD-10-CM | POA: Diagnosis present

## 2016-08-27 DIAGNOSIS — Z8546 Personal history of malignant neoplasm of prostate: Secondary | ICD-10-CM | POA: Diagnosis not present

## 2016-08-27 DIAGNOSIS — I10 Essential (primary) hypertension: Secondary | ICD-10-CM | POA: Diagnosis not present

## 2016-08-27 LAB — CBC
HCT: 39.3 % (ref 39.0–52.0)
HEMOGLOBIN: 13.2 g/dL (ref 13.0–17.0)
MCH: 32.8 pg (ref 26.0–34.0)
MCHC: 33.6 g/dL (ref 30.0–36.0)
MCV: 97.8 fL (ref 78.0–100.0)
Platelets: 162 10*3/uL (ref 150–400)
RBC: 4.02 MIL/uL — ABNORMAL LOW (ref 4.22–5.81)
RDW: 13.8 % (ref 11.5–15.5)
WBC: 6.1 10*3/uL (ref 4.0–10.5)

## 2016-08-27 LAB — BASIC METABOLIC PANEL
ANION GAP: 9 (ref 5–15)
BUN: 17 mg/dL (ref 6–20)
CALCIUM: 9.5 mg/dL (ref 8.9–10.3)
CO2: 25 mmol/L (ref 22–32)
Chloride: 105 mmol/L (ref 101–111)
Creatinine, Ser: 1.02 mg/dL (ref 0.61–1.24)
GLUCOSE: 90 mg/dL (ref 65–99)
Potassium: 3.9 mmol/L (ref 3.5–5.1)
SODIUM: 139 mmol/L (ref 135–145)

## 2016-08-27 LAB — CBG MONITORING, ED: GLUCOSE-CAPILLARY: 185 mg/dL — AB (ref 65–99)

## 2016-08-27 LAB — I-STAT TROPONIN, ED
TROPONIN I, POC: 0.02 ng/mL (ref 0.00–0.08)
Troponin i, poc: 0.02 ng/mL (ref 0.00–0.08)

## 2016-08-27 LAB — URINALYSIS, ROUTINE W REFLEX MICROSCOPIC
BILIRUBIN URINE: NEGATIVE
Glucose, UA: NEGATIVE mg/dL
Hgb urine dipstick: NEGATIVE
KETONES UR: NEGATIVE mg/dL
LEUKOCYTES UA: NEGATIVE
NITRITE: NEGATIVE
Protein, ur: NEGATIVE mg/dL
SPECIFIC GRAVITY, URINE: 1.011 (ref 1.005–1.030)
pH: 7 (ref 5.0–8.0)

## 2016-08-27 MED ORDER — GADOBENATE DIMEGLUMINE 529 MG/ML IV SOLN
13.0000 mL | Freq: Once | INTRAVENOUS | Status: AC | PRN
  Administered 2016-08-27: 13 mL via INTRAVENOUS

## 2016-08-27 MED ORDER — LORAZEPAM 2 MG/ML IJ SOLN
0.5000 mg | Freq: Once | INTRAMUSCULAR | Status: AC
  Administered 2016-08-27: 0.5 mg via INTRAVENOUS
  Filled 2016-08-27 (×2): qty 1

## 2016-08-27 MED ORDER — HYDRALAZINE HCL 20 MG/ML IJ SOLN
10.0000 mg | Freq: Once | INTRAMUSCULAR | Status: AC
  Administered 2016-08-27: 10 mg via INTRAVENOUS
  Filled 2016-08-27: qty 1

## 2016-08-27 MED ORDER — ACETAMINOPHEN 325 MG PO TABS
650.0000 mg | ORAL_TABLET | Freq: Once | ORAL | Status: AC
  Administered 2016-08-27: 650 mg via ORAL
  Filled 2016-08-27: qty 2

## 2016-08-27 NOTE — ED Triage Notes (Addendum)
Per EMS - pt from [facility] independent living. Pt fell yesterday, takes plavix. Pt reports intermittent headaches since fall. Unknown LKW. Pt went to nurse today when he wasn't feeling well, RN noticed left-sided facial droop. Skin tear on left arm from previous fall 2 weeks ago. Hx NSTEMI 2015.

## 2016-08-27 NOTE — ED Provider Notes (Signed)
Care assumed from Dr. Ellender Hose at 1700 with plan for f/u MRI.   Results:  BP 141/75   Pulse (!) 56   Temp 97.7 F (36.5 C)   Resp 18   SpO2 97%   Results for orders placed or performed during the hospital encounter of [id number]  Basic metabolic panel  Result Value Ref Range   Sodium 139 135 - 145 mmol/L   Potassium 3.9 3.5 - 5.1 mmol/L   Chloride 105 101 - 111 mmol/L   CO2 25 22 - 32 mmol/L   Glucose, Bld 90 65 - 99 mg/dL   BUN 17 6 - 20 mg/dL   Creatinine, Ser 1.02 0.61 - 1.24 mg/dL   Calcium 9.5 8.9 - 10.3 mg/dL   GFR calc non Af Amer >60 >60 mL/min   GFR calc Af Amer >60 >60 mL/min   Anion gap 9 5 - 15  CBC  Result Value Ref Range   WBC 6.1 4.0 - 10.5 K/uL   RBC 4.02 (L) 4.22 - 5.81 MIL/uL   Hemoglobin 13.2 13.0 - 17.0 g/dL   HCT 39.3 39.0 - 52.0 %   MCV 97.8 78.0 - 100.0 fL   MCH 32.8 26.0 - 34.0 pg   MCHC 33.6 30.0 - 36.0 g/dL   RDW 13.8 11.5 - 15.5 %   Platelets 162 150 - 400 K/uL  Urinalysis, Routine w reflex microscopic  Result Value Ref Range   Color, Urine YELLOW YELLOW   APPearance CLEAR CLEAR   Specific Gravity, Urine 1.011 1.005 - 1.030   pH 7.0 5.0 - 8.0   Glucose, UA NEGATIVE NEGATIVE mg/dL   Hgb urine dipstick NEGATIVE NEGATIVE   Bilirubin Urine NEGATIVE NEGATIVE   Ketones, ur NEGATIVE NEGATIVE mg/dL   Protein, ur NEGATIVE NEGATIVE mg/dL   Nitrite NEGATIVE NEGATIVE   Leukocytes, UA NEGATIVE NEGATIVE  CBG monitoring, ED  Result Value Ref Range   Glucose-Capillary 185 (H) 65 - 99 mg/dL  I-Stat Troponin, ED (not at [facility])  Result Value Ref Range   Troponin i, poc 0.02 0.00 - 0.08 ng/mL   Comment 3          I-Stat Troponin, ED (not at [facility])  Result Value Ref Range   Troponin i, poc 0.02 0.00 - 0.08 ng/mL   Comment 3            Dg Chest 2 View  Result Date: 08/27/2016 CLINICAL DATA:  Status post 3 episodes of falling for the past 10 days. No chest pain or shortness of breath. History of chronic productive cough. Previous MI. Former smoker. EXAM:  CHEST  2 VIEW COMPARISON:  Chest x-ray of June 25, 2016 FINDINGS: The lungs remain hyperinflated. There is chronic flattening of the left hemidiaphragm and blunting of the costophrenic angles on the left. There is no alveolar infiltrate or pleural effusion. There is no pneumothorax or pneumomediastinum. The heart and pulmonary vascularity are normal. There is calcification in the wall of the aortic arch. The bony thorax exhibits no acute abnormality. There is evidence of previous granulomatous infection which is stable. There is mild multilevel degenerative disc disease. There is severe osteoarthritic change of the left shoulder. IMPRESSION: COPD. Chronic scarring at the left lung base. Previous granulomatous infection. No acute pneumonia nor CHF. Thoracic aortic atherosclerosis. Electronically Signed   By: David  Martinique M.D.   On: 08/27/2016 12:19   Ct Head Wo Contrast  Result Date: 08/27/2016 CLINICAL DATA:  Pain following fall.  Left-sided facial droop EXAM: CT  HEAD WITHOUT CONTRAST TECHNIQUE: Contiguous axial images were obtained from the base of the skull through the vertex without intravenous contrast. COMPARISON:  None. FINDINGS: Brain: There is moderate diffuse atrophy. There is no intracranial mass, hemorrhage, extra-axial fluid collection, or midline shift. There is patchy small vessel disease in the centra semiovale bilaterally. No acute infarct evident. Vascular: There is no hyperdense vessel. There is calcification in each carotid siphon. Skull: The bony calvarium appears intact. Sinuses/Orbits: There is mucosal thickening in multiple ethmoid air cells bilaterally. Other paranasal sinuses are clear. Orbits appear symmetric bilaterally. Other: Postoperative change noted in the mastoid region on the left. The mastoid air cells are clear. There is debris in each external auditory canal. IMPRESSION: Atrophy with mild patchy periventricular small vessel disease. No acute infarct evident. No hemorrhage,  mass, or extra-axial fluid collection. There is arteriovascular calcification at multiple sites. There is ethmoid sinus disease bilaterally. There is probable cerumen in each external auditory canal. Electronically Signed   By: Lowella Grip III M.D.   On: 08/27/2016 12:06   Mr Jeri Cos And Wo Contrast  Result Date: 08/27/2016 CLINICAL DATA:  Initial evaluation for recent fall. Intermittent headaches. Left facial droop. The EXAM: MRI HEAD WITHOUT AND WITH CONTRAST TECHNIQUE: Multiplanar, multiecho pulse sequences of the brain and surrounding structures were obtained without and with intravenous contrast. CONTRAST:  72mL MULTIHANCE GADOBENATE DIMEGLUMINE 529 MG/ML IV SOLN COMPARISON:  Prior head CT from earlier the same day. FINDINGS: Brain: Generalized age-related cerebral atrophy present. Patchy and confluent T2/FLAIR hyperintensity within the periventricular white matter most compatible with chronic small vessel ischemic disease, mild in nature. No abnormal foci of restricted diffusion to suggest acute or subacute infarct. Gray-white matter differentiation maintained. No evidence for acute or chronic intracranial hemorrhage. No encephalomalacia to suggest chronic infarction. The no mass lesion, midline shift, or mass effect. Mild ventricular prominence related to global parenchymal volume loss without hydrocephalus. No extra-axial fluid collection. Major dural sinuses are grossly patent. Pituitary gland within normal limits. Vascular: Major intracranial vascular flow voids are maintained. Skull and upper cervical spine: Craniocervical junction within normal limits. Scattered degenerative spondylolysis noted within the upper cervical spine without significant stenosis. Bone marrow signal intensity within normal limits. No scalp soft tissue abnormality. Sinuses/Orbits: Globes and orbital soft tissues demonstrate no acute abnormality. Patient status post cataract extraction bilaterally. Paranasal sinuses are  clear. No mastoid effusion. Inner ear structures normal. IMPRESSION: 1. No acute intracranial process identified. 2. Mild for age chronic microvascular ischemic disease. Electronically Signed   By: Jeannine Boga M.D.   On: 08/27/2016 20:03    Radiology and laboratory examinations were reviewed by me and used in medical decision making if performed.   MDM:  Discussed lack of organic pathology found on today's workup which was extensive including MR brain. Referred for PT balance and gait training eval at friend's home. Plan to follow up with PCP as needed and return precautions discussed for worsening or new concerning symptoms.   Diagnoses that have been ruled out:  None  Diagnoses that are still under consideration:  None  Final diagnoses:  Gait difficulty      Leo Grosser, MD 08/27/16 2117

## 2016-08-27 NOTE — ED Notes (Signed)
Pt transported to CT ?

## 2016-08-27 NOTE — ED Notes (Signed)
Pt transported to MRI 

## 2016-08-27 NOTE — ED Provider Notes (Signed)
[facility] DEPT Provider Note   CSN: [id number] Arrival date & time: 08/27/16  1049     History   Chief Complaint Chief Complaint  Patient presents with  . Facial Droop  . Fall    HPI Craig [name] is a 83 y.o. male.  HPI 83 year old male with extensive past medical history including hypertension, hyperlipidemia, coronary disease, and history of CVA who presents with altered mental status and increasing falls. According to the patient started, the patient has had progressive functional decline over the last several weeks. He endorses intermittent episodes of dizziness as well as gait instability and has fallen 3 times. He fell on Christmas and hit his head but was not evaluated at that time. He is on Plavix as well as aspirin for his heart disease. Since then, patient has had decreased energy, increased confusion, and recurrent falls. Earlier today, he fell out of his bed. He went to the nursing facility and they noticed a new onset of facial droop and sent him here for evaluation. Currently, patient versus a 3 out of 10 generalized aching, throbbing headache. Denies any other complaints.  Past Medical History:  Diagnosis Date  . Anxiety state, unspecified   . CAD (coronary artery disease) 11/20/13  . Carbon monoxide exposure 11/24/13  . Cervicalgia   . Degenerative disc disease   . Degenerative joint disease   . Diverticulitis   . Diverticulosis   . Ejection fraction   . Genital herpes   . Genital herpes 07/10/2013  . GERD (gastroesophageal reflux disease)   . Hip arthritis 09/29/2013   Bilateral  . Hypertension   . Hyponatremia 11/24/13  . Hypoxia 11/24/13  . Insomnia 07/09/2013  . Lumbago   . Lumbosacral spondylolysis   . Lumbosacral spondylosis without myelopathy 09/29/2013  . Malignant neoplasm of prostate ([place])   . NSTEMI (non-ST elevated myocardial infarction) ([place]) 11/20/13  . OA (osteoarthritis) of knee 06/21/2013  . Other and unspecified hyperlipidemia   .  Pneumonia 11/24/13  . Postoperative anemia due to acute blood loss 06/22/2013  . Prostate cancer ([place]) 2012   mets to bone  . TIA (transient ischemic attack)     Patient Active Problem List   Diagnosis Date Noted  . Ejection fraction   . HCAP (healthcare-associated pneumonia) 11/27/2013  . Hypoxia 11/25/2013  . Carbon monoxide exposure 11/25/2013  . Carboxyhemoglobinemia 11/25/2013  . NSTEMI (non-ST elevated myocardial infarction) ([place]) 11/21/2013  . CAP (community acquired pneumonia) 11/21/2013  . Hyperlipidemia 11/21/2013  . Lumbosacral spondylosis without myelopathy 09/29/2013  . Hip arthritis 09/29/2013  . GERD (gastroesophageal reflux disease) 07/13/2013  . Genital herpes 07/10/2013  . Essential hypertension 07/10/2013  . Insomnia 07/09/2013  . TIA (transient ischemic attack) 06/24/2013  . Postoperative anemia due to acute blood loss 06/22/2013  . Hyponatremia 06/22/2013  . OA (osteoarthritis) of knee 06/21/2013  . Prostate cancer ([place]) 04/14/2012    Past Surgical History:  Procedure Laterality Date  . CATARACT EXTRACTION Bilateral   . MASTOIDECTOMY Bilateral   . TONSILLECTOMY    . TOTAL KNEE ARTHROPLASTY Right 06/21/2013   Procedure: RIGHT TOTAL KNEE ARTHROPLASTY;  Surgeon: Gearlean Alf, MD;  Location: [facility];  Service: Orthopedics;  Laterality: Right;. Dr. Wynelle Link       Home Medications    Prior to Admission medications   Medication Sig Start Date End Date Taking? Authorizing Provider  acyclovir (ZOVIRAX) 200 MG capsule Take 200 mg by mouth every morning.  08/27/11  Yes Historical Provider, MD  amLODipine ([place])  5 MG tablet Take 1 tablet (5 mg total) by mouth daily. May take an extra half a tablet prn SBP over 170 Patient taking differently: Take 2.5 mg by mouth 2 (two) times daily. May take an extra half a tablet prn SBP over 170 09/01/15  Yes Burtis Junes, NP  aspirin EC 81 MG tablet Take 40.5 mg by mouth every morning.    Yes Historical Provider, MD    atorvastatin (LIPITOR) 10 MG tablet Take 10 mg by mouth every morning.    Yes Historical Provider, MD  B Complex-C (B-COMPLEX WITH VITAMIN C) tablet Take 1 tablet by mouth every morning.    Yes Historical Provider, MD  bicalutamide (CASODEX) 50 MG tablet Take 50 mg by mouth every morning.  09/16/13  Yes Historical Provider, MD  Calcium Carbonate-Vitamin D (CALCIUM + D PO) Take 2 tablets by mouth every morning.    Yes Historical Provider, MD  cholecalciferol (VITAMIN D) 1000 units tablet Take 1,000 Units by mouth daily.   Yes Historical Provider, MD  clopidogrel (PLAVIX) 75 MG tablet Take 75 mg by mouth every morning.    Yes Historical Provider, MD  denosumab (PROLIA) 60 MG/ML SOLN injection Inject 60 mg into the skin every 6 (six) months. Administer in upper arm, thigh, or abdomen   Yes Historical Provider, MD  denosumab (XGEVA) 120 MG/1.7ML SOLN injection Inject 120 mg into the skin every 30 (thirty) days.    Yes Historical Provider, MD  lisinopril (PRINIVIL,ZESTRIL) 40 MG tablet Take 0.5 tablets (20 mg total) by mouth 2 (two) times daily. 06/19/16  Yes Burtis Junes, NP  LORazepam (ATIVAN) 0.5 MG tablet Take 1 tablet (0.5 mg total) by mouth at bedtime as needed for sleep. 11/29/13  Yes Hosie Poisson, MD  Polyvinyl Alcohol-Povidone (REFRESH OP) Place 1 drop into both eyes daily as needed (dry eyes).   Yes Historical Provider, MD  Probiotic Product (PROBIOTIC ADVANCED PO) Take 1 tablet by mouth every morning.    Yes Historical Provider, MD  RAPAFLO 8 MG CAPS capsule Take 8 mg by mouth every evening. 06/07/16  Yes Historical Provider, MD  sodium chloride (OCEAN) 0.65 % SOLN nasal spray Place 1 spray into both nostrils as needed for congestion.   Yes Historical Provider, MD    Family History Family History  Problem Relation Age of Onset  . Heart disease Mother     Social History Social History  Substance Use Topics  . Smoking status: Former Smoker    Packs/day: 2.00    Years: 15.00    Quit  date: 01/14/1984  . Smokeless tobacco: Never Used  . Alcohol use 1.2 oz/week    2 Shots of liquor per week     Comment: occasional     Allergies   Penicillins   Review of Systems Review of Systems  Constitutional: Positive for fatigue. Negative for chills and fever.  HENT: Negative for congestion and rhinorrhea.   Eyes: Negative for visual disturbance.  Respiratory: Negative for cough, shortness of breath and wheezing.   Cardiovascular: Negative for chest pain and leg swelling.  Gastrointestinal: Negative for abdominal pain, diarrhea, nausea and vomiting.  Genitourinary: Negative for dysuria and flank pain.  Musculoskeletal: Positive for gait problem. Negative for neck pain and neck stiffness.  Skin: Negative for rash and wound.  Allergic/Immunologic: Negative for immunocompromised state.  Neurological: Positive for headaches. Negative for syncope and weakness.  All other systems reviewed and are negative.    Physical Exam Updated Vital Signs BP  115/66   Pulse 66   Temp 97.7 F (36.5 C)   Resp 16   SpO2 96%   Physical Exam  Constitutional: He is oriented to person, place, and time. He appears well-developed and well-nourished. No distress.  HENT:  Head: Normocephalic and atraumatic.  Eyes: Conjunctivae are normal.  Neck: Neck supple.  Cardiovascular: Normal rate, regular rhythm and normal heart sounds.  Exam reveals no friction rub.   No murmur heard. Pulmonary/Chest: Effort normal and breath sounds normal. No respiratory distress. He has no wheezes. He has no rales.  Abdominal: He exhibits no distension.  Musculoskeletal: He exhibits no edema.  Neurological: He is alert and oriented to person, place, and time. He exhibits normal muscle tone.  Skin: Skin is warm. Capillary refill takes less than 2 seconds.  Psychiatric: He has a normal mood and affect.  Nursing note and vitals reviewed.   Neurological Exam:  Mental Status: Alert and oriented to person, place,  and time. Attention and concentration normal. Speech clear. Recent memory is intact. Cranial Nerves: Visual fields intact to confrontation in all quadrants bilaterally. EOMI and PERRLA. No nystagmus noted. Facial sensation intact at forehead, maxillary cheek, and chin/mandible bilaterally. No weakness of masticatory muscles. Subtle flattening of left NLF but no overt facial weakness. Hearing grossly normal to finer rub. Uvula is midline, and palate elevates symmetrically. Normal SCM and trapezius strength. Tongue midline without fasciculations Motor: Muscle strength 5/5 in proximal and distal UE and LE bilaterally. No pronator drift. Muscle tone normal. Reflexes: 2+ and symmetrical in all four extremities.  Sensation: Intact to light touch in upper and lower extremities distally bilaterally.  Gait: Normal without ataxia. Coordination: Normal FTN bilaterally.     ED Treatments / Results  Labs (all labs ordered are listed, but only abnormal results are displayed) Labs Reviewed  CBC - Abnormal; Notable for the following:       Result Value   RBC 4.02 (*)    All other components within normal limits  CBG MONITORING, ED - Abnormal; Notable for the following:    Glucose-Capillary 185 (*)    All other components within normal limits  BASIC METABOLIC PANEL  URINALYSIS, ROUTINE W REFLEX MICROSCOPIC  I-STAT TROPOININ, ED  I-STAT TROPOININ, ED    EKG Normal sinus rhythm, VR 68. Normal intervals. No acute ST segment changes.  Radiology Dg Chest 2 View  Result Date: 08/27/2016 CLINICAL DATA:  Status post 3 episodes of falling for the past 10 days. No chest pain or shortness of breath. History of chronic productive cough. Previous MI. Former smoker. EXAM: CHEST  2 VIEW COMPARISON:  Chest x-ray of June 25, 2016 FINDINGS: The lungs remain hyperinflated. There is chronic flattening of the left hemidiaphragm and blunting of the costophrenic angles on the left. There is no alveolar infiltrate or  pleural effusion. There is no pneumothorax or pneumomediastinum. The heart and pulmonary vascularity are normal. There is calcification in the wall of the aortic arch. The bony thorax exhibits no acute abnormality. There is evidence of previous granulomatous infection which is stable. There is mild multilevel degenerative disc disease. There is severe osteoarthritic change of the left shoulder. IMPRESSION: COPD. Chronic scarring at the left lung base. Previous granulomatous infection. No acute pneumonia nor CHF. Thoracic aortic atherosclerosis. Electronically Signed   By: David  Martinique M.D.   On: 08/27/2016 12:19   Ct Head Wo Contrast  Result Date: 08/27/2016 CLINICAL DATA:  Pain following fall.  Left-sided facial droop EXAM: CT HEAD WITHOUT  CONTRAST TECHNIQUE: Contiguous axial images were obtained from the base of the skull through the vertex without intravenous contrast. COMPARISON:  None. FINDINGS: Brain: There is moderate diffuse atrophy. There is no intracranial mass, hemorrhage, extra-axial fluid collection, or midline shift. There is patchy small vessel disease in the centra semiovale bilaterally. No acute infarct evident. Vascular: There is no hyperdense vessel. There is calcification in each carotid siphon. Skull: The bony calvarium appears intact. Sinuses/Orbits: There is mucosal thickening in multiple ethmoid air cells bilaterally. Other paranasal sinuses are clear. Orbits appear symmetric bilaterally. Other: Postoperative change noted in the mastoid region on the left. The mastoid air cells are clear. There is debris in each external auditory canal. IMPRESSION: Atrophy with mild patchy periventricular small vessel disease. No acute infarct evident. No hemorrhage, mass, or extra-axial fluid collection. There is arteriovascular calcification at multiple sites. There is ethmoid sinus disease bilaterally. There is probable cerumen in each external auditory canal. Electronically Signed   By: Lowella Grip III M.D.   On: 08/27/2016 12:06   Mr Jeri Cos And Wo Contrast  Result Date: 08/27/2016 CLINICAL DATA:  Initial evaluation for recent fall. Intermittent headaches. Left facial droop. The EXAM: MRI HEAD WITHOUT AND WITH CONTRAST TECHNIQUE: Multiplanar, multiecho pulse sequences of the brain and surrounding structures were obtained without and with intravenous contrast. CONTRAST:  78mL MULTIHANCE GADOBENATE DIMEGLUMINE 529 MG/ML IV SOLN COMPARISON:  Prior head CT from earlier the same day. FINDINGS: Brain: Generalized age-related cerebral atrophy present. Patchy and confluent T2/FLAIR hyperintensity within the periventricular white matter most compatible with chronic small vessel ischemic disease, mild in nature. No abnormal foci of restricted diffusion to suggest acute or subacute infarct. Gray-white matter differentiation maintained. No evidence for acute or chronic intracranial hemorrhage. No encephalomalacia to suggest chronic infarction. The no mass lesion, midline shift, or mass effect. Mild ventricular prominence related to global parenchymal volume loss without hydrocephalus. No extra-axial fluid collection. Major dural sinuses are grossly patent. Pituitary gland within normal limits. Vascular: Major intracranial vascular flow voids are maintained. Skull and upper cervical spine: Craniocervical junction within normal limits. Scattered degenerative spondylolysis noted within the upper cervical spine without significant stenosis. Bone marrow signal intensity within normal limits. No scalp soft tissue abnormality. Sinuses/Orbits: Globes and orbital soft tissues demonstrate no acute abnormality. Patient status post cataract extraction bilaterally. Paranasal sinuses are clear. No mastoid effusion. Inner ear structures normal. IMPRESSION: 1. No acute intracranial process identified. 2. Mild for age chronic microvascular ischemic disease. Electronically Signed   By: Jeannine Boga M.D.   On:  08/27/2016 20:03    Procedures Procedures (including critical care time)  Medications Ordered in ED Medications  hydrALAZINE (APRESOLINE) injection 10 mg (10 mg Intravenous Given 08/27/16 1215)  acetaminophen (TYLENOL) tablet 650 mg (650 mg Oral Given 08/27/16 1335)  LORazepam (ATIVAN) injection 0.5 mg (0.5 mg Intravenous Given 08/27/16 1833)  gadobenate dimeglumine (MULTIHANCE) injection 13 mL (13 mLs Intravenous Contrast Given 08/27/16 1936)     Initial Impression / Assessment and Plan / ED Course  I have reviewed the triage vital signs and the nursing notes.  Pertinent labs & imaging results that were available during my care of the patient were reviewed by me and considered in my medical decision making (see chart for details).  Clinical Course     83 yo M with PMHx as above here with mild left facial droop, increasing frequency of falls at home. Currently in ALF, family may need SNF. On arrival, VSS and WNL. Exam  is as above. Pt well appearing and in NAD. Left facial droop is subtle, no other neuro deficits noted on exam and gait is WNL. Will obtain CT, broad labs, and re-assess.  CT head neg. Labs unremarkable. CBC with no leukocytosis. UA and CXR negative with no signs of occult infection. Delta trop neg. Plan for MR BRAIN WWO contrast with discussion with neuro pending results and based on repeat exam.  Final Clinical Impressions(s) / ED Diagnoses   Final diagnoses:  Gait difficulty    New Prescriptions Discharge Medication List as of 08/27/2016  9:17 PM       Duffy Bruce, MD 08/28/16 [phone]

## 2016-08-27 NOTE — ED Notes (Signed)
Contacted MRI re: delay in scan. Unknown estimated time for when scan will be done d/t MRI scanner being broken down earlier this afternoon.

## 2016-08-27 NOTE — ED Notes (Signed)
Pt returned to room from imaging department.  

## 2016-08-27 NOTE — ED Notes (Signed)
Wheeled patient to the bathroom patient is back in bed resting

## 2016-08-27 NOTE — ED Notes (Signed)
Pt had leg bag placed prior to discharge

## 2016-09-04 ENCOUNTER — Telehealth: Payer: Self-pay | Admitting: Oncology

## 2016-09-04 NOTE — Telephone Encounter (Signed)
Patient's daughter called to have missed appointment rescheduled.

## 2016-09-09 NOTE — Progress Notes (Deleted)
CARDIOLOGY OFFICE NOTE  Date:  09/09/2016    Craig [name] Date of Birth: February 28, 1926 Medical Record [id number]  PCP:  Myriam Jacobson, MD  Cardiologist:  Servando Snare & ***    No chief complaint on file.   History of Present Illness: Craig [name] is a 83 y.o. male who presents today for a ***   Comes in today. Here with   Past Medical History:  Diagnosis Date  . Anxiety state, unspecified   . CAD (coronary artery disease) 11/20/13  . Carbon monoxide exposure 11/24/13  . Cervicalgia   . Degenerative disc disease   . Degenerative joint disease   . Diverticulitis   . Diverticulosis   . Ejection fraction   . Genital herpes   . Genital herpes 07/10/2013  . GERD (gastroesophageal reflux disease)   . Hip arthritis 09/29/2013   Bilateral  . Hypertension   . Hyponatremia 11/24/13  . Hypoxia 11/24/13  . Insomnia 07/09/2013  . Lumbago   . Lumbosacral spondylolysis   . Lumbosacral spondylosis without myelopathy 09/29/2013  . Malignant neoplasm of prostate ([place])   . NSTEMI (non-ST elevated myocardial infarction) ([place]) 11/20/13  . OA (osteoarthritis) of knee 06/21/2013  . Other and unspecified hyperlipidemia   . Pneumonia 11/24/13  . Postoperative anemia due to acute blood loss 06/22/2013  . Prostate cancer ([place]) 2012   mets to bone  . TIA (transient ischemic attack)     Past Surgical History:  Procedure Laterality Date  . CATARACT EXTRACTION Bilateral   . MASTOIDECTOMY Bilateral   . TONSILLECTOMY    . TOTAL KNEE ARTHROPLASTY Right 06/21/2013   Procedure: RIGHT TOTAL KNEE ARTHROPLASTY;  Surgeon: Gearlean Alf, MD;  Location: [facility];  Service: Orthopedics;  Laterality: Right;. Dr. Wynelle Link     Medications: Current Outpatient Prescriptions  Medication Sig Dispense Refill  . acyclovir (ZOVIRAX) 200 MG capsule Take 200 mg by mouth every morning.     Marland Kitchen amLODipine (NORVASC) 5 MG tablet Take 1 tablet (5 mg total) by mouth daily. May take an extra half a tablet prn  SBP over 170 (Patient taking differently: Take 2.5 mg by mouth 2 (two) times daily. May take an extra half a tablet prn SBP over 170) 45 tablet 6  . aspirin EC 81 MG tablet Take 40.5 mg by mouth every morning.     Marland Kitchen atorvastatin (LIPITOR) 10 MG tablet Take 10 mg by mouth every morning.     . B Complex-C (B-COMPLEX WITH VITAMIN C) tablet Take 1 tablet by mouth every morning.     . bicalutamide (CASODEX) 50 MG tablet Take 50 mg by mouth every morning.     . Calcium Carbonate-Vitamin D (CALCIUM + D PO) Take 2 tablets by mouth every morning.     . cholecalciferol (VITAMIN D) 1000 units tablet Take 1,000 Units by mouth daily.    . clopidogrel (PLAVIX) 75 MG tablet Take 75 mg by mouth every morning.     . denosumab (PROLIA) 60 MG/ML SOLN injection Inject 60 mg into the skin every 6 (six) months. Administer in upper arm, thigh, or abdomen    . denosumab (XGEVA) 120 MG/1.7ML SOLN injection Inject 120 mg into the skin every 30 (thirty) days.     Marland Kitchen lisinopril (PRINIVIL,ZESTRIL) 40 MG tablet Take 0.5 tablets (20 mg total) by mouth 2 (two) times daily. 90 tablet 3  . LORazepam (ATIVAN) 0.5 MG tablet Take 1 tablet (0.5 mg total) by mouth at bedtime as needed  for sleep. 10 tablet 0  . Polyvinyl Alcohol-Povidone (REFRESH OP) Place 1 drop into both eyes daily as needed (dry eyes).    . Probiotic Product (PROBIOTIC ADVANCED PO) Take 1 tablet by mouth every morning.     Marland Kitchen RAPAFLO 8 MG CAPS capsule Take 8 mg by mouth every evening.    . sodium chloride (OCEAN) 0.65 % SOLN nasal spray Place 1 spray into both nostrils as needed for congestion.     No current facility-administered medications for this visit.     Allergies: Allergies  Allergen Reactions  . Penicillins Other (See Comments)    Patient doesn't remember what kind of reaction  Has patient had a PCN reaction causing immediate rash, facial/tongue/throat swelling, SOB or lightheadedness with hypotension: n/a Has patient had a PCN reaction causing severe  rash involving mucus membranes or skin necrosis:  N/a Has patient had a PCN reaction that required hospitalization: n/a Has patient had a PCN reaction occurring within the last 10 years: n/a If all of the above answers are "NO", then may proceed with Cephalosporin use.     Social History: The patient  reports that he quit smoking about 32 years ago. He has a 30.00 pack-year smoking history. He has never used smokeless tobacco. He reports that he drinks about 1.2 oz of alcohol per week .   Family History: The patient's ***family history includes Heart disease in his mother.   Review of Systems: Please see the history of present illness.   Otherwise, the review of systems is positive for {NONE DEFAULTED:18576::"none"}.   All other systems are reviewed and negative.   Physical Exam: VS:  There were no vitals taken for this visit. Marland Kitchen  BMI There is no height or weight on file to calculate BMI.  Wt Readings from Last 3 Encounters:  06/04/16 144 lb 6.4 oz (65.5 kg)  05/10/16 145 lb (65.8 kg)  04/16/16 148 lb 4.8 oz (67.3 kg)    General: Pleasant. Well developed, well nourished and in no acute distress.   HEENT: Normal.  Neck: Supple, no JVD, carotid bruits, or masses noted.  Cardiac: ***Regular rate and rhythm. No murmurs, rubs, or gallops. No edema.  Respiratory:  Lungs are clear to auscultation bilaterally with normal work of breathing.  GI: Soft and nontender.  MS: No deformity or atrophy. Gait and ROM intact.  Skin: Warm and dry. Color is normal.  Neuro:  Strength and sensation are intact and no gross focal deficits noted.  Psych: Alert, appropriate and with normal affect.   LABORATORY DATA:  EKG:  EKG {ACTION; IS/IS [id number] ordered today. This demonstrates ***.  Lab Results  Component Value Date   WBC 6.1 08/27/2016   HGB 13.2 08/27/2016   HCT 39.3 08/27/2016   PLT 162 08/27/2016   GLUCOSE 90 08/27/2016   CHOL 132 11/22/2013   TRIG 55 11/22/2013   HDL 77  11/22/2013   LDLCALC 44 11/22/2013   ALT 12 09/01/2015   AST 16 09/01/2015   NA 139 08/27/2016   K 3.9 08/27/2016   CL 105 08/27/2016   CREATININE 1.02 08/27/2016   BUN 17 08/27/2016   CO2 25 08/27/2016   TSH 1.45 06/04/2016   PSA 0.23 03/16/2013   INR 1.20 11/23/2013   HGBA1C 5.5 06/24/2013    BNP (last 3 results) No results for input(s): BNP in the last 8760 hours.  ProBNP (last 3 results) No results for input(s): PROBNP in the last 8760 hours.   Other Studies Reviewed  Today:   Assessment/Plan:   Current medicines are reviewed with the patient today.  The patient does not have concerns regarding medicines other than what has been noted above.  The following changes have been made:  See above.  Labs/ tests ordered today include:   No orders of the defined types were placed in this encounter.    Disposition:   FU with *** in {gen number [id number] {Days to years:10300}.   Patient is agreeable to this plan and will call if any problems develop in the interim.   Signed: Burtis Junes, RN, ANP-C 09/09/2016 [facility] [address] [place], [place]  [address] Phone: [phone] Fax: [phone]

## 2016-09-10 ENCOUNTER — Inpatient Hospital Stay (HOSPITAL_COMMUNITY)
Admission: EM | Admit: 2016-09-10 | Discharge: 2016-09-14 | DRG: 871 | Disposition: A | Discharge status: Skilled Nursing Facility | Payer: Medicare Other | Attending: Internal Medicine | Admitting: Internal Medicine

## 2016-09-10 ENCOUNTER — Ambulatory Visit: Payer: Medicare Other | Admitting: Nurse Practitioner

## 2016-09-10 ENCOUNTER — Emergency Department (HOSPITAL_COMMUNITY): Payer: Medicare Other

## 2016-09-10 ENCOUNTER — Encounter (HOSPITAL_COMMUNITY): Payer: Self-pay | Admitting: *Deleted

## 2016-09-10 DIAGNOSIS — I471 Supraventricular tachycardia, unspecified: Secondary | ICD-10-CM

## 2016-09-10 DIAGNOSIS — Z96651 Presence of right artificial knee joint: Secondary | ICD-10-CM | POA: Diagnosis present

## 2016-09-10 DIAGNOSIS — N390 Urinary tract infection, site not specified: Secondary | ICD-10-CM | POA: Diagnosis not present

## 2016-09-10 DIAGNOSIS — I10 Essential (primary) hypertension: Secondary | ICD-10-CM | POA: Diagnosis not present

## 2016-09-10 DIAGNOSIS — Z7982 Long term (current) use of aspirin: Secondary | ICD-10-CM

## 2016-09-10 DIAGNOSIS — Z7902 Long term (current) use of antithrombotics/antiplatelets: Secondary | ICD-10-CM

## 2016-09-10 DIAGNOSIS — I2510 Atherosclerotic heart disease of native coronary artery without angina pectoris: Secondary | ICD-10-CM | POA: Diagnosis present

## 2016-09-10 DIAGNOSIS — R4189 Other symptoms and signs involving cognitive functions and awareness: Secondary | ICD-10-CM

## 2016-09-10 DIAGNOSIS — R251 Tremor, unspecified: Secondary | ICD-10-CM

## 2016-09-10 DIAGNOSIS — Z9842 Cataract extraction status, left eye: Secondary | ICD-10-CM

## 2016-09-10 DIAGNOSIS — N179 Acute kidney failure, unspecified: Secondary | ICD-10-CM | POA: Diagnosis present

## 2016-09-10 DIAGNOSIS — Z87891 Personal history of nicotine dependence: Secondary | ICD-10-CM

## 2016-09-10 DIAGNOSIS — A419 Sepsis, unspecified organism: Principal | ICD-10-CM

## 2016-09-10 DIAGNOSIS — Z880 Allergy status to penicillin: Secondary | ICD-10-CM

## 2016-09-10 DIAGNOSIS — Z8546 Personal history of malignant neoplasm of prostate: Secondary | ICD-10-CM

## 2016-09-10 DIAGNOSIS — M545 Low back pain: Secondary | ICD-10-CM | POA: Diagnosis present

## 2016-09-10 DIAGNOSIS — R404 Transient alteration of awareness: Secondary | ICD-10-CM

## 2016-09-10 DIAGNOSIS — G4700 Insomnia, unspecified: Secondary | ICD-10-CM | POA: Diagnosis present

## 2016-09-10 DIAGNOSIS — K219 Gastro-esophageal reflux disease without esophagitis: Secondary | ICD-10-CM | POA: Diagnosis present

## 2016-09-10 DIAGNOSIS — F411 Generalized anxiety disorder: Secondary | ICD-10-CM | POA: Diagnosis present

## 2016-09-10 DIAGNOSIS — G8929 Other chronic pain: Secondary | ICD-10-CM | POA: Diagnosis present

## 2016-09-10 DIAGNOSIS — Z9841 Cataract extraction status, right eye: Secondary | ICD-10-CM

## 2016-09-10 DIAGNOSIS — R55 Syncope and collapse: Secondary | ICD-10-CM | POA: Diagnosis present

## 2016-09-10 DIAGNOSIS — G9340 Encephalopathy, unspecified: Secondary | ICD-10-CM | POA: Diagnosis present

## 2016-09-10 DIAGNOSIS — Z8673 Personal history of transient ischemic attack (TIA), and cerebral infarction without residual deficits: Secondary | ICD-10-CM

## 2016-09-10 DIAGNOSIS — Z66 Do not resuscitate: Secondary | ICD-10-CM | POA: Diagnosis present

## 2016-09-10 DIAGNOSIS — C7951 Secondary malignant neoplasm of bone: Secondary | ICD-10-CM | POA: Diagnosis present

## 2016-09-10 DIAGNOSIS — Z79899 Other long term (current) drug therapy: Secondary | ICD-10-CM

## 2016-09-10 LAB — CBC WITH DIFFERENTIAL/PLATELET
BASOS ABS: 0 10*3/uL (ref 0.0–0.1)
BASOS ABS: 0 10*3/uL (ref 0.0–0.1)
Basophils Relative: 0 %
Basophils Relative: 0 %
EOS ABS: 0 10*3/uL (ref 0.0–0.7)
EOS PCT: 0 %
Eosinophils Absolute: 0 10*3/uL (ref 0.0–0.7)
Eosinophils Relative: 0 %
HCT: 39.5 % (ref 39.0–52.0)
HCT: 42.9 % (ref 39.0–52.0)
Hemoglobin: 13.3 g/dL (ref 13.0–17.0)
Hemoglobin: 14.6 g/dL (ref 13.0–17.0)
LYMPHS ABS: 2.1 10*3/uL (ref 0.7–4.0)
LYMPHS PCT: 17 %
Lymphocytes Relative: 17 %
Lymphs Abs: 2.3 10*3/uL (ref 0.7–4.0)
MCH: 32.8 pg (ref 26.0–34.0)
MCH: 33.1 pg (ref 26.0–34.0)
MCHC: 33.7 g/dL (ref 30.0–36.0)
MCHC: 34 g/dL (ref 30.0–36.0)
MCV: 97.5 fL (ref 78.0–100.0)
MCV: 97.3 fL (ref 78.0–100.0)
MONOS PCT: 13 %
Monocytes Absolute: 1.5 10*3/uL — ABNORMAL HIGH (ref 0.1–1.0)
Monocytes Absolute: 1.7 10*3/uL — ABNORMAL HIGH (ref 0.1–1.0)
Monocytes Relative: 12 %
NEUTROS ABS: 8.6 10*3/uL — AB (ref 1.7–7.7)
NEUTROS PCT: 70 %
Neutro Abs: 9.4 10*3/uL — ABNORMAL HIGH (ref 1.7–7.7)
Neutrophils Relative %: 71 %
PLATELETS: 171 10*3/uL (ref 150–400)
Platelets: 178 10*3/uL (ref 150–400)
RBC: 4.05 MIL/uL — ABNORMAL LOW (ref 4.22–5.81)
RBC: 4.41 MIL/uL (ref 4.22–5.81)
RDW: 13.7 % (ref 11.5–15.5)
RDW: 13.6 % (ref 11.5–15.5)
WBC: 12.2 10*3/uL — ABNORMAL HIGH (ref 4.0–10.5)
WBC: 13.4 10*3/uL — AB (ref 4.0–10.5)

## 2016-09-10 LAB — I-STAT TROPONIN, ED
TROPONIN I, POC: 0.01 ng/mL (ref 0.00–0.08)
Troponin i, poc: 0.01 ng/mL (ref 0.00–0.08)

## 2016-09-10 LAB — URINALYSIS, ROUTINE W REFLEX MICROSCOPIC
Bilirubin Urine: NEGATIVE
GLUCOSE, UA: NEGATIVE mg/dL
HGB URINE DIPSTICK: NEGATIVE
KETONES UR: NEGATIVE mg/dL
Nitrite: NEGATIVE
Protein, ur: 30 mg/dL — AB
SQUAMOUS EPITHELIAL / LPF: NONE SEEN
Specific Gravity, Urine: 1.018 (ref 1.005–1.030)
pH: 5 (ref 5.0–8.0)

## 2016-09-10 LAB — COMPREHENSIVE METABOLIC PANEL
ALBUMIN: 3.6 g/dL (ref 3.5–5.0)
ALBUMIN: 4.1 g/dL (ref 3.5–5.0)
ALK PHOS: 69 U/L (ref 38–126)
ALT: 14 U/L — ABNORMAL LOW (ref 17–63)
ALT: 16 U/L — AB (ref 17–63)
AST: 22 U/L (ref 15–41)
AST: 24 U/L (ref 15–41)
Alkaline Phosphatase: 59 U/L (ref 38–126)
Anion gap: 9 (ref 5–15)
Anion gap: 11 (ref 5–15)
BUN: 25 mg/dL — ABNORMAL HIGH (ref 6–20)
BUN: 26 mg/dL — AB (ref 6–20)
CO2: 26 mmol/L (ref 22–32)
CO2: 28 mmol/L (ref 22–32)
Calcium: 8.9 mg/dL (ref 8.9–10.3)
Calcium: 9.3 mg/dL (ref 8.9–10.3)
Chloride: 102 mmol/L (ref 101–111)
Chloride: 98 mmol/L — ABNORMAL LOW (ref 101–111)
Creatinine, Ser: 1.34 mg/dL — ABNORMAL HIGH (ref 0.61–1.24)
Creatinine, Ser: 1.2 mg/dL (ref 0.61–1.24)
GFR calc Af Amer: 53 mL/min — ABNORMAL LOW (ref >60)
GFR calc Af Amer: >60 mL/min (ref >60)
GFR calc non Af Amer: 46 mL/min — ABNORMAL LOW (ref >60)
GFR calc non Af Amer: 52 mL/min — ABNORMAL LOW (ref >60)
GLUCOSE: 92 mg/dL (ref 65–99)
GLUCOSE: 119 mg/dL — AB (ref 65–99)
POTASSIUM: 4 mmol/L (ref 3.5–5.1)
Potassium: 4.2 mmol/L (ref 3.5–5.1)
SODIUM: 137 mmol/L (ref 135–145)
SODIUM: 137 mmol/L (ref 135–145)
Total Bilirubin: 0.8 mg/dL (ref 0.3–1.2)
Total Bilirubin: 0.7 mg/dL (ref 0.3–1.2)
Total Protein: 6.1 g/dL — ABNORMAL LOW (ref 6.5–8.1)
Total Protein: 7.2 g/dL (ref 6.5–8.1)

## 2016-09-10 LAB — TROPONIN I: Troponin I: 0.03 ng/mL (ref <0.03)

## 2016-09-10 LAB — GLUCOSE, CAPILLARY: GLUCOSE-CAPILLARY: 89 mg/dL (ref 65–99)

## 2016-09-10 MED ORDER — TAMSULOSIN HCL 0.4 MG PO CAPS
0.4000 mg | ORAL_CAPSULE | Freq: Every day | ORAL | Status: DC
  Administered 2016-09-10 – 2016-09-13 (×4): 0.4 mg via ORAL
  Filled 2016-09-10 (×5): qty 1

## 2016-09-10 MED ORDER — ENOXAPARIN SODIUM 40 MG/0.4ML ~~LOC~~ SOLN
40.0000 mg | SUBCUTANEOUS | Status: DC
  Administered 2016-09-11 – 2016-09-14 (×4): 40 mg via SUBCUTANEOUS
  Filled 2016-09-10 (×4): qty 0.4

## 2016-09-10 MED ORDER — CEPHALEXIN 500 MG PO CAPS
500.0000 mg | ORAL_CAPSULE | Freq: Two times a day (BID) | ORAL | Status: DC
  Administered 2016-09-10 – 2016-09-11 (×2): 500 mg via ORAL
  Filled 2016-09-10 (×2): qty 1

## 2016-09-10 MED ORDER — ACETAMINOPHEN 325 MG PO TABS
650.0000 mg | ORAL_TABLET | Freq: Four times a day (QID) | ORAL | Status: DC | PRN
Start: 2016-09-10 — End: 2016-09-14
  Administered 2016-09-10 – 2016-09-11 (×3): 650 mg via ORAL
  Filled 2016-09-10 (×3): qty 2

## 2016-09-10 MED ORDER — CLOPIDOGREL BISULFATE 75 MG PO TABS
75.0000 mg | ORAL_TABLET | Freq: Every day | ORAL | Status: DC
  Administered 2016-09-11 – 2016-09-14 (×4): 75 mg via ORAL
  Filled 2016-09-10 (×4): qty 1

## 2016-09-10 MED ORDER — ONDANSETRON HCL 4 MG PO TABS: 4.0000 mg | ORAL_TABLET | Freq: Four times a day (QID) | ORAL | Status: DC | PRN

## 2016-09-10 MED ORDER — B COMPLEX-C PO TABS
1.0000 | ORAL_TABLET | Freq: Every day | ORAL | Status: DC
  Administered 2016-09-11 – 2016-09-14 (×4): 1 via ORAL
  Filled 2016-09-10 (×4): qty 1

## 2016-09-10 MED ORDER — SODIUM CHLORIDE 0.9% FLUSH: 3.0000 mL | INTRAVENOUS | Status: DC | PRN

## 2016-09-10 MED ORDER — ACETAMINOPHEN 650 MG RE SUPP: 650.0000 mg | Freq: Four times a day (QID) | RECTAL | Status: DC | PRN

## 2016-09-10 MED ORDER — SODIUM CHLORIDE 0.9 % IV SOLN: 250.0000 mL | INTRAVENOUS | Status: DC | PRN

## 2016-09-10 MED ORDER — DENOSUMAB 60 MG/ML ~~LOC~~ SOLN: 60.0000 mg | SUBCUTANEOUS | Status: DC

## 2016-09-10 MED ORDER — ONDANSETRON HCL 4 MG/2ML IJ SOLN: 4.0000 mg | Freq: Four times a day (QID) | INTRAMUSCULAR | Status: DC | PRN

## 2016-09-10 MED ORDER — LORAZEPAM 1 MG PO TABS
0.5000 mg | ORAL_TABLET | Freq: Every evening | ORAL | Status: DC | PRN
  Administered 2016-09-11 – 2016-09-14 (×2): 0.5 mg via ORAL
  Filled 2016-09-10 (×2): qty 1

## 2016-09-10 MED ORDER — BICALUTAMIDE 50 MG PO TABS
50.0000 mg | ORAL_TABLET | Freq: Every day | ORAL | Status: DC
  Administered 2016-09-11 – 2016-09-14 (×4): 50 mg via ORAL
  Filled 2016-09-10 (×4): qty 1

## 2016-09-10 MED ORDER — SODIUM CHLORIDE 0.9 % IV SOLN
INTRAVENOUS | Status: DC
  Administered 2016-09-10 – 2016-09-14 (×3): via INTRAVENOUS

## 2016-09-10 MED ORDER — DENOSUMAB 120 MG/1.7ML ~~LOC~~ SOLN: 120.0000 mg | SUBCUTANEOUS | Status: DC

## 2016-09-10 MED ORDER — SODIUM CHLORIDE 0.9% FLUSH
3.0000 mL | Freq: Two times a day (BID) | INTRAVENOUS | Status: DC
  Administered 2016-09-11 – 2016-09-13 (×3): 3 mL via INTRAVENOUS

## 2016-09-10 MED ORDER — ATORVASTATIN CALCIUM 10 MG PO TABS
10.0000 mg | ORAL_TABLET | Freq: Every day | ORAL | Status: DC
  Administered 2016-09-11 – 2016-09-14 (×4): 10 mg via ORAL
  Filled 2016-09-10 (×4): qty 1

## 2016-09-10 NOTE — H&P (Addendum)
History and Physical    Craig [name] [id number] DOB: 10/10/1925 DOA: 09/10/2016    PCP: Myriam Jacobson, MD  Patient coming from: SNF  Chief Complaint: difficult to arouse   HPI: Craig [name] is a 83 y.o. male with medical history significant of chronic back pain with degenerative disc disease and compression fractures, TIA who presents for an episode today where he was difficult to arouse. According to the son and daughter, the patient was sleeping but when his aide tried to wake him up, he would not awaken. He began to gurgle and subsequently vomited while unconscious. His aide began to sit him up and shake him and eventually he was able to regain consciousness. He states that during the time where he was not responding, he was able to hear some things that were happening but could not talk or move. Family goes on to explain that he has had 2 episodes where he had sudden shaking followed by severe weakness. During these episodes the patient was not able to speak. After one such episode, on 1/16, he presented to the ER and underwent a CT scan and MRI of the brain which were negative. He is currently receiving rehabilitation at a skilled nursing facility. His son and daughter have noted that he has developed a tremor over the past 3 weeks which is mostly present in his extremities when he moves them. His daughter also felt that he may have a UTI and his urine was sent to his PCP for UA and culture-results have not yet been received.  ED Course: Uneventful ED course-CT of the head without contrast unrevealing-blood work reveals mild AKI and a WBC count of 12.  Review of Systems:  All other systems reviewed and apart from HPI, are negative.  Past Medical History:  Diagnosis Date  . Anxiety state, unspecified   . CAD (coronary artery disease) 11/20/13  . Carbon monoxide exposure 11/24/13  . Cervicalgia   . Degenerative disc disease   . Degenerative joint disease   .  Diverticulitis   . Diverticulosis   . Ejection fraction   . Genital herpes   . Genital herpes 07/10/2013  . GERD (gastroesophageal reflux disease)   . Hip arthritis 09/29/2013   Bilateral  . Hypertension   . Hyponatremia 11/24/13  . Hypoxia 11/24/13  . Insomnia 07/09/2013  . Lumbago   . Lumbosacral spondylolysis   . Lumbosacral spondylosis without myelopathy 09/29/2013  . Malignant neoplasm of prostate ([place])   . NSTEMI (non-ST elevated myocardial infarction) ([place]) 11/20/13  . OA (osteoarthritis) of knee 06/21/2013  . Other and unspecified hyperlipidemia   . Pneumonia 11/24/13  . Postoperative anemia due to acute blood loss 06/22/2013  . Prostate cancer ([place]) 2012   mets to bone  . TIA (transient ischemic attack)     Past Surgical History:  Procedure Laterality Date  . CATARACT EXTRACTION Bilateral   . MASTOIDECTOMY Bilateral   . TONSILLECTOMY    . TOTAL KNEE ARTHROPLASTY Right 06/21/2013   Procedure: RIGHT TOTAL KNEE ARTHROPLASTY;  Surgeon: Gearlean Alf, MD;  Location: [facility];  Service: Orthopedics;  Laterality: Right;. Dr. Wynelle Link    Social History:   reports that he quit smoking about 32 years ago. He has a 30.00 pack-year smoking history. He has never used smokeless tobacco. He reports that he drinks about 1.2 oz of alcohol per week . His drug history is not on file.  Allergies  Allergen Reactions  . Penicillins Other (See Comments)  Patient doesn't remember what kind of reaction  Has patient had a PCN reaction causing immediate rash, facial/tongue/throat swelling, SOB or lightheadedness with hypotension: n/a Has patient had a PCN reaction causing severe rash involving mucus membranes or skin necrosis:  N/a Has patient had a PCN reaction that required hospitalization: n/a Has patient had a PCN reaction occurring within the last 10 years: n/a If all of the above answers are "NO", then may proceed with Cephalosporin use.     Family History  Problem Relation Age of Onset   . Heart disease Mother      Prior to Admission medications   Medication Sig Start Date End Date Taking? Authorizing Provider  acyclovir (ZOVIRAX) 200 MG capsule Take 200 mg by mouth every morning.  08/27/11   Historical Provider, MD  amLODipine (NORVASC) 5 MG tablet Take 1 tablet (5 mg total) by mouth daily. May take an extra half a tablet prn SBP over 170 Patient taking differently: Take 2.5 mg by mouth 2 (two) times daily. May take an extra half a tablet prn SBP over 170 09/01/15   Burtis Junes, NP  aspirin EC 81 MG tablet Take 40.5 mg by mouth every morning.     Historical Provider, MD  atorvastatin (LIPITOR) 10 MG tablet Take 10 mg by mouth every morning.     Historical Provider, MD  B Complex-C (B-COMPLEX WITH VITAMIN C) tablet Take 1 tablet by mouth every morning.     Historical Provider, MD  bicalutamide (CASODEX) 50 MG tablet Take 50 mg by mouth every morning.  09/16/13   Historical Provider, MD  Calcium Carbonate-Vitamin D (CALCIUM + D PO) Take 2 tablets by mouth every morning.     Historical Provider, MD  cholecalciferol (VITAMIN D) 1000 units tablet Take 1,000 Units by mouth daily.    Historical Provider, MD  clopidogrel (PLAVIX) 75 MG tablet Take 75 mg by mouth every morning.     Historical Provider, MD  denosumab (PROLIA) 60 MG/ML SOLN injection Inject 60 mg into the skin every 6 (six) months. Administer in upper arm, thigh, or abdomen    Historical Provider, MD  denosumab (XGEVA) 120 MG/1.7ML SOLN injection Inject 120 mg into the skin every 30 (thirty) days.     Historical Provider, MD  lisinopril (PRINIVIL,ZESTRIL) 40 MG tablet Take 0.5 tablets (20 mg total) by mouth 2 (two) times daily. 06/19/16   Burtis Junes, NP  LORazepam (ATIVAN) 0.5 MG tablet Take 1 tablet (0.5 mg total) by mouth at bedtime as needed for sleep. 11/29/13   Hosie Poisson, MD  Polyvinyl Alcohol-Povidone (REFRESH OP) Place 1 drop into both eyes daily as needed (dry eyes).    Historical Provider, MD  Probiotic  Product (PROBIOTIC ADVANCED PO) Take 1 tablet by mouth every morning.     Historical Provider, MD  RAPAFLO 8 MG CAPS capsule Take 8 mg by mouth every evening. 06/07/16   Historical Provider, MD  sodium chloride (OCEAN) 0.65 % SOLN nasal spray Place 1 spray into both nostrils as needed for congestion.    Historical Provider, MD    Physical Exam: Vitals:   09/10/16 1600 09/10/16 1615 09/10/16 1630 09/10/16 1645  BP: 121/87 141/82 140/88 156/83  Pulse:  69 60 61  Resp: 17 25 22 22   Temp:      TempSrc:      SpO2:  98% 98% 100%  Weight:      Height:          Constitutional: NAD, calm,  comfortable Eyes: PERTLA, lids and conjunctivae normal ENMT: Mucous membranes are moist. Posterior pharynx clear of any exudate or lesions. Normal dentition.  Neck: normal, supple, no masses, no thyromegaly Respiratory: clear to auscultation bilaterally, no wheezing, no crackles. Normal respiratory effort. No accessory muscle use.  Cardiovascular: S1 & S2 heard, regular rate and rhythm, no murmurs / rubs / gallops. No extremity edema. 2+ pedal pulses. No carotid bruits.  Abdomen: No distension, no tenderness, no masses palpated. No hepatosplenomegaly. Bowel sounds normal.  Musculoskeletal: no clubbing / cyanosis. No joint deformity upper and lower extremities. Good ROM, no contractures. Normal muscle tone. Has 4/5 strength in his left leg and arm. Coarse intention tremors present in all extremities Skin: no rashes, lesions, ulcers. No induration Neurologic: CN 2-12 grossly intact. Sensation intact, DTR normal. Strength 5/5 in all 4 limbs.  Psychiatric: Normal judgment and insight. Alert and oriented x 3. Normal mood.     Labs on Admission: I have personally reviewed following labs and imaging studies  CBC:  Recent Labs Lab 09/10/16 1325  WBC 12.2*  NEUTROABS 8.6*  HGB 13.3  HCT 39.5  MCV 97.5  PLT [id number]   Basic Metabolic Panel:  Recent Labs Lab 09/10/16 1325  NA 137  K 4.0  CL 102  CO2  26  GLUCOSE 92  BUN 25*  CREATININE 1.34*  CALCIUM 8.9   GFR: Estimated Creatinine Clearance: 35.5 mL/min (by C-G formula based on SCr of 1.34 mg/dL (H)). Liver Function Tests:  Recent Labs Lab 09/10/16 1325  AST 22  ALT 14*  ALKPHOS 59  BILITOT 0.8  PROT 6.1*  ALBUMIN 3.6   No results for input(s): LIPASE, AMYLASE in the last 168 hours. No results for input(s): AMMONIA in the last 168 hours. Coagulation Profile: No results for input(s): INR, PROTIME in the last 168 hours. Cardiac Enzymes:  Recent Labs Lab 09/10/16 1554  TROPONINI 0.03*   BNP (last 3 results) No results for input(s): PROBNP in the last 8760 hours. HbA1C: No results for input(s): HGBA1C in the last 72 hours. CBG: No results for input(s): GLUCAP in the last 168 hours. Lipid Profile: No results for input(s): CHOL, HDL, LDLCALC, TRIG, CHOLHDL, LDLDIRECT in the last 72 hours. Thyroid Function Tests: No results for input(s): TSH, T4TOTAL, FREET4, T3FREE, THYROIDAB in the last 72 hours. Anemia Panel: No results for input(s): VITAMINB12, FOLATE, FERRITIN, TIBC, IRON, RETICCTPCT in the last 72 hours. Urine analysis:    Component Value Date/Time   COLORURINE YELLOW 09/10/2016 1707   APPEARANCEUR HAZY (A) 09/10/2016 1707   LABSPEC 1.018 09/10/2016 1707   PHURINE 5.0 09/10/2016 1707   GLUCOSEU NEGATIVE 09/10/2016 1707   HGBUR NEGATIVE 09/10/2016 1707   BILIRUBINUR NEGATIVE 09/10/2016 1707   KETONESUR NEGATIVE 09/10/2016 1707   PROTEINUR 30 (A) 09/10/2016 1707   UROBILINOGEN 0.2 12/18/2014 2111   NITRITE NEGATIVE 09/10/2016 1707   LEUKOCYTESUR MODERATE (A) 09/10/2016 1707   Sepsis Labs: @LABRCNTIP (procalcitonin:4,lacticidven:4) )No results found for this or any previous visit (from the past 240 hour(s)).   Radiological Exams on Admission: Dg Chest 2 View  Result Date: 09/10/2016 CLINICAL DATA:  Syncope. EXAM: CHEST  2 VIEW COMPARISON:  08/27/2016 . FINDINGS: Mediastinum and hilar structures are  normal. COPD. No focal infiltrate. Calcified pulmonary nodules noted bilaterally consistent with granulomas. Left base pleuroparenchymal thickening again noted consistent with scarring. No pleural effusion or pneumothorax. Heart size normal. Old right rib fractures. IMPRESSION: No acute cardiopulmonary disease. COPD. Left base pleuroparenchymal thickening consistent scarring noted . Electronically  Signed   ByMarcello Moores  Register   On: 09/10/2016 14:35   Ct Head Wo Contrast  Result Date: 09/10/2016 CLINICAL DATA:  83 year old male with altered mental status, possible syncope episode. Initial encounter. EXAM: CT HEAD WITHOUT CONTRAST TECHNIQUE: Contiguous axial images were obtained from the base of the skull through the vertex without intravenous contrast. COMPARISON:  Brain MRI 10/07/2016 and earlier. FINDINGS: Brain: Stable gray-white matter differentiation throughout the brain. No midline shift, ventriculomegaly, mass effect, evidence of mass lesion, intracranial hemorrhage or evidence of cortically based acute infarction. Incidental dural calcification. Vascular: Calcified atherosclerosis at the skull base. No suspicious intracranial vascular hyperdensity. Skull: Stable visualized osseous structures. Sinuses/Orbits: Previous mastoidectomies. Visualized paranasal sinuses and mastoids are stable and well pneumatized. Other: No acute orbit or scalp soft tissue finding. IMPRESSION: No acute intracranial abnormality. Stable non contrast CT appearance of the brain. Electronically Signed   By: Genevie Ann M.D.   On: 09/10/2016 14:19    EKG: Independently reviewed. NSR  Assessment/Plan Principal Problem:   Unresponsive episode -With vomiting during the episode- ? Seizure - 2 spells of "shaking" followed by severe fatigue -The ER has spoken with neurology and the neurologist on call has recommended admission for an EEG which I have ordered -Continue to observe for further spells  Active Problems: Leukocytosis/  UTI  -  Start Keflex- f/u U culture    Essential hypertension -Hold lisinopril due to mild creatinine increase  History of TIAs -Continue Plavix and STatin  AKI - baseline Cr 0.0- currently is 1.34 with BUN elevated to 25 -Hold lisinopril-slow normal saline infusion ordered    Tremor  ? Senile tremor ? Due to deconditioning -Commended follow-up with his primary neurologist, Dr. Jannifer Franklin and continued physical therapy  History of prostate cancer -Continue Casodex - also on Lupron monthly    DVT prophylaxis:  Lovenox Code Status: DNR  Family Communication: daughter and son  Disposition Plan: return to SNF tomorrow  Consults called: Neuro called by ER - Dr Lawana Pai who will see the patient tomorrow Admission status: observation    [facility] MD [facility] Pager: [email or web address] Password [email or web address] [phone], please contact night-coverage   09/10/2016, 5:42 PM

## 2016-09-10 NOTE — ED Notes (Signed)
Patient is denying dizziness.

## 2016-09-10 NOTE — ED Triage Notes (Signed)
Patient arrives [facility] with poss syncopal episode. Patient was taking a nap and started to gurgled and it concerned wife and caregiver. They attempted to arouse him and it was difficult. Patient states he could hear his wife and caregiver calling to him but just took some time to wake up. EMS v/s 108/72, HR 60s, 99% RA. fsbs 112. IV 20 in [place]. Hx htn, TIA.

## 2016-09-10 NOTE — ED Notes (Signed)
Pt returned from CT and X-ray.

## 2016-09-11 ENCOUNTER — Observation Stay (HOSPITAL_COMMUNITY): Payer: Medicare Other

## 2016-09-11 DIAGNOSIS — R55 Syncope and collapse: Secondary | ICD-10-CM | POA: Diagnosis present

## 2016-09-11 DIAGNOSIS — Z8546 Personal history of malignant neoplasm of prostate: Secondary | ICD-10-CM | POA: Diagnosis not present

## 2016-09-11 DIAGNOSIS — N390 Urinary tract infection, site not specified: Secondary | ICD-10-CM | POA: Diagnosis not present

## 2016-09-11 DIAGNOSIS — I10 Essential (primary) hypertension: Secondary | ICD-10-CM | POA: Diagnosis not present

## 2016-09-11 DIAGNOSIS — G9340 Encephalopathy, unspecified: Secondary | ICD-10-CM | POA: Diagnosis present

## 2016-09-11 DIAGNOSIS — Z9842 Cataract extraction status, left eye: Secondary | ICD-10-CM | POA: Diagnosis not present

## 2016-09-11 DIAGNOSIS — Z880 Allergy status to penicillin: Secondary | ICD-10-CM | POA: Diagnosis not present

## 2016-09-11 DIAGNOSIS — Z7982 Long term (current) use of aspirin: Secondary | ICD-10-CM | POA: Diagnosis not present

## 2016-09-11 DIAGNOSIS — A419 Sepsis, unspecified organism: Principal | ICD-10-CM

## 2016-09-11 DIAGNOSIS — K219 Gastro-esophageal reflux disease without esophagitis: Secondary | ICD-10-CM | POA: Diagnosis present

## 2016-09-11 DIAGNOSIS — F411 Generalized anxiety disorder: Secondary | ICD-10-CM | POA: Diagnosis present

## 2016-09-11 DIAGNOSIS — I2510 Atherosclerotic heart disease of native coronary artery without angina pectoris: Secondary | ICD-10-CM | POA: Diagnosis present

## 2016-09-11 DIAGNOSIS — G8929 Other chronic pain: Secondary | ICD-10-CM | POA: Diagnosis present

## 2016-09-11 DIAGNOSIS — I471 Supraventricular tachycardia: Secondary | ICD-10-CM | POA: Diagnosis present

## 2016-09-11 DIAGNOSIS — C7951 Secondary malignant neoplasm of bone: Secondary | ICD-10-CM | POA: Diagnosis present

## 2016-09-11 DIAGNOSIS — R251 Tremor, unspecified: Secondary | ICD-10-CM | POA: Diagnosis not present

## 2016-09-11 DIAGNOSIS — Z87891 Personal history of nicotine dependence: Secondary | ICD-10-CM | POA: Diagnosis not present

## 2016-09-11 DIAGNOSIS — G4700 Insomnia, unspecified: Secondary | ICD-10-CM | POA: Diagnosis present

## 2016-09-11 DIAGNOSIS — R4189 Other symptoms and signs involving cognitive functions and awareness: Secondary | ICD-10-CM

## 2016-09-11 DIAGNOSIS — M545 Low back pain: Secondary | ICD-10-CM | POA: Diagnosis present

## 2016-09-11 DIAGNOSIS — Z9841 Cataract extraction status, right eye: Secondary | ICD-10-CM | POA: Diagnosis not present

## 2016-09-11 DIAGNOSIS — Z96651 Presence of right artificial knee joint: Secondary | ICD-10-CM | POA: Diagnosis present

## 2016-09-11 DIAGNOSIS — N179 Acute kidney failure, unspecified: Secondary | ICD-10-CM | POA: Diagnosis present

## 2016-09-11 DIAGNOSIS — I4891 Unspecified atrial fibrillation: Secondary | ICD-10-CM | POA: Diagnosis not present

## 2016-09-11 DIAGNOSIS — Z8673 Personal history of transient ischemic attack (TIA), and cerebral infarction without residual deficits: Secondary | ICD-10-CM | POA: Diagnosis not present

## 2016-09-11 DIAGNOSIS — Z66 Do not resuscitate: Secondary | ICD-10-CM | POA: Diagnosis present

## 2016-09-11 DIAGNOSIS — Z79899 Other long term (current) drug therapy: Secondary | ICD-10-CM | POA: Diagnosis not present

## 2016-09-11 LAB — CBC
HEMATOCRIT: 43.7 % (ref 39.0–52.0)
HEMOGLOBIN: 15 g/dL (ref 13.0–17.0)
MCH: 33 pg (ref 26.0–34.0)
MCHC: 34.3 g/dL (ref 30.0–36.0)
MCV: 96.3 fL (ref 78.0–100.0)
Platelets: 158 10*3/uL (ref 150–400)
RBC: 4.54 MIL/uL (ref 4.22–5.81)
RDW: 13.6 % (ref 11.5–15.5)
WBC: 12.2 10*3/uL — ABNORMAL HIGH (ref 4.0–10.5)

## 2016-09-11 LAB — BASIC METABOLIC PANEL
ANION GAP: 12 (ref 5–15)
BUN: 26 mg/dL — ABNORMAL HIGH (ref 6–20)
CO2: 22 mmol/L (ref 22–32)
Calcium: 8.9 mg/dL (ref 8.9–10.3)
Chloride: 102 mmol/L (ref 101–111)
Creatinine, Ser: 1.23 mg/dL (ref 0.61–1.24)
GFR calc Af Amer: 59 mL/min — ABNORMAL LOW (ref >60)
GFR calc non Af Amer: 51 mL/min — ABNORMAL LOW (ref >60)
GLUCOSE: 97 mg/dL (ref 65–99)
POTASSIUM: 3.9 mmol/L (ref 3.5–5.1)
Sodium: 136 mmol/L (ref 135–145)

## 2016-09-11 LAB — MRSA PCR SCREENING: MRSA by PCR: NEGATIVE

## 2016-09-11 LAB — LACTIC ACID, PLASMA: Lactic Acid, Venous: 1.4 mmol/L (ref 0.5–1.9)

## 2016-09-11 LAB — INFLUENZA PANEL BY PCR (TYPE A & B)
INFLBPCR: NEGATIVE
Influenza A By PCR: NEGATIVE

## 2016-09-11 MED ORDER — ACETAMINOPHEN 325 MG PO TABS: 325.0000 mg | ORAL_TABLET | Freq: Once | ORAL | Status: DC

## 2016-09-11 MED ORDER — DEXTROSE 5 % IV SOLN
1.0000 g | INTRAVENOUS | Status: DC
  Administered 2016-09-11: 1 g via INTRAVENOUS
  Filled 2016-09-11: qty 10

## 2016-09-11 MED ORDER — ENSURE ENLIVE PO LIQD
237.0000 mL | Freq: Three times a day (TID) | ORAL | Status: DC
  Administered 2016-09-11 – 2016-09-14 (×4): 237 mL via ORAL

## 2016-09-11 NOTE — ED Provider Notes (Signed)
[place] [facility]PT [facility] Provider Note   CSN: [id number] Arrival date & time: 09/10/16  1225     History   Chief Complaint No chief complaint on file.   HPI Craig [name] is a 83 y.o. male.  83 year old Caucasian male with a past medical history significant for CAD, chronic back pain, TIAs, hypertension that presents to the ED today for possible syncopal episode. Patient is currently receiving rehabilitation and skilled nursing facility. Son and daughter at bedside. They state that the patient was sleeping today when a tried to wake him up. He would not awaken. Caregiver states that he began to make a gurgling sound and proceeded to have an episode of emesis while unconscious. The aide continue to state the patient and tried to sit him up where he eventually regained consciousness. When I asked the patient states that he was able to hear the caregiver calling his name during this episode but he felt like he could not move or talk. Upon further questioning the family states the patient has had 2 episodes of sudden shaking with eventual collapse. These episodes were followed by severe weakness and confusion. Patient has difficulty speaking or these episodes. Daughter states he is continuing to have lower extremities weakness since the initial episode. The first episode occurred on 1/16 when he presented to [facility] ED for evaluation. At that time he received a thorough workup to include CAT scan and MRI of the brain which were all negative. Labs were negative that time. He was discharged to rehabilitation facility when he would undergo PTOT to regain his strength. The daughter also notes that the patient has developed a tremor over the past 3 weeks that is present most of his extremities when he moves them. The daughter also feels the patient may have an UTI. She sent his urine to his urologist. They have not called him with the results at this time. Patient denies any urinary complaints.  Daughter reports low-grade temperature of 99.9. Patient denies any complaints at this time. He denies any fever, chills, headache, vision changes, lightheadedness, dizziness, chest pain, shortness of breath, abdominal pain, nausea, emesis, urinary symptoms, change in bowel habits, numbness/tingling. Patient unable to ambulate which has been baseline since 2 days ago after his second episode. The family is concerned that he may have had a possible seizure or a another TIA. Denies any trauma during falls. Of note patient is a poor historian.        Past Medical History:  Diagnosis Date  . Anxiety state, unspecified   . CAD (coronary artery disease) 11/20/13  . Carbon monoxide exposure 11/24/13  . Cervicalgia   . Degenerative disc disease   . Degenerative joint disease   . Diverticulitis   . Diverticulosis   . Ejection fraction   . Genital herpes   . Genital herpes 07/10/2013  . GERD (gastroesophageal reflux disease)   . Hip arthritis 09/29/2013   Bilateral  . Hypertension   . Hyponatremia 11/24/13  . Hypoxia 11/24/13  . Insomnia 07/09/2013  . Lumbago   . Lumbosacral spondylolysis   . Lumbosacral spondylosis without myelopathy 09/29/2013  . Malignant neoplasm of prostate ([place])   . NSTEMI (non-ST elevated myocardial infarction) ([place]) 11/20/13  . OA (osteoarthritis) of knee 06/21/2013  . Other and unspecified hyperlipidemia   . Pneumonia 11/24/13  . Postoperative anemia due to acute blood loss 06/22/2013  . Prostate cancer ([place]) 2012   mets to bone  . TIA (transient ischemic attack)  Patient Active Problem List   Diagnosis Date Noted  . Unresponsive episode 09/10/2016  . Tremor 09/10/2016  . Ejection fraction   . HCAP (healthcare-associated pneumonia) 11/27/2013  . Hypoxia 11/25/2013  . Carbon monoxide exposure 11/25/2013  . Carboxyhemoglobinemia 11/25/2013  . NSTEMI (non-ST elevated myocardial infarction) ([place]) 11/21/2013  . CAP (community acquired pneumonia) 11/21/2013  .  Hyperlipidemia 11/21/2013  . Lumbosacral spondylosis without myelopathy 09/29/2013  . Hip arthritis 09/29/2013  . GERD (gastroesophageal reflux disease) 07/13/2013  . Genital herpes 07/10/2013  . Essential hypertension 07/10/2013  . Insomnia 07/09/2013  . TIA (transient ischemic attack) 06/24/2013  . Postoperative anemia due to acute blood loss 06/22/2013  . Hyponatremia 06/22/2013  . OA (osteoarthritis) of knee 06/21/2013  . Prostate cancer ([place]) 04/14/2012    Past Surgical History:  Procedure Laterality Date  . CATARACT EXTRACTION Bilateral   . MASTOIDECTOMY Bilateral   . TONSILLECTOMY    . TOTAL KNEE ARTHROPLASTY Right 06/21/2013   Procedure: RIGHT TOTAL KNEE ARTHROPLASTY;  Surgeon: Gearlean Alf, MD;  Location: [facility]S;  Service: Orthopedics;  Laterality: Right;. Dr. Wynelle Link       Home Medications    Prior to Admission medications   Medication Sig Start Date End Date Taking? Authorizing Provider  acetaminophen (TYLENOL) 500 MG tablet Take 1,000 mg by mouth at bedtime.   Yes Historical Provider, MD  B Complex-C (B-COMPLEX WITH VITAMIN C) tablet Take 1 tablet by mouth every morning.    Yes Historical Provider, MD  bicalutamide (CASODEX) 50 MG tablet Take 50 mg by mouth every morning.  09/16/13  Yes Historical Provider, MD  chlorhexidine (PERIDEX) 0.12 % solution Use as directed 15 mLs in the mouth or throat daily as needed (mouth rinse).   Yes Historical Provider, MD  clopidogrel (PLAVIX) 75 MG tablet Take 75 mg by mouth every morning.    Yes Historical Provider, MD  denosumab (XGEVA) 120 MG/1.7ML SOLN injection Inject 120 mg into the skin every 30 (thirty) days.    Yes Historical Provider, MD  leuprolide (LUPRON) 30 MG injection Inject 30 mg into the muscle every 4 (four) months.   Yes Historical Provider, MD  lisinopril (PRINIVIL,ZESTRIL) 40 MG tablet Take 0.5 tablets (20 mg total) by mouth 2 (two) times daily. 06/19/16  Yes Burtis Junes, NP  LORazepam (ATIVAN) 0.5 MG  tablet Take 1 tablet (0.5 mg total) by mouth at bedtime as needed for sleep. Patient taking differently: Take 0.5 mg by mouth at bedtime.  11/29/13  Yes Hosie Poisson, MD  memantine (NAMENDA) 5 MG tablet Take 5 mg by mouth daily.   Yes Historical Provider, MD  omeprazole (PRILOSEC) 20 MG capsule Take 20 mg by mouth daily as needed (heartburn).   Yes Historical Provider, MD  Polyethyl Glycol-Propyl Glycol (SYSTANE) 0.4-0.3 % SOLN Apply 1 drop to eye daily.   Yes Historical Provider, MD  Probiotic Product (PROBIOTIC ADVANCED PO) Take 1 tablet by mouth every morning.    Yes Historical Provider, MD    Family History Family History  Problem Relation Age of Onset  . Heart disease Mother     Social History Social History  Substance Use Topics  . Smoking status: Former Smoker    Packs/day: 2.00    Years: 15.00    Quit date: 01/14/1984  . Smokeless tobacco: Never Used  . Alcohol use 1.2 oz/week    2 Shots of liquor per week     Comment: occasional     Allergies   Penicillins  Review of Systems Review of Systems  Constitutional: Negative for chills and fever.  HENT: Negative for congestion.   Eyes: Negative for visual disturbance.  Respiratory: Negative for cough and shortness of breath.   Cardiovascular: Negative for chest pain, palpitations and leg swelling.  Gastrointestinal: Negative for abdominal pain, diarrhea, nausea and vomiting.  Genitourinary: Negative for dysuria, frequency and urgency.  Skin: Negative.   Neurological: Negative for dizziness, syncope, weakness, light-headedness, numbness and headaches.  All other systems reviewed and are negative.    Physical Exam Updated Vital Signs BP 121/75 (BP Location: Right Arm)   Pulse 80   Temp 99.5 F (37.5 C) (Oral)   Resp (!) 23   Ht 5\' 10"  (1.778 m)   Wt 62.2 kg   SpO2 97%   BMI 19.67 kg/m   Physical Exam  Constitutional: He is oriented to person, place, and time. He appears well-developed and well-nourished. No  distress.  HENT:  Head: Normocephalic and atraumatic.  Mouth/Throat: Oropharynx is clear and moist.  Eyes: Conjunctivae and EOM are normal. Pupils are equal, round, and reactive to light. Right eye exhibits no discharge. Left eye exhibits no discharge. No scleral icterus.  Neck: Normal range of motion. Neck supple. No thyromegaly present.  Cardiovascular: Normal rate, regular rhythm, normal heart sounds and intact distal pulses.  Exam reveals no gallop and no friction rub.   No murmur heard. Pulmonary/Chest: Effort normal and breath sounds normal. No respiratory distress.  Abdominal: Soft. Bowel sounds are normal. He exhibits no distension. There is no tenderness. There is no rebound and no guarding.  Musculoskeletal: Normal range of motion.  Tremors noted in all 4 extremities with movement. Strength is 5 out of 5 in upper extremities and forearms 5 in lower extremities. Daughter states is baseline from his initial episode 2 weeks ago.  Lymphadenopathy:    He has no cervical adenopathy.  Neurological: He is alert and oriented to person, place, and time.  The patient is alert, attentive, and oriented x 3. Speech is clear. Cranial nerve II-VII grossly intact. Negative pronator drift. Sensation intact.  Reflexes 2+ and symmetric at biceps, triceps, knees, and ankles. Rapid alternating movement and fine finger movements intact. Did not assess Romberg or gait due to lower extremity weakness.   Skin: Skin is warm and dry. Capillary refill takes less than 2 seconds.  Nursing note and vitals reviewed.    ED Treatments / Results  Labs (all labs ordered are listed, but only abnormal results are displayed) Labs Reviewed  CBC WITH DIFFERENTIAL/PLATELET - Abnormal; Notable for the following:       Result Value   WBC 13.4 (*)    Neutro Abs 9.4 (*)    Monocytes Absolute 1.7 (*)    All other components within normal limits  COMPREHENSIVE METABOLIC PANEL - Abnormal; Notable for the following:     Chloride 98 (*)    Glucose, Bld 119 (*)    BUN 26 (*)    ALT 16 (*)    GFR calc non Af Amer 52 (*)    All other components within normal limits  URINALYSIS, ROUTINE W REFLEX MICROSCOPIC - Abnormal; Notable for the following:    APPearance HAZY (*)    Protein, ur 30 (*)    Leukocytes, UA MODERATE (*)    Bacteria, UA MANY (*)    All other components within normal limits  CBC WITH DIFFERENTIAL/PLATELET - Abnormal; Notable for the following:    WBC 12.2 (*)    RBC  4.05 (*)    Neutro Abs 8.6 (*)    Monocytes Absolute 1.5 (*)    All other components within normal limits  COMPREHENSIVE METABOLIC PANEL - Abnormal; Notable for the following:    BUN 25 (*)    Creatinine, Ser 1.34 (*)    Total Protein 6.1 (*)    ALT 14 (*)    GFR calc non Af Amer 46 (*)    GFR calc Af Amer 53 (*)    All other components within normal limits  TROPONIN I - Abnormal; Notable for the following:    Troponin I 0.03 (*)    All other components within normal limits  BASIC METABOLIC PANEL - Abnormal; Notable for the following:    BUN 26 (*)    GFR calc non Af Amer 51 (*)    GFR calc Af Amer 59 (*)    All other components within normal limits  CBC - Abnormal; Notable for the following:    WBC 12.2 (*)    All other components within normal limits  MRSA PCR SCREENING  CULTURE, BLOOD (ROUTINE X 2)  CULTURE, BLOOD (ROUTINE X 2)  URINE CULTURE  INFLUENZA PANEL BY PCR (TYPE A & B)  LACTIC ACID, PLASMA  GLUCOSE, CAPILLARY  CBG MONITORING, ED  I-STAT TROPOININ, ED  I-STAT TROPOININ, ED    EKG  EKG Interpretation  Date/Time:  Tuesday September 10 2016 13:18:27 EST Ventricular Rate:  70 PR Interval:  152 QRS Duration: 70 QT Interval:  432 QTC Calculation: 466 R Axis:   88 Text Interpretation:  Sinus rhythm with Premature supraventricular complexes Septal infarct , age undetermined Abnormal ECG since last tracing no significant change Confirmed by BELFI  MD, MELANIE ([id number]) on 09/10/2016 3:13:15 PM        Radiology Dg Chest 2 View  Result Date: 09/10/2016 CLINICAL DATA:  Syncope. EXAM: CHEST  2 VIEW COMPARISON:  08/27/2016 . FINDINGS: Mediastinum and hilar structures are normal. COPD. No focal infiltrate. Calcified pulmonary nodules noted bilaterally consistent with granulomas. Left base pleuroparenchymal thickening again noted consistent with scarring. No pleural effusion or pneumothorax. Heart size normal. Old right rib fractures. IMPRESSION: No acute cardiopulmonary disease. COPD. Left base pleuroparenchymal thickening consistent scarring noted . Electronically Signed   By: Marcello Moores  Register   On: 09/10/2016 14:35   Ct Head Wo Contrast  Result Date: 09/10/2016 CLINICAL DATA:  83 year old male with altered mental status, possible syncope episode. Initial encounter. EXAM: CT HEAD WITHOUT CONTRAST TECHNIQUE: Contiguous axial images were obtained from the base of the skull through the vertex without intravenous contrast. COMPARISON:  Brain MRI 10/07/2016 and earlier. FINDINGS: Brain: Stable gray-white matter differentiation throughout the brain. No midline shift, ventriculomegaly, mass effect, evidence of mass lesion, intracranial hemorrhage or evidence of cortically based acute infarction. Incidental dural calcification. Vascular: Calcified atherosclerosis at the skull base. No suspicious intracranial vascular hyperdensity. Skull: Stable visualized osseous structures. Sinuses/Orbits: Previous mastoidectomies. Visualized paranasal sinuses and mastoids are stable and well pneumatized. Other: No acute orbit or scalp soft tissue finding. IMPRESSION: No acute intracranial abnormality. Stable non contrast CT appearance of the brain. Electronically Signed   By: Genevie Ann M.D.   On: 09/10/2016 14:19    Procedures Procedures (including critical care time)  Medications Ordered in ED Medications  atorvastatin (LIPITOR) tablet 10 mg (10 mg Oral Given 09/11/16 0827)  B-complex with vitamin C tablet 1 tablet (1  tablet Oral Given 09/11/16 0827)  bicalutamide (CASODEX) tablet 50 mg (50 mg Oral Given 09/11/16  [id number])  clopidogrel (PLAVIX) tablet 75 mg (75 mg Oral Given 09/11/16 0827)  LORazepam (ATIVAN) tablet 0.5 mg (not administered)  tamsulosin (FLOMAX) capsule 0.4 mg (0.4 mg Oral Given 09/10/16 2031)  0.9 %  sodium chloride infusion ( Intravenous Rate/Dose Verify 09/11/16 0400)  enoxaparin (LOVENOX) injection 40 mg (40 mg Subcutaneous Given 09/11/16 0828)  sodium chloride flush (NS) 0.9 % injection 3 mL (3 mLs Intravenous Given 09/11/16 0828)  sodium chloride flush (NS) 0.9 % injection 3 mL (not administered)  0.9 %  sodium chloride infusion (not administered)  acetaminophen (TYLENOL) tablet 650 mg (650 mg Oral Given 09/11/16 0229)    Or  acetaminophen (TYLENOL) suppository 650 mg ( Rectal See Alternative 09/11/16 0229)  ondansetron (ZOFRAN) tablet 4 mg (not administered)    Or  ondansetron (ZOFRAN) injection 4 mg (not administered)  cephALEXin (KEFLEX) capsule 500 mg (500 mg Oral Given 09/11/16 0827)  acetaminophen (TYLENOL) tablet 325 mg (not administered)  feeding supplement (ENSURE ENLIVE) (ENSURE ENLIVE) liquid 237 mL (not administered)     Initial Impression / Assessment and Plan / ED Course  I have reviewed the triage vital signs and the nursing notes.  Pertinent labs & imaging results that were available during my care of the patient were reviewed by me and considered in my medical decision making (see chart for details).  Clinical Course   Patient presents to the ED with possible syncopal episode versus seizure. He is coming from a rehabilitation skilled nursing facility by EMS. Daughter and son are at bedside. Initial workup has been relatively unremarkable. Mild leukocytosis of 12. Chest x-ray was unremarkable. Head CT was unremarkable for any acute findings. Patient has no focal neuro deficits. He is at his baseline with his lower extremities weakness. He is alert and oriented 4. Patient had  normal MRI approximately one week ago. Did not feel MRI today is warranted. Daughter was concerned for urinary tract infection. Awaiting patient to provide urine sample. Patient denies any urinary symptoms. Mild AKI with a creatinine of 1.34. Slightly up from baseline. EKG without any acute findings. Initial troponin was negative. Mild leukocytosis possibly due to UTI. No other infectious symptoms including cough, URI, nausea, vomiting, diarrhea. Spoke with Dr. Lawana Pai with neurology who recommends admission for an EEG to rule out questionable seizure activity. Does not feel MRI is warranted at this time. Feels that patient could be developing early parkinsonian symptoms. Patient does have a history of TIA does not feel that this is related. States the patient is to be admitted to the hospital service. We'll consult in the a.m. Spoke with Dr. Wynelle Cleveland who agrees to admit patient to telemetry. Orders were placed. Patient was seen and evaluated by Dr. Tamera Punt who agrees with the above plan. Patient is hemodynamically stable this time. Vital signs are normal. He is in no acute distress.  Final Clinical Impressions(s) / ED Diagnoses   Final diagnoses:  Syncope, unspecified syncope type    New Prescriptions Current Discharge Medication List       Doristine Devoid, PA-C 09/11/16 [place], MD 09/14/16 1526

## 2016-09-11 NOTE — Progress Notes (Signed)
EEG completed; results pending.    

## 2016-09-11 NOTE — Progress Notes (Signed)
Around 8pm had oral temperature 100.2 F, administered 2 tylenols.   0030, patient had rigors and a rectal temp 101.3 F Patient was having a conversation with me, alert & oriented x3 (disoriented in place)   Paged Baltazar Najjar NP with [facility]. She ordered blood cultures, lactic acid, Influenza panel (pending), and droplet precautions.   Vigorous shaking lasted for < 30 minutes. Pt was hot and flushed. Administration of Tylenol 650 mg PO. Did have mild confusion. Temperature down to 99.5 oral. Patient was more alert and correctly answering my questions.  Planned EEG today. Currently sleeping Will continue to monitor.

## 2016-09-11 NOTE — Care Management Note (Addendum)
Case Management Note  Patient Details  Name: DRAYMOND SUMP MRN: [id number] Date of Birth: 1926/04/07  Subjective/Objective:   Pt presented for being difficult to arouse. Pt is from [facility] Per daughter she would like pt to go to SNF @ [facility]. Pt will need PT/OT to consult as well.                     Action/Plan: CSW is monitoring for disposition needs. CM will continue to follow as well.   Expected Discharge Date:                  Expected Discharge Plan:  Skilled Nursing Facility  In-House Referral:  Clinical Social Work  Discharge planning Services  CM Consult  Post Acute Care Choice:    Choice offered to:     DME Arranged:    DME Agency:     HH Arranged:    [place] Agency:     Status of Service:  In process, will continue to follow  If discussed at Long Length of Stay Meetings, dates discussed:    Additional Comments: [id number] 09-13-16 Jacqlyn Krauss, RN,BSN [phone] PT consult- recommendations for SNF. CSW aware. CM will continue to monitor for additional needs.  Bethena Roys, RN 09/11/2016, 11:28 AM

## 2016-09-11 NOTE — Progress Notes (Addendum)
PROGRESS NOTE    Craig [name]   [id number]  DOB: 11/07/1925  DOA: 09/10/2016 PCP: Myriam Jacobson, MD   Brief Narrative:  Craig [name] is a 83 y.o. male with medical history significant of chronic back pain with degenerative disc disease and compression fractures, TIA who presents for an episode today where he was difficult to arouse. According to the son and daughter, the patient was sleeping but when his aide tried to wake him up, he would not awaken. He began to gurgle and subsequently vomited while unconscious. His aide began to sit him up and shake him and eventually he was able to regain consciousness. He states that during the time where he was not responding, he was able to hear some things that were happening but could not talk or move. Family goes on to explain that he has had 2 episodes where he had sudden shaking followed by severe weakness. During these episodes the patient was not able to speak. After one such episode, on 1/16, he presented to the ER and underwent a CT scan and MRI of the brain which were negative. He is currently receiving rehabilitation at a skilled nursing facility. His son and daughter have noted that he has developed a tremor over the past 3 weeks which is mostly present in his extremities when he moves them. His daughter also felt that he may have a UTI and his urine was sent to his PCP for UA and culture.  Subjective: Feels very fatigued. No new complaints.   Assessment & Plan:  Unresponsive episode -With vomiting during the episode- ? Seizure - head CT unrevealing - 2 spells of "shaking" followed by severe fatigue prior to admission- he had an episode of rigors while in the hospital last night due to a fever-  -The ER spoke with neurology and the neurologist on call has recommended admission for an EEG which I have ordered -Continue to observe for further spells  Active Problems: Leukocytosis/ UTI  -  Started Keflex- f/u U culture  and post void residuals  Essential hypertension -Hold lisinopril due to mild creatinine increase  History of TIAs -Continue Plavix and STatin  AKI - baseline Cr 0.0- currently is 1.34 with BUN elevated to 25 -Hold lisinopril-slow normal saline infusion ordered  Tremor  ? Senile tremor ? Due to deconditioning -recommended follow-up with his primary neurologist, Dr. Jannifer Franklin   History of prostate cancer -Continue Casodex - also on Lupron monthly  DVT prophylaxis:  Lovenox Code Status: Full code Family Communication:  With daughter and son Disposition Plan: SNF Consultants:    Procedures:    Antimicrobials:  Anti-infectives    Start     Dose/Rate Route Frequency Ordered Stop   09/10/16 1830  cephALEXin (KEFLEX) capsule 500 mg     500 mg Oral Every 12 hours 09/10/16 1751         Objective: Vitals:   09/10/16 1827 09/10/16 2015 09/10/16 2048 09/11/16 0315  BP: (!) 166/84 (!) 145/85  121/75  Pulse:  81 80   Resp:  19 (!) 23   Temp: 99.8 F (37.7 C) 100.2 F (37.9 C)  99.5 F (37.5 C)  TempSrc: Oral Oral  Oral  SpO2: 98% 100% 99% 97%  Weight: 63.7 kg (140 lb 8 oz)   62.2 kg (137 lb 1.6 oz)  Height: 5\' 10"  (1.778 m)   5\' 10"  (1.778 m)    Intake/Output Summary (Last 24 hours) at 09/11/16 1214 Last data filed at 09/11/16  0900  Gross per 24 hour  Intake          1923.75 ml  Output              150 ml  Net          1773.75 ml   Filed Weights   09/10/16 1234 09/10/16 1827 09/11/16 0315  Weight: 65.8 kg (145 lb) 63.7 kg (140 lb 8 oz) 62.2 kg (137 lb 1.6 oz)    Examination: General exam: Appears comfortable  HEENT: PERRLA, oral mucosa moist, no sclera icterus or thrush Respiratory system: Clear to auscultation. Respiratory effort normal. Cardiovascular system: S1 & S2 heard, RRR.  No murmurs  Gastrointestinal system: Abdomen soft, non-tender, nondistended. Normal bowel sound. No organomegaly Central nervous system: Alert and oriented. No focal neurological  deficits. Extremities: No cyanosis, clubbing or edema Skin: No rashes or ulcers Psychiatry:  Mood & affect appropriate.     Data Reviewed: I have personally reviewed following labs and imaging studies  CBC:  Recent Labs Lab 09/10/16 1325 09/10/16 1854 09/11/16 0030  WBC 12.2* 13.4* 12.2*  NEUTROABS 8.6* 9.4*  --   HGB 13.3 14.6 15.0  HCT 39.5 42.9 43.7  MCV 97.5 97.3 96.3  PLT 171 178 0000000   Basic Metabolic Panel:  Recent Labs Lab 09/10/16 1325 09/10/16 1854 09/11/16 0030  NA 137 137 136  K 4.0 4.2 3.9  CL 102 98* 102  CO2 26 28 22   GLUCOSE 92 119* 97  BUN 25* 26* 26*  CREATININE 1.34* 1.20 1.23  CALCIUM 8.9 9.3 8.9   GFR: Estimated Creatinine Clearance: 36.5 mL/min (by C-G formula based on SCr of 1.23 mg/dL). Liver Function Tests:  Recent Labs Lab 09/10/16 1325 09/10/16 1854  AST 22 24  ALT 14* 16*  ALKPHOS 59 69  BILITOT 0.8 0.7  PROT 6.1* 7.2  ALBUMIN 3.6 4.1   No results for input(s): LIPASE, AMYLASE in the last 168 hours. No results for input(s): AMMONIA in the last 168 hours. Coagulation Profile: No results for input(s): INR, PROTIME in the last 168 hours. Cardiac Enzymes:  Recent Labs Lab 09/10/16 1554  TROPONINI 0.03*   BNP (last 3 results) No results for input(s): PROBNP in the last 8760 hours. HbA1C: No results for input(s): HGBA1C in the last 72 hours. CBG:  Recent Labs Lab 09/10/16 2356  GLUCAP 89   Lipid Profile: No results for input(s): CHOL, HDL, LDLCALC, TRIG, CHOLHDL, LDLDIRECT in the last 72 hours. Thyroid Function Tests: No results for input(s): TSH, T4TOTAL, FREET4, T3FREE, THYROIDAB in the last 72 hours. Anemia Panel: No results for input(s): VITAMINB12, FOLATE, FERRITIN, TIBC, IRON, RETICCTPCT in the last 72 hours. Urine analysis:    Component Value Date/Time   COLORURINE YELLOW 09/10/2016 1707   APPEARANCEUR HAZY (A) 09/10/2016 1707   LABSPEC 1.018 09/10/2016 1707   PHURINE 5.0 09/10/2016 1707   GLUCOSEU  NEGATIVE 09/10/2016 1707   HGBUR NEGATIVE 09/10/2016 1707   BILIRUBINUR NEGATIVE 09/10/2016 1707   KETONESUR NEGATIVE 09/10/2016 1707   PROTEINUR 30 (A) 09/10/2016 1707   UROBILINOGEN 0.2 12/18/2014 2111   NITRITE NEGATIVE 09/10/2016 1707   LEUKOCYTESUR MODERATE (A) 09/10/2016 1707   Sepsis Labs: @LABRCNTIP (procalcitonin:4,lacticidven:4) ) Recent Results (from the past 240 hour(s))  MRSA PCR Screening     Status: None   Collection Time: 09/11/16 10:24 AM  Result Value Ref Range Status   MRSA by PCR NEGATIVE NEGATIVE Final    Comment:        The GeneXpert  MRSA Assay (FDA approved for NASAL specimens only), is one component of a comprehensive MRSA colonization surveillance program. It is not intended to diagnose MRSA infection nor to guide or monitor treatment for MRSA infections.          Radiology Studies: Dg Chest 2 View  Result Date: 09/10/2016 CLINICAL DATA:  Syncope. EXAM: CHEST  2 VIEW COMPARISON:  08/27/2016 . FINDINGS: Mediastinum and hilar structures are normal. COPD. No focal infiltrate. Calcified pulmonary nodules noted bilaterally consistent with granulomas. Left base pleuroparenchymal thickening again noted consistent with scarring. No pleural effusion or pneumothorax. Heart size normal. Old right rib fractures. IMPRESSION: No acute cardiopulmonary disease. COPD. Left base pleuroparenchymal thickening consistent scarring noted . Electronically Signed   By: Marcello Moores  Register   On: 09/10/2016 14:35   Ct Head Wo Contrast  Result Date: 09/10/2016 CLINICAL DATA:  83 year old male with altered mental status, possible syncope episode. Initial encounter. EXAM: CT HEAD WITHOUT CONTRAST TECHNIQUE: Contiguous axial images were obtained from the base of the skull through the vertex without intravenous contrast. COMPARISON:  Brain MRI 10/07/2016 and earlier. FINDINGS: Brain: Stable gray-white matter differentiation throughout the brain. No midline shift, ventriculomegaly, mass  effect, evidence of mass lesion, intracranial hemorrhage or evidence of cortically based acute infarction. Incidental dural calcification. Vascular: Calcified atherosclerosis at the skull base. No suspicious intracranial vascular hyperdensity. Skull: Stable visualized osseous structures. Sinuses/Orbits: Previous mastoidectomies. Visualized paranasal sinuses and mastoids are stable and well pneumatized. Other: No acute orbit or scalp soft tissue finding. IMPRESSION: No acute intracranial abnormality. Stable non contrast CT appearance of the brain. Electronically Signed   By: Genevie Ann M.D.   On: 09/10/2016 14:19      Scheduled Meds: . acetaminophen  325 mg Oral Once  . atorvastatin  10 mg Oral Daily  . B-complex with vitamin C  1 tablet Oral Daily  . bicalutamide  50 mg Oral Daily  . cephALEXin  500 mg Oral Q12H  . clopidogrel  75 mg Oral Daily  . enoxaparin (LOVENOX) injection  40 mg Subcutaneous Q24H  . feeding supplement (ENSURE ENLIVE)  237 mL Oral TID BM  . sodium chloride flush  3 mL Intravenous Q12H  . tamsulosin  0.4 mg Oral QPC supper   Continuous Infusions: . sodium chloride 75 mL/hr at 09/11/16 0400     LOS: 0 days    Time spent in minutes: 72    [place], MD [facility] Pager: [email or web address] Password [facility] 09/11/2016, 12:14 PM

## 2016-09-11 NOTE — Consult Note (Signed)
Reason for Consult: syncope vs seizure Referring Physician: hospitalist  Craig [name] is an 83 y.o. male.  HPI: Patient present to the ED yesterday for possible syncopal episode. Patient lives at a rehab facility. He had an episode where he was difficult to arouse.  The patient states that he was able to hear someone calling his name during this episode but he felt like he could not move or talk. Upon further questioning the family states the patient has had 2 episodes of sudden shaking with eventual collapse. These episodes were followed by severe weakness and confusion. Patient has difficulty speaking or these episodes.  Patient also mentions some intermittent tremors which can affect his walking/balance.  Presently he feels back to baseline.  Past Medical History:  Diagnosis Date  . Anxiety state, unspecified   . CAD (coronary artery disease) 11/20/13  . Carbon monoxide exposure 11/24/13  . Cervicalgia   . Degenerative disc disease   . Degenerative joint disease   . Diverticulitis   . Diverticulosis   . Ejection fraction   . Genital herpes   . Genital herpes 07/10/2013  . GERD (gastroesophageal reflux disease)   . Hip arthritis 09/29/2013   Bilateral  . Hypertension   . Hyponatremia 11/24/13  . Hypoxia 11/24/13  . Insomnia 07/09/2013  . Lumbago   . Lumbosacral spondylolysis   . Lumbosacral spondylosis without myelopathy 09/29/2013  . Malignant neoplasm of prostate ([place])   . NSTEMI (non-ST elevated myocardial infarction) ([place]) 11/20/13  . OA (osteoarthritis) of knee 06/21/2013  . Other and unspecified hyperlipidemia   . Pneumonia 11/24/13  . Postoperative anemia due to acute blood loss 06/22/2013  . Prostate cancer ([place]) 2012   mets to bone  . TIA (transient ischemic attack)     Past Surgical History:  Procedure Laterality Date  . CATARACT EXTRACTION Bilateral   . MASTOIDECTOMY Bilateral   . TONSILLECTOMY    . TOTAL KNEE ARTHROPLASTY Right 06/21/2013   Procedure: RIGHT TOTAL  KNEE ARTHROPLASTY;  Surgeon: Gearlean Alf, MD;  Location: [facility];  Service: Orthopedics;  Laterality: Right;. Dr. Wynelle Link    Family History  Problem Relation Age of Onset  . Heart disease Mother     Social History:  reports that he quit smoking about 32 years ago. He has a 30.00 pack-year smoking history. He has never used smokeless tobacco. He reports that he drinks about 1.2 oz of alcohol per week . His drug history is not on file.  Allergies:  Allergies  Allergen Reactions  . Penicillins Other (See Comments)    Patient doesn't remember what kind of reaction  Has patient had a PCN reaction causing immediate rash, facial/tongue/throat swelling, SOB or lightheadedness with hypotension: n/a Has patient had a PCN reaction causing severe rash involving mucus membranes or skin necrosis:  N/a Has patient had a PCN reaction that required hospitalization: n/a Has patient had a PCN reaction occurring within the last 10 years: n/a If all of the above answers are "NO", then may proceed with Cephalosporin use.     Medications: I have reviewed the patient's current medications.  Results for orders placed or performed during the hospital encounter of 09/10/16 (from the past 48 hour(s))  Influenza panel by PCR (type A & B)     Status: None   Collection Time: 09/10/16 12:35 AM  Result Value Ref Range   Influenza A By PCR NEGATIVE NEGATIVE   Influenza B By PCR NEGATIVE NEGATIVE    Comment: (NOTE) The Xpert  Xpress Flu assay is intended as an aid in the diagnosis of  influenza and should not be used as a sole basis for treatment.  This  assay is FDA approved for nasopharyngeal swab specimens only. Nasal  washings and aspirates are unacceptable for Xpert Xpress Flu testing.   CBC with Differential/Platelet     Status: Abnormal   Collection Time: 09/10/16  1:25 PM  Result Value Ref Range   WBC 12.2 (H) 4.0 - 10.5 K/uL   RBC 4.05 (L) 4.22 - 5.81 MIL/uL   Hemoglobin 13.3 13.0 - 17.0 g/dL    HCT 39.5 39.0 - 52.0 %   MCV 97.5 78.0 - 100.0 fL   MCH 32.8 26.0 - 34.0 pg   MCHC 33.7 30.0 - 36.0 g/dL   RDW 13.7 11.5 - 15.5 %   Platelets 171 150 - 400 K/uL   Neutrophils Relative % 71 %   Lymphocytes Relative 17 %   Monocytes Relative 12 %   Eosinophils Relative 0 %   Basophils Relative 0 %   Neutro Abs 8.6 (H) 1.7 - 7.7 K/uL   Lymphs Abs 2.1 0.7 - 4.0 K/uL   Monocytes Absolute 1.5 (H) 0.1 - 1.0 K/uL   Eosinophils Absolute 0.0 0.0 - 0.7 K/uL   Basophils Absolute 0.0 0.0 - 0.1 K/uL   Smear Review MORPHOLOGY UNREMARKABLE   Comprehensive metabolic panel     Status: Abnormal   Collection Time: 09/10/16  1:25 PM  Result Value Ref Range   Sodium 137 135 - 145 mmol/L   Potassium 4.0 3.5 - 5.1 mmol/L   Chloride 102 101 - 111 mmol/L   CO2 26 22 - 32 mmol/L   Glucose, Bld 92 65 - 99 mg/dL   BUN 25 (H) 6 - 20 mg/dL   Creatinine, Ser 1.34 (H) 0.61 - 1.24 mg/dL   Calcium 8.9 8.9 - 10.3 mg/dL   Total Protein 6.1 (L) 6.5 - 8.1 g/dL   Albumin 3.6 3.5 - 5.0 g/dL   AST 22 15 - 41 U/L   ALT 14 (L) 17 - 63 U/L   Alkaline Phosphatase 59 38 - 126 U/L   Total Bilirubin 0.8 0.3 - 1.2 mg/dL   GFR calc non Af Amer 46 (L) >60 mL/min   GFR calc Af Amer 53 (L) >60 mL/min    Comment: (NOTE) The eGFR has been calculated using the CKD EPI equation. This calculation has not been validated in all clinical situations. eGFR's persistently <60 mL/min signify possible Chronic Kidney Disease.    Anion gap 9 5 - 15  I-stat troponin, ED     Status: None   Collection Time: 09/10/16  1:45 PM  Result Value Ref Range   Troponin i, poc 0.01 0.00 - 0.08 ng/mL   Comment 3            Comment: Due to the release kinetics of cTnI, a negative result within the first hours of the onset of symptoms does not rule out myocardial infarction with certainty. If myocardial infarction is still suspected, repeat the test at appropriate intervals.   I-stat troponin, ED     Status: None   Collection Time: 09/10/16   1:45 PM  Result Value Ref Range   Troponin i, poc 0.01 0.00 - 0.08 ng/mL   Comment 3            Comment: Due to the release kinetics of cTnI, a negative result within the first hours of the onset of symptoms does  not rule out myocardial infarction with certainty. If myocardial infarction is still suspected, repeat the test at appropriate intervals.   Troponin I     Status: Abnormal   Collection Time: 09/10/16  3:54 PM  Result Value Ref Range   Troponin I 0.03 (HH) <0.03 ng/mL    Comment: CRITICAL RESULT CALLED TO, READ BACK BY AND VERIFIED WITH: A.BABB,RN 09/10/16 @ 1631 BY N.LIVINGSTON   Urinalysis, Routine w reflex microscopic     Status: Abnormal   Collection Time: 09/10/16  5:07 PM  Result Value Ref Range   Color, Urine YELLOW YELLOW   APPearance HAZY (A) CLEAR   Specific Gravity, Urine 1.018 1.005 - 1.030   pH 5.0 5.0 - 8.0   Glucose, UA NEGATIVE NEGATIVE mg/dL   Hgb urine dipstick NEGATIVE NEGATIVE   Bilirubin Urine NEGATIVE NEGATIVE   Ketones, ur NEGATIVE NEGATIVE mg/dL   Protein, ur 30 (A) NEGATIVE mg/dL   Nitrite NEGATIVE NEGATIVE   Leukocytes, UA MODERATE (A) NEGATIVE   RBC / HPF 6-30 0 - 5 RBC/hpf   WBC, UA TOO NUMEROUS TO COUNT 0 - 5 WBC/hpf   Bacteria, UA MANY (A) NONE SEEN   Squamous Epithelial / LPF NONE SEEN NONE SEEN   WBC Clumps PRESENT    Mucous PRESENT    Hyaline Casts, UA PRESENT   CBC with Differential     Status: Abnormal   Collection Time: 09/10/16  6:54 PM  Result Value Ref Range   WBC 13.4 (H) 4.0 - 10.5 K/uL   RBC 4.41 4.22 - 5.81 MIL/uL   Hemoglobin 14.6 13.0 - 17.0 g/dL   HCT 42.9 39.0 - 52.0 %   MCV 97.3 78.0 - 100.0 fL   MCH 33.1 26.0 - 34.0 pg   MCHC 34.0 30.0 - 36.0 g/dL   RDW 13.6 11.5 - 15.5 %   Platelets 178 150 - 400 K/uL   Neutrophils Relative % 70 %   Lymphocytes Relative 17 %   Monocytes Relative 13 %   Eosinophils Relative 0 %   Basophils Relative 0 %   Neutro Abs 9.4 (H) 1.7 - 7.7 K/uL   Lymphs Abs 2.3 0.7 - 4.0 K/uL    Monocytes Absolute 1.7 (H) 0.1 - 1.0 K/uL   Eosinophils Absolute 0.0 0.0 - 0.7 K/uL   Basophils Absolute 0.0 0.0 - 0.1 K/uL   RBC Morphology BURR CELLS     Comment: TARGET CELLS   WBC Morphology MILD LEFT SHIFT (1-5% METAS, OCC MYELO, OCC BANDS)   Comprehensive metabolic panel     Status: Abnormal   Collection Time: 09/10/16  6:54 PM  Result Value Ref Range   Sodium 137 135 - 145 mmol/L   Potassium 4.2 3.5 - 5.1 mmol/L   Chloride 98 (L) 101 - 111 mmol/L   CO2 28 22 - 32 mmol/L   Glucose, Bld 119 (H) 65 - 99 mg/dL   BUN 26 (H) 6 - 20 mg/dL   Creatinine, Ser 1.20 0.61 - 1.24 mg/dL   Calcium 9.3 8.9 - 10.3 mg/dL   Total Protein 7.2 6.5 - 8.1 g/dL   Albumin 4.1 3.5 - 5.0 g/dL   AST 24 15 - 41 U/L   ALT 16 (L) 17 - 63 U/L   Alkaline Phosphatase 69 38 - 126 U/L   Total Bilirubin 0.7 0.3 - 1.2 mg/dL   GFR calc non Af Amer 52 (L) >60 mL/min   GFR calc Af Amer >60 >60 mL/min    Comment: (NOTE)  The eGFR has been calculated using the CKD EPI equation. This calculation has not been validated in all clinical situations. eGFR's persistently <60 mL/min signify possible Chronic Kidney Disease.    Anion gap 11 5 - 15  Glucose, capillary     Status: None   Collection Time: 09/10/16 11:56 PM  Result Value Ref Range   Glucose-Capillary 89 65 - 99 mg/dL  Lactic acid, plasma     Status: None   Collection Time: 09/11/16 12:28 AM  Result Value Ref Range   Lactic Acid, Venous 1.4 0.5 - 1.9 mmol/L  Basic metabolic panel     Status: Abnormal   Collection Time: 09/11/16 12:30 AM  Result Value Ref Range   Sodium 136 135 - 145 mmol/L   Potassium 3.9 3.5 - 5.1 mmol/L   Chloride 102 101 - 111 mmol/L   CO2 22 22 - 32 mmol/L   Glucose, Bld 97 65 - 99 mg/dL   BUN 26 (H) 6 - 20 mg/dL   Creatinine, Ser 1.23 0.61 - 1.24 mg/dL   Calcium 8.9 8.9 - 10.3 mg/dL   GFR calc non Af Amer 51 (L) >60 mL/min   GFR calc Af Amer 59 (L) >60 mL/min    Comment: (NOTE) The eGFR has been calculated using the CKD EPI  equation. This calculation has not been validated in all clinical situations. eGFR's persistently <60 mL/min signify possible Chronic Kidney Disease.    Anion gap 12 5 - 15  CBC     Status: Abnormal   Collection Time: 09/11/16 12:30 AM  Result Value Ref Range   WBC 12.2 (H) 4.0 - 10.5 K/uL   RBC 4.54 4.22 - 5.81 MIL/uL   Hemoglobin 15.0 13.0 - 17.0 g/dL   HCT 43.7 39.0 - 52.0 %   MCV 96.3 78.0 - 100.0 fL   MCH 33.0 26.0 - 34.0 pg   MCHC 34.3 30.0 - 36.0 g/dL   RDW 13.6 11.5 - 15.5 %   Platelets 158 150 - 400 K/uL  MRSA PCR Screening     Status: None   Collection Time: 09/11/16 10:24 AM  Result Value Ref Range   MRSA by PCR NEGATIVE NEGATIVE    Comment:        The GeneXpert MRSA Assay (FDA approved for NASAL specimens only), is one component of a comprehensive MRSA colonization surveillance program. It is not intended to diagnose MRSA infection nor to guide or monitor treatment for MRSA infections.     Dg Chest 2 View  Result Date: 09/10/2016 CLINICAL DATA:  Syncope. EXAM: CHEST  2 VIEW COMPARISON:  08/27/2016 . FINDINGS: Mediastinum and hilar structures are normal. COPD. No focal infiltrate. Calcified pulmonary nodules noted bilaterally consistent with granulomas. Left base pleuroparenchymal thickening again noted consistent with scarring. No pleural effusion or pneumothorax. Heart size normal. Old right rib fractures. IMPRESSION: No acute cardiopulmonary disease. COPD. Left base pleuroparenchymal thickening consistent scarring noted . Electronically Signed   By: Marcello Moores  Register   On: 09/10/2016 14:35   Ct Head Wo Contrast  Result Date: 09/10/2016 CLINICAL DATA:  83 year old male with altered mental status, possible syncope episode. Initial encounter. EXAM: CT HEAD WITHOUT CONTRAST TECHNIQUE: Contiguous axial images were obtained from the base of the skull through the vertex without intravenous contrast. COMPARISON:  Brain MRI 10/07/2016 and earlier. FINDINGS: Brain: Stable  gray-white matter differentiation throughout the brain. No midline shift, ventriculomegaly, mass effect, evidence of mass lesion, intracranial hemorrhage or evidence of cortically based acute infarction. Incidental dural  calcification. Vascular: Calcified atherosclerosis at the skull base. No suspicious intracranial vascular hyperdensity. Skull: Stable visualized osseous structures. Sinuses/Orbits: Previous mastoidectomies. Visualized paranasal sinuses and mastoids are stable and well pneumatized. Other: No acute orbit or scalp soft tissue finding. IMPRESSION: No acute intracranial abnormality. Stable non contrast CT appearance of the brain. Electronically Signed   By: Genevie Ann M.D.   On: 09/10/2016 14:19    ROS  No headache or dizziness  Blood pressure 121/75, pulse 80, temperature 99.5 F (37.5 C), temperature source Oral, resp. rate (!) 23, height 5' 10"  (1.778 m), weight 62.2 kg (137 lb 1.6 oz), SpO2 97 %. Physical Exam No distress  NEURO MS - alert, oriented CN - no masked facies Motor - no weakness but has mild cogwheel rigidity R > L Sensory - no extinction DTR - symmetric Coor - has slight action tremors but limited in both arms by chronic rotator cuff issue Station/gait - not tested  Assessment/Plan: 1. Possible seizure It is unclear if the patient has had multiple seizures though it is concerning given the shaking followed by confusion.  His MRI brain is negative.  He is awaiting the EEG results.  Would hold off on anti-seizure meds for now.  He should also be checked for orthostatics.  2. Tremors Patient does have some Parkinsonian features such as mild cogwheel rigridity.  However, there was no resting tremor.  Its unclear if he has bradykinesia.  Would hold off on anti-PD meds for now.  He should follow-up with neurology as an outpatient for further evaluation.  Dr. Lawana Pai [facility] [phone]  09/11/2016, 3:53 PM

## 2016-09-11 NOTE — Procedures (Signed)
ELECTROENCEPHALOGRAM REPORT  Date of Study: 09/11/2016  Patient's Name: Craig [name] MRN: [id number] Date of Birth: [birth year]  Referring Provider: Dr. Debbe Odea  Clinical History: This is an 83 year old man with an episode where he was difficult to arouse.   Medications: acetaminophen (TYLENOL) tablet 650 mg  atorvastatin (LIPITOR) tablet 10 mg  B-complex with vitamin C tablet 1 tablet  bicalutamide (CASODEX) tablet 50 mg  cephALEXin (KEFLEX) capsule 500 mg  clopidogrel (PLAVIX) tablet 75 mg  enoxaparin (LOVENOX) injection 40 mg  feeding supplement (ENSURE ENLIVE) (ENSURE ENLIVE) liquid 237 mL  LORazepam (ATIVAN) tablet 0.5 mg  tamsulosin (FLOMAX) capsule 0.4 mg   Technical Summary: A multichannel digital EEG recording measured by the international 10-20 system with electrodes applied with paste and impedances below 5000 ohms performed as portable with EKG monitoring in a predominantly drowsy and asleep patient.  Hyperventilation and photic stimulation were not performed.  The digital EEG was referentially recorded, reformatted, and digitally filtered in a variety of bipolar and referential montages for optimal display.   Description: The patient is predominantly drowsy and asleep during the recording.  During brief period of maximal wakefulness, there is a symmetric, medium voltage 8.5 Hz posterior dominant rhythm that attenuates with eye opening. This is admixed with a small amount of diffuse 4-5 Hz theta and 2-3 Hz delta slowing of the waking background.  During drowsiness and sleep, there is an increase in theta and delta slowing of the background with poorly formed vertex waves seen.  Hyperventilation and photic stimulation were not performed.  There were no epileptiform discharges or electrographic seizures seen.    EKG lead showed irregular rhythm.  Impression: This predominantly drowsy and asleep EEG is abnormal due to mild diffuse slowing of the waking  background.  Clinical Correlation of the above findings indicates diffuse cerebral dysfunction that is non-specific in etiology and can be seen with hypoxic/ischemic injury, toxic/metabolic encephalopathies, medication effect, or due to excessive drowsiness.  The absence of epileptiform discharges does not rule out a clinical diagnosis of epilepsy.  Clinical correlation is advised.   Ellouise Newer, M.D.

## 2016-09-11 NOTE — NC FL2 (Signed)
[place] LEVEL OF CARE SCREENING TOOL     IDENTIFICATION  Patient Name: Craig [name] Birthdate: 15-Jun-1926 Sex: male Admission Date (Current Location): 09/10/2016  [facility] and [place] Number:  Herbalist and Address:  [facility] [place], [place] [address], [place], [place] [address]      Provider Number: [id number]  Attending Physician Name and Address:  Debbe Odea, MD  Relative Name and Phone Number:       Current Level of Care: Hospital Recommended Level of Care: [place] Prior Approval Number:    Date Approved/Denied:   PASRR Number: [id number]  Discharge Plan: SNF    Current Diagnoses: Patient Active Problem List   Diagnosis Date Noted  . Unresponsive episode 09/10/2016  . Tremor 09/10/2016  . Ejection fraction   . HCAP (healthcare-associated pneumonia) 11/27/2013  . Hypoxia 11/25/2013  . Carbon monoxide exposure 11/25/2013  . Carboxyhemoglobinemia 11/25/2013  . NSTEMI (non-ST elevated myocardial infarction) ([place]) 11/21/2013  . CAP (community acquired pneumonia) 11/21/2013  . Hyperlipidemia 11/21/2013  . Lumbosacral spondylosis without myelopathy 09/29/2013  . Hip arthritis 09/29/2013  . GERD (gastroesophageal reflux disease) 07/13/2013  . Genital herpes 07/10/2013  . Essential hypertension 07/10/2013  . Insomnia 07/09/2013  . TIA (transient ischemic attack) 06/24/2013  . Postoperative anemia due to acute blood loss 06/22/2013  . Hyponatremia 06/22/2013  . OA (osteoarthritis) of knee 06/21/2013  . Prostate cancer ([employer]) 04/14/2012    Orientation RESPIRATION BLADDER Height & Weight     Self, Time, Situation, Place  O2 (2L [place]) Incontinent Weight: 137 lb 1.6 oz (62.2 kg) Height:  5\' 10"  (177.8 cm)  BEHAVIORAL SYMPTOMS/MOOD NEUROLOGICAL BOWEL NUTRITION STATUS      Continent Diet (see DC summary)  AMBULATORY STATUS COMMUNICATION OF NEEDS Skin   Extensive Assist Verbally Normal                    Personal Care Assistance Level of Assistance  Bathing, Dressing Bathing Assistance: Maximum assistance   Dressing Assistance: Maximum assistance     Functional Limitations Info             SPECIAL CARE FACTORS FREQUENCY  PT (By licensed PT), OT (By licensed OT)     PT Frequency: 5/wk OT Frequency: 5/wk            Contractures      Additional Factors Info  Code Status, Allergies, Isolation Precautions Code Status Info: DNR Allergies Info: penicillins     Isolation Precautions Info: droplet     Current Medications (09/11/2016):  This is the current hospital active medication list Current Facility-Administered Medications  Medication Dose Route Frequency Provider Last Rate Last Dose  . 0.9 %  sodium chloride infusion   Intravenous Continuous Debbe Odea, MD 75 mL/hr at 09/11/16 0400    . 0.9 %  sodium chloride infusion  250 mL Intravenous PRN Debbe Odea, MD      . acetaminophen (TYLENOL) tablet 650 mg  650 mg Oral Q6H PRN Debbe Odea, MD   650 mg at 09/11/16 0229   Or  . acetaminophen (TYLENOL) suppository 650 mg  650 mg Rectal Q6H PRN Debbe Odea, MD      . acetaminophen (TYLENOL) tablet 325 mg  325 mg Oral Once Gardiner Barefoot, NP      . atorvastatin (LIPITOR) tablet 10 mg  10 mg Oral Daily Debbe Odea, MD   10 mg at 09/11/16 0827  . B-complex with vitamin C  tablet 1 tablet  1 tablet Oral Daily Debbe Odea, MD   1 tablet at 09/11/16 0827  . bicalutamide (CASODEX) tablet 50 mg  50 mg Oral Daily Debbe Odea, MD   50 mg at 09/11/16 0827  . cephALEXin (KEFLEX) capsule 500 mg  500 mg Oral Q12H Debbe Odea, MD   500 mg at 09/11/16 0827  . clopidogrel (PLAVIX) tablet 75 mg  75 mg Oral Daily Debbe Odea, MD   75 mg at 09/11/16 0827  . enoxaparin (LOVENOX) injection 40 mg  40 mg Subcutaneous Q24H Debbe Odea, MD   40 mg at 09/11/16 0828  . feeding supplement (ENSURE ENLIVE) (ENSURE ENLIVE) liquid 237 mL  237 mL Oral TID BM Debbe Odea, MD   237 mL at  09/11/16 1000  . LORazepam (ATIVAN) tablet 0.5 mg  0.5 mg Oral QHS PRN Debbe Odea, MD      . ondansetron (ZOFRAN) tablet 4 mg  4 mg Oral Q6H PRN Debbe Odea, MD       Or  . ondansetron (ZOFRAN) injection 4 mg  4 mg Intravenous Q6H PRN Saima Rizwan, MD      . sodium chloride flush (NS) 0.9 % injection 3 mL  3 mL Intravenous Q12H Debbe Odea, MD   3 mL at 09/11/16 0828  . sodium chloride flush (NS) 0.9 % injection 3 mL  3 mL Intravenous PRN Debbe Odea, MD      . tamsulosin (FLOMAX) capsule 0.4 mg  0.4 mg Oral QPC supper Debbe Odea, MD   0.4 mg at 09/10/16 2031     Discharge Medications: Please see discharge summary for a list of discharge medications.  Relevant Imaging Results:  Relevant Lab Results:   Additional Information SS#: [id number]  Jorge Ny, LCSW

## 2016-09-11 NOTE — Progress Notes (Signed)
Notified Rizwan Temp of 101.8 taken rectally. Verbal order for IV Rocephin and to stop Keflex. I will continue to monitor the client closely.   Saddie Benders RN

## 2016-09-11 NOTE — Progress Notes (Signed)
Notified Rizwan with bladder scan results after voiding 24ml. Scan showed 231ml. No new orders placed at this time. I will continue to monitor the client closely.  Saddie Benders RN

## 2016-09-11 NOTE — Care Management Obs Status (Signed)
[place] NOTIFICATION   Patient Details  Name: KONSTANTINE VOGELPOHL MRN: [id number] Date of Birth: [birth year]   Medicare Observation Status Notification Given:  Yes    Bethena Roys, RN 09/11/2016, 11:28 AM

## 2016-09-12 LAB — BLOOD CULTURE ID PANEL (REFLEXED)
Acinetobacter baumannii: NOT DETECTED
CANDIDA ALBICANS: NOT DETECTED
CANDIDA GLABRATA: NOT DETECTED
CANDIDA KRUSEI: NOT DETECTED
CANDIDA PARAPSILOSIS: NOT DETECTED
CANDIDA TROPICALIS: NOT DETECTED
ENTEROBACTERIACEAE SPECIES: NOT DETECTED
ESCHERICHIA COLI: NOT DETECTED
Enterobacter cloacae complex: NOT DETECTED
Enterococcus species: NOT DETECTED
HAEMOPHILUS INFLUENZAE: NOT DETECTED
KLEBSIELLA OXYTOCA: NOT DETECTED
KLEBSIELLA PNEUMONIAE: NOT DETECTED
Listeria monocytogenes: NOT DETECTED
METHICILLIN RESISTANCE: NOT DETECTED
Neisseria meningitidis: NOT DETECTED
PSEUDOMONAS AERUGINOSA: NOT DETECTED
Proteus species: NOT DETECTED
STREPTOCOCCUS PNEUMONIAE: NOT DETECTED
STREPTOCOCCUS PYOGENES: NOT DETECTED
Serratia marcescens: NOT DETECTED
Staphylococcus aureus (BCID): NOT DETECTED
Staphylococcus species: DETECTED — AB
Streptococcus agalactiae: NOT DETECTED
Streptococcus species: NOT DETECTED

## 2016-09-12 MED ORDER — CEFAZOLIN SODIUM-DEXTROSE 2-4 GM/100ML-% IV SOLN
2.0000 g | Freq: Three times a day (TID) | INTRAVENOUS | Status: DC
  Administered 2016-09-12 – 2016-09-14 (×7): 2 g via INTRAVENOUS
  Filled 2016-09-12 (×8): qty 100

## 2016-09-12 NOTE — Progress Notes (Signed)
PROGRESS NOTE    Craig [name]   [id number]  DOB: [birth year]  DOA: 09/10/2016 PCP: Myriam Jacobson, MD   Brief Narrative:  Craig [name] is a 83 y.o. male with medical history significant of chronic back pain with degenerative disc disease and compression fractures, TIA who presents for an episode today where he was difficult to arouse. According to the son and daughter, the patient was sleeping but when his aide tried to wake him up, he would not awaken. He began to gurgle and subsequently vomited while unconscious. His aide began to sit him up and shake him and eventually he was able to regain consciousness. He states that during the time where he was not responding, he was able to hear some things that were happening but could not talk or move. Family goes on to explain that he has had 2 episodes where he had sudden shaking followed by severe weakness. During these episodes the patient was not able to speak. After one such episode, on 1/16, he presented to the ER and underwent a CT scan and MRI of the brain which were negative. He is currently receiving rehabilitation at a skilled nursing facility. His son and daughter have noted that he has developed a tremor over the past 3 weeks which is mostly present in his extremities when he moves them. His daughter also felt that he may have a UTI and his urine was sent to his PCP for UA and culture.  Subjective: No new complaints.   Assessment & Plan:  Unresponsive episode -With vomiting during the episode- ? Seizure - head CT unrevealing - 2 spells of "shaking" followed by severe fatigue prior to admission- he had an episode of rigors while in the hospital last night due to a fever-  -The ER spoke with neurology and the neurologist on call has recommended admission for an EEG which I have ordered -Continue to observe for further spells  Active Problems: Leukocytosis/ UTI  -  Started Keflex but continued to have fevers-  switched to Rocephin - 1 set of blood culture + for coag neg staph- likely contaminant  - f/u U culture- post void residual is low  Essential hypertension -Hold lisinopril due to mild creatinine increase  History of TIAs -Continue Plavix and STatin  AKI - baseline Cr 0.0- currently is 1.34 with BUN elevated to 25 -Hold lisinopril-slow normal saline infusion ordered- appears much better hydrated now- will stop  IVF  Tremor  ? Senile tremor ? Due to deconditioning - significantly improved with treatment of UTI   History of prostate cancer -Continue Casodex - also on Lupron monthly  DVT prophylaxis:  Lovenox Code Status: Full code Family Communication:  With daughter and son Disposition Plan: SNF Consultants:    Procedures:    Antimicrobials:  Anti-infectives    Start     Dose/Rate Route Frequency Ordered Stop   09/12/16 0600  ceFAZolin (ANCEF) IVPB 2g/100 mL premix     2 g 200 mL/hr over 30 Minutes Intravenous Every 8 hours 09/12/16 0339     09/11/16 1800  cefTRIAXone (ROCEPHIN) 1 g in dextrose 5 % 50 mL IVPB  Status:  Discontinued     1 g 100 mL/hr over 30 Minutes Intravenous Every 24 hours 09/11/16 1707 09/12/16 0339   09/10/16 1830  cephALEXin (KEFLEX) capsule 500 mg  Status:  Discontinued     500 mg Oral Every 12 hours 09/10/16 1751 09/11/16 1708       Objective: Vitals:  09/11/16 0315 09/11/16 1600 09/11/16 2018 09/12/16 0605  BP: 121/75  105/86 (!) 148/82  Pulse:   74 92  Resp:   18 18  Temp: 99.5 F (37.5 C) (!) 101.8 F (38.8 C) 98.3 F (36.8 C) 98.3 F (36.8 C)  TempSrc: Oral Rectal Oral Oral  SpO2: 97%  95% 95%  Weight: 62.2 kg (137 lb 1.6 oz)   65.7 kg (144 lb 12.8 oz)  Height: 5\' 10"  (1.778 m)   5\' 10"  (1.778 m)    Intake/Output Summary (Last 24 hours) at 09/12/16 1104 Last data filed at 09/12/16 0900  Gross per 24 hour  Intake          1251.25 ml  Output              925 ml  Net           326.25 ml   Filed Weights   09/10/16  1827 09/11/16 0315 09/12/16 0605  Weight: 63.7 kg (140 lb 8 oz) 62.2 kg (137 lb 1.6 oz) 65.7 kg (144 lb 12.8 oz)    Examination: General exam: Appears comfortable  HEENT: PERRLA, oral mucosa moist, no sclera icterus or thrush Respiratory system: Clear to auscultation. Respiratory effort normal. Cardiovascular system: S1 & S2 heard, RRR.  No murmurs  Gastrointestinal system: Abdomen soft, non-tender, nondistended. Normal bowel sound. No organomegaly Central nervous system: Alert and oriented. No focal neurological deficits. Extremities: No cyanosis, clubbing or edema Skin: No rashes or ulcers Psychiatry:  Mood & affect appropriate.     Data Reviewed: I have personally reviewed following labs and imaging studies  CBC:  Recent Labs Lab 09/10/16 1325 09/10/16 1854 09/11/16 0030  WBC 12.2* 13.4* 12.2*  NEUTROABS 8.6* 9.4*  --   HGB 13.3 14.6 15.0  HCT 39.5 42.9 43.7  MCV 97.5 97.3 96.3  PLT 171 178 0000000   Basic Metabolic Panel:  Recent Labs Lab 09/10/16 1325 09/10/16 1854 09/11/16 0030  NA 137 137 136  K 4.0 4.2 3.9  CL 102 98* 102  CO2 26 28 22   GLUCOSE 92 119* 97  BUN 25* 26* 26*  CREATININE 1.34* 1.20 1.23  CALCIUM 8.9 9.3 8.9   GFR: Estimated Creatinine Clearance: 38.6 mL/min (by C-G formula based on SCr of 1.23 mg/dL). Liver Function Tests:  Recent Labs Lab 09/10/16 1325 09/10/16 1854  AST 22 24  ALT 14* 16*  ALKPHOS 59 69  BILITOT 0.8 0.7  PROT 6.1* 7.2  ALBUMIN 3.6 4.1   No results for input(s): LIPASE, AMYLASE in the last 168 hours. No results for input(s): AMMONIA in the last 168 hours. Coagulation Profile: No results for input(s): INR, PROTIME in the last 168 hours. Cardiac Enzymes:  Recent Labs Lab 09/10/16 1554  TROPONINI 0.03*   BNP (last 3 results) No results for input(s): PROBNP in the last 8760 hours. HbA1C: No results for input(s): HGBA1C in the last 72 hours. CBG:  Recent Labs Lab 09/10/16 2356  GLUCAP 89   Lipid  Profile: No results for input(s): CHOL, HDL, LDLCALC, TRIG, CHOLHDL, LDLDIRECT in the last 72 hours. Thyroid Function Tests: No results for input(s): TSH, T4TOTAL, FREET4, T3FREE, THYROIDAB in the last 72 hours. Anemia Panel: No results for input(s): VITAMINB12, FOLATE, FERRITIN, TIBC, IRON, RETICCTPCT in the last 72 hours. Urine analysis:    Component Value Date/Time   COLORURINE YELLOW 09/10/2016 1707   APPEARANCEUR HAZY (A) 09/10/2016 1707   LABSPEC 1.018 09/10/2016 1707   PHURINE 5.0 09/10/2016 1707  GLUCOSEU NEGATIVE 09/10/2016 [place] 09/10/2016 1707   [place] 09/10/2016 [place] 09/10/2016 1707   PROTEINUR 30 (A) 09/10/2016 1707   UROBILINOGEN 0.2 12/18/2014 2111   NITRITE NEGATIVE 09/10/2016 1707   LEUKOCYTESUR MODERATE (A) 09/10/2016 1707   Sepsis Labs: @LABRCNTIP (procalcitonin:4,lacticidven:4) ) Recent Results (from the past 240 hour(s))  Culture, blood (routine x 2)     Status: None (Preliminary result)   Collection Time: 09/11/16 12:24 AM  Result Value Ref Range Status   Specimen Description BLOOD RIGHT ANTECUBITAL  Final   Special Requests   Final    BOTTLES DRAWN AEROBIC AND ANAEROBIC  10CC AER 6CC ANA   Culture  Setup Time   Final    ANAEROBIC BOTTLE ONLY GRAM POSITIVE COCCI IN CLUSTERS Organism ID to follow CRITICAL RESULT CALLED TO, READ BACK BY AND VERIFIED WITH: TO JLEDFORD(PHARMD) BY TCLEVELAND 09/12/16 AT 3:28AM    Culture PENDING  Incomplete   Report Status PENDING  Incomplete  Blood Culture ID Panel (Reflexed)     Status: Abnormal   Collection Time: 09/11/16 12:24 AM  Result Value Ref Range Status   Enterococcus species NOT DETECTED NOT DETECTED Final   Listeria monocytogenes NOT DETECTED NOT DETECTED Final   Staphylococcus species DETECTED (A) NOT DETECTED Final    Comment: CRITICAL RESULT CALLED TO, READ BACK BY AND VERIFIED WITH: TO JLEDFORD(PHARMD) BY TCLEVELAND 09/12/2016 AT 3:28AM    Staphylococcus  aureus NOT DETECTED NOT DETECTED Final   Methicillin resistance NOT DETECTED NOT DETECTED Final   Streptococcus species NOT DETECTED NOT DETECTED Final   Streptococcus agalactiae NOT DETECTED NOT DETECTED Final   Streptococcus pneumoniae NOT DETECTED NOT DETECTED Final   Streptococcus pyogenes NOT DETECTED NOT DETECTED Final   Acinetobacter baumannii NOT DETECTED NOT DETECTED Final   Enterobacteriaceae species NOT DETECTED NOT DETECTED Final   Enterobacter cloacae complex NOT DETECTED NOT DETECTED Final   Escherichia coli NOT DETECTED NOT DETECTED Final   Klebsiella oxytoca NOT DETECTED NOT DETECTED Final   Klebsiella pneumoniae NOT DETECTED NOT DETECTED Final   Proteus species NOT DETECTED NOT DETECTED Final   Serratia marcescens NOT DETECTED NOT DETECTED Final   Haemophilus influenzae NOT DETECTED NOT DETECTED Final   Neisseria meningitidis NOT DETECTED NOT DETECTED Final   Pseudomonas aeruginosa NOT DETECTED NOT DETECTED Final   Candida albicans NOT DETECTED NOT DETECTED Final   Candida glabrata NOT DETECTED NOT DETECTED Final   Candida krusei NOT DETECTED NOT DETECTED Final   Candida parapsilosis NOT DETECTED NOT DETECTED Final   Candida tropicalis NOT DETECTED NOT DETECTED Final  MRSA PCR Screening     Status: None   Collection Time: 09/11/16 10:24 AM  Result Value Ref Range Status   MRSA by PCR NEGATIVE NEGATIVE Final    Comment:        The GeneXpert MRSA Assay (FDA approved for NASAL specimens only), is one component of a comprehensive MRSA colonization surveillance program. It is not intended to diagnose MRSA infection nor to guide or monitor treatment for MRSA infections.          Radiology Studies: Dg Chest 2 View  Result Date: 09/10/2016 CLINICAL DATA:  Syncope. EXAM: CHEST  2 VIEW COMPARISON:  08/27/2016 . FINDINGS: Mediastinum and hilar structures are normal. COPD. No focal infiltrate. Calcified pulmonary nodules noted bilaterally consistent with granulomas.  Left base pleuroparenchymal thickening again noted consistent with scarring. No pleural effusion or pneumothorax. Heart size normal. Old right rib fractures. IMPRESSION: No  acute cardiopulmonary disease. COPD. Left base pleuroparenchymal thickening consistent scarring noted . Electronically Signed   By: Marcello Moores  Register   On: 09/10/2016 14:35   Ct Head Wo Contrast  Result Date: 09/10/2016 CLINICAL DATA:  83 year old male with altered mental status, possible syncope episode. Initial encounter. EXAM: CT HEAD WITHOUT CONTRAST TECHNIQUE: Contiguous axial images were obtained from the base of the skull through the vertex without intravenous contrast. COMPARISON:  Brain MRI 10/07/2016 and earlier. FINDINGS: Brain: Stable gray-white matter differentiation throughout the brain. No midline shift, ventriculomegaly, mass effect, evidence of mass lesion, intracranial hemorrhage or evidence of cortically based acute infarction. Incidental dural calcification. Vascular: Calcified atherosclerosis at the skull base. No suspicious intracranial vascular hyperdensity. Skull: Stable visualized osseous structures. Sinuses/Orbits: Previous mastoidectomies. Visualized paranasal sinuses and mastoids are stable and well pneumatized. Other: No acute orbit or scalp soft tissue finding. IMPRESSION: No acute intracranial abnormality. Stable non contrast CT appearance of the brain. Electronically Signed   By: Genevie Ann M.D.   On: 09/10/2016 14:19      Scheduled Meds: . acetaminophen  325 mg Oral Once  . atorvastatin  10 mg Oral Daily  . B-complex with vitamin C  1 tablet Oral Daily  . bicalutamide  50 mg Oral Daily  .  ceFAZolin (ANCEF) IV  2 g Intravenous Q8H  . clopidogrel  75 mg Oral Daily  . enoxaparin (LOVENOX) injection  40 mg Subcutaneous Q24H  . feeding supplement (ENSURE ENLIVE)  237 mL Oral TID BM  . sodium chloride flush  3 mL Intravenous Q12H  . tamsulosin  0.4 mg Oral QPC supper   Continuous Infusions: . sodium  chloride 75 mL/hr at 09/11/16 2000     LOS: 1 day    Time spent in minutes: 56    [place], MD [facility] Pager: [email or web address] Password [email or web address] 09/12/2016, 11:04 AM

## 2016-09-12 NOTE — Progress Notes (Signed)
PHARMACY - PHYSICIAN COMMUNICATION CRITICAL VALUE ALERT - BLOOD CULTURE IDENTIFICATION (BCID)  Results for orders placed or performed during the hospital encounter of 09/10/16  Blood Culture ID Panel (Reflexed) (Collected: 09/11/2016 12:24 AM)  Result Value Ref Range   Enterococcus species NOT DETECTED NOT DETECTED   Listeria monocytogenes NOT DETECTED NOT DETECTED   Staphylococcus species DETECTED (A) NOT DETECTED   Staphylococcus aureus NOT DETECTED NOT DETECTED   Methicillin resistance NOT DETECTED NOT DETECTED   Streptococcus species NOT DETECTED NOT DETECTED   Streptococcus agalactiae NOT DETECTED NOT DETECTED   Streptococcus pneumoniae NOT DETECTED NOT DETECTED   Streptococcus pyogenes NOT DETECTED NOT DETECTED   Acinetobacter baumannii NOT DETECTED NOT DETECTED   Enterobacteriaceae species NOT DETECTED NOT DETECTED   Enterobacter cloacae complex NOT DETECTED NOT DETECTED   Escherichia coli NOT DETECTED NOT DETECTED   Klebsiella oxytoca NOT DETECTED NOT DETECTED   Klebsiella pneumoniae NOT DETECTED NOT DETECTED   Proteus species NOT DETECTED NOT DETECTED   Serratia marcescens NOT DETECTED NOT DETECTED   Haemophilus influenzae NOT DETECTED NOT DETECTED   Neisseria meningitidis NOT DETECTED NOT DETECTED   Pseudomonas aeruginosa NOT DETECTED NOT DETECTED   Candida albicans NOT DETECTED NOT DETECTED   Candida glabrata NOT DETECTED NOT DETECTED   Candida krusei NOT DETECTED NOT DETECTED   Candida parapsilosis NOT DETECTED NOT DETECTED   Candida tropicalis NOT DETECTED NOT DETECTED    Name of physician (or Provider) Contacted: Baltazar Najjar ([facility])  Changes to prescribed antibiotics required: Change ceftriaxone to cefazolin 2g IV q8h  Narda Bonds 09/12/2016  3:46 AM

## 2016-09-13 ENCOUNTER — Inpatient Hospital Stay (HOSPITAL_COMMUNITY): Payer: Medicare Other

## 2016-09-13 DIAGNOSIS — I471 Supraventricular tachycardia: Secondary | ICD-10-CM

## 2016-09-13 DIAGNOSIS — I4891 Unspecified atrial fibrillation: Secondary | ICD-10-CM

## 2016-09-13 LAB — ECHOCARDIOGRAM COMPLETE
CHL CUP TV REG PEAK VELOCITY: 303 cm/s
EWDT: 274 ms
FS: 32 % (ref 28–44)
Height: 70 in
IVS/LV PW RATIO, ED: 0.92
LA ID, A-P, ES: 33 mm
LA vol index: 26.1 mL/m2
LADIAMINDEX: 1.8 cm/m2
LAVOL: 47.7 mL
LAVOLA4C: 35 mL
LDCA: 4.52 cm2
LEFT ATRIUM END SYS DIAM: 33 mm
LV PW d: 12 mm — AB (ref 0.6–1.1)
LV TDI E'LATERAL: 9.68
LV e' LATERAL: 9.68 cm/s
LVOTD: 24 mm
MV Dec: 274
MV pk E vel: 1.1 m/s
RV LATERAL S' VELOCITY: 12.4 cm/s
RV TAPSE: 15.6 mm
TDI e' medial: 7.51
TR max vel: 303 cm/s
Weight: 2336 oz

## 2016-09-13 LAB — T4, FREE: Free T4: 1.15 ng/dL — ABNORMAL HIGH (ref 0.61–1.12)

## 2016-09-13 LAB — TSH: TSH: 1.947 u[IU]/mL (ref 0.350–4.500)

## 2016-09-13 MED ORDER — DICLOFENAC SODIUM 1 % TD GEL
4.0000 g | Freq: Four times a day (QID) | TRANSDERMAL | Status: DC
  Administered 2016-09-13 – 2016-09-14 (×2): 4 g via TOPICAL
  Filled 2016-09-13: qty 100

## 2016-09-13 MED ORDER — METOPROLOL TARTRATE 25 MG PO TABS
25.0000 mg | ORAL_TABLET | Freq: Two times a day (BID) | ORAL | Status: DC
  Administered 2016-09-13 – 2016-09-14 (×3): 25 mg via ORAL
  Filled 2016-09-13 (×3): qty 1

## 2016-09-13 NOTE — Evaluation (Signed)
Physical Therapy Evaluation Patient Details Name: SPIRIDON GAUDREAULT MRN: [id number] DOB: [birth year] Today's Date: 09/13/2016   History of Present Illness  83 y.o. male admitted from [facility] with unresponsive episode, tremors, UTI. PMH: HTN, COPD, hyperlipidemia, chronic back pain, TIA  Clinical Impression  Pt admitted with above diagnosis. Pt currently with functional limitations due to the deficits listed below (see PT Problem List). Max assist for supine to sit and for stand pivot transfers x 3. Pt is significantly deconditioned and needs SNF level of care for post-acute rehabilitation.  Pt will benefit from skilled PT to increase their independence and safety with mobility to allow discharge to the venue listed below.       Follow Up Recommendations SNF;Supervision/Assistance - 24 hour (assist for mobility)    Equipment Recommendations  None recommended by PT    Recommendations for Other Services       Precautions / Restrictions Precautions Precautions: Fall Precaution Comments: 3 falls in past 1 year Restrictions Weight Bearing Restrictions: No      Mobility  Bed Mobility Overal bed mobility: Needs Assistance Bed Mobility: Supine to Sit     Supine to sit: Max assist     General bed mobility comments: max A to raise trunk (pt 40%), assist to advance BLEs  Transfers Overall transfer level: Needs assistance Equipment used: Rolling walker (2 wheeled) Transfers: Sit to/from [facility] Sit to Stand: Max assist         General transfer comment: assist to rise/steady, VCs hand placement, pt 50%, sit to stand x 5 reps, SPT x 3 (bed to 3 in 1 to recliner to bed for ultrasound) with RW, assist for balance, pt took several shuffling, pivotal steps with flexed trunk  Ambulation/Gait             General Gait Details: deferred -pt fatigued from transfers  Stairs            Wheelchair Mobility    Modified Rankin (Stroke Patients  Only)       Balance Overall balance assessment: Needs assistance   Sitting balance-Leahy Scale: Good     Standing balance support: Bilateral upper extremity supported Standing balance-Leahy Scale: Poor Standing balance comment: relies on BUE support                             Pertinent Vitals/Pain Pain Assessment: No/denies pain    Home Living Family/patient expects to be discharged to:: Skilled nursing facility                      Prior Function Level of Independence: Independent with assistive device(s)         Comments: walks to dining room with rollator, independent with ADLs     Hand Dominance        Extremity/Trunk Assessment   Upper Extremity Assessment Upper Extremity Assessment: Overall WFL for tasks assessed    Lower Extremity Assessment Lower Extremity Assessment: Overall WFL for tasks assessed (knee ext +4/5 B)    Cervical / Trunk Assessment Cervical / Trunk Assessment: Kyphotic  Communication   Communication: No difficulties  Cognition Arousal/Alertness: Awake/alert Behavior During Therapy: WFL for tasks assessed/performed Overall Cognitive Status: Within Functional Limits for tasks assessed                      General Comments      Exercises     Assessment/Plan  PT Assessment Patient needs continued PT services  PT Problem List Decreased strength;Decreased activity tolerance;Decreased balance;Decreased mobility          PT Treatment Interventions DME instruction;Gait training;Functional mobility training;Balance training;Therapeutic exercise;Therapeutic activities;Patient/family education    PT Goals (Current goals can be found in the Care Plan section)  Acute Rehab PT Goals Patient Stated Goal: return to ILF PT Goal Formulation: With patient Time For Goal Achievement: 09/27/16 Potential to Achieve Goals: Good    Frequency Min 3X/week   Barriers to discharge        Co-evaluation                End of Session Equipment Utilized During Treatment: Gait belt Activity Tolerance: Patient limited by fatigue Patient left: in bed;with nursing/sitter in room;with call bell/phone within reach Nurse Communication: Mobility status         Time: [id number] PT Time Calculation (min) (ACUTE ONLY): 46 min   Charges:   PT Evaluation $PT Eval Moderate Complexity: 1 Procedure PT Treatments $Therapeutic Activity: 23-37 mins   PT G Codes:        Philomena Doheny 09/13/2016, 10:39 AM [phone]

## 2016-09-13 NOTE — Plan of Care (Signed)
Problem: Education: Goal: Knowledge of [place] [facility] Education information/materials will improve Outcome: Progressing Discussed plan of care with keeping condom cath on, protecting IV site with gauze and anxiety/sleep medication with some teach back displayed.

## 2016-09-13 NOTE — Progress Notes (Signed)
PROGRESS NOTE    Craig [name]   [id number]  DOB: 1926/01/29  DOA: 09/10/2016 PCP: Myriam Jacobson, MD   Brief Narrative:  Craig [name] is a 83 y.o. male with medical history significant of chronic back pain with degenerative disc disease and compression fractures, TIA who presents for an episode today where he was difficult to arouse. According to the son and daughter, the patient was sleeping but when his aide tried to wake him up, he would not awaken. He began to gurgle and subsequently vomited while unconscious. His aide began to sit him up and shake him and eventually he was able to regain consciousness. He states that during the time where he was not responding, he was able to hear some things that were happening but could not talk or move. Family goes on to explain that he has had 2 episodes where he had sudden shaking followed by severe weakness. During these episodes the patient was not able to speak. After one such episode, on 1/16, he presented to the ER and underwent a CT scan and MRI of the brain which were negative. He is currently receiving rehabilitation at a skilled nursing facility. His son and daughter have noted that he has developed a tremor over the past 3 weeks which is mostly present in his extremities when he moves them. His daughter also felt that he may have a UTI and his urine was sent to his PCP for UA and culture.  Subjective: No new complaints. HR noted to become rapid while I was in the room- rhythm noted to be SVT. Patient asymptomatic.   Assessment & Plan:  Unresponsive episode- tremors -With vomiting during the episode- ? Seizure - head CT unrevealing - 2 spells of "shaking" followed by severe fatigue prior to admission- he had an episode of rigors while in the hospital last night due to a fever-  -The ER spoke with neurology and the neurologist on call has recommended admission for an EEG which I have ordered - this is negative -has not had  further spells and tremor has resolved completely - suspect his shaking was actually rigors from UTI- he did have a couple of episodes in the hospital with were related to fever  Active Problems: Leukocytosis/ UTI  -  Started Keflex but continued to have fevers- switched to Rocephin - 1 set of blood culture + for coag neg staph- likely contaminant  - f/u U culture has still not resulted- checked with [facility] since his daughter took a urine sample to them this week- UA was + but was not sent for culture - post void residual is low  Essential hypertension -Holding lisinopril due to mild creatinine increase on admission  PSVT - noted incidentally on tele monitor- add Metoprolol- check ECHO  History of TIAs -Continue Plavix and STatin  AKI - baseline Cr 0.0- currently is 1.34 with BUN elevated to 25 -Hold lisinopril -slow normal saline infusion ordered-Cr improved- appears much better hydrated now-  stopped  IVF  History of prostate cancer -Continue Casodex - also on Lupron monthly  DVT prophylaxis:  Lovenox Code Status: Full code Family Communication:  With daughter and son Disposition Plan: SNF Consultants:    Procedures:    Antimicrobials:  Anti-infectives    Start     Dose/Rate Route Frequency Ordered Stop   09/12/16 0600  ceFAZolin (ANCEF) IVPB 2g/100 mL premix     2 g 200 mL/hr over 30 Minutes Intravenous Every 8 hours 09/12/16  [id number]     09/11/16 1800  cefTRIAXone (ROCEPHIN) 1 g in dextrose 5 % 50 mL IVPB  Status:  Discontinued     1 g 100 mL/hr over 30 Minutes Intravenous Every 24 hours 09/11/16 1707 09/12/16 0339   09/10/16 1830  cephALEXin (KEFLEX) capsule 500 mg  Status:  Discontinued     500 mg Oral Every 12 hours 09/10/16 1751 09/11/16 1708       Objective: Vitals:   09/12/16 1200 09/12/16 1931 09/12/16 2000 09/13/16 0434  BP: 109/66 (!) 157/101  (!) 155/79  Pulse:  84  82  Resp:  (!) 21  20  Temp:  98 F (36.7 C)  98 F (36.7 C)    TempSrc:  Oral  Oral  SpO2: 97% 98% 98% 97%  Weight:    66.2 kg (146 lb)  Height:        Intake/Output Summary (Last 24 hours) at 09/13/16 1055 Last data filed at 09/13/16 0600  Gross per 24 hour  Intake           2382.5 ml  Output             1050 ml  Net           1332.5 ml   Filed Weights   09/11/16 0315 09/12/16 0605 09/13/16 0434  Weight: 62.2 kg (137 lb 1.6 oz) 65.7 kg (144 lb 12.8 oz) 66.2 kg (146 lb)    Examination: General exam: Appears comfortable  HEENT: PERRLA, oral mucosa moist, no sclera icterus or thrush Respiratory system: Clear to auscultation. Respiratory effort normal. Cardiovascular system: S1 & S2 heard, RRR.  No murmurs  Gastrointestinal system: Abdomen soft, non-tender, nondistended. Normal bowel sound. No organomegaly Central nervous system: Alert and oriented. No focal neurological deficits. Extremities: No cyanosis, clubbing or edema Skin: No rashes or ulcers Psychiatry:  Mood & affect appropriate.     Data Reviewed: I have personally reviewed following labs and imaging studies  CBC:  Recent Labs Lab 09/10/16 1325 09/10/16 1854 09/11/16 0030  WBC 12.2* 13.4* 12.2*  NEUTROABS 8.6* 9.4*  --   HGB 13.3 14.6 15.0  HCT 39.5 42.9 43.7  MCV 97.5 97.3 96.3  PLT 171 178 0000000   Basic Metabolic Panel:  Recent Labs Lab 09/10/16 1325 09/10/16 1854 09/11/16 0030  NA 137 137 136  K 4.0 4.2 3.9  CL 102 98* 102  CO2 26 28 22   GLUCOSE 92 119* 97  BUN 25* 26* 26*  CREATININE 1.34* 1.20 1.23  CALCIUM 8.9 9.3 8.9   GFR: Estimated Creatinine Clearance: 38.9 mL/min (by C-G formula based on SCr of 1.23 mg/dL). Liver Function Tests:  Recent Labs Lab 09/10/16 1325 09/10/16 1854  AST 22 24  ALT 14* 16*  ALKPHOS 59 69  BILITOT 0.8 0.7  PROT 6.1* 7.2  ALBUMIN 3.6 4.1   No results for input(s): LIPASE, AMYLASE in the last 168 hours. No results for input(s): AMMONIA in the last 168 hours. Coagulation Profile: No results for input(s): INR,  PROTIME in the last 168 hours. Cardiac Enzymes:  Recent Labs Lab 09/10/16 1554  TROPONINI 0.03*   BNP (last 3 results) No results for input(s): PROBNP in the last 8760 hours. HbA1C: No results for input(s): HGBA1C in the last 72 hours. CBG:  Recent Labs Lab 09/10/16 2356  GLUCAP 89   Lipid Profile: No results for input(s): CHOL, HDL, LDLCALC, TRIG, CHOLHDL, LDLDIRECT in the last 72 hours. Thyroid Function Tests: No results for input(s): TSH,  T4TOTAL, FREET4, T3FREE, THYROIDAB in the last 72 hours. Anemia Panel: No results for input(s): VITAMINB12, FOLATE, FERRITIN, TIBC, IRON, RETICCTPCT in the last 72 hours. Urine analysis:    Component Value Date/Time   COLORURINE YELLOW 09/10/2016 1707   APPEARANCEUR HAZY (A) 09/10/2016 1707   LABSPEC 1.018 09/10/2016 1707   PHURINE 5.0 09/10/2016 1707   GLUCOSEU NEGATIVE 09/10/2016 1707   HGBUR NEGATIVE 09/10/2016 1707   BILIRUBINUR NEGATIVE 09/10/2016 1707   KETONESUR NEGATIVE 09/10/2016 1707   PROTEINUR 30 (A) 09/10/2016 1707   UROBILINOGEN 0.2 12/18/2014 2111   NITRITE NEGATIVE 09/10/2016 1707   LEUKOCYTESUR MODERATE (A) 09/10/2016 1707   Sepsis Labs: @LABRCNTIP (procalcitonin:4,lacticidven:4) ) Recent Results (from the past 240 hour(s))  Culture, blood (routine x 2)     Status: None (Preliminary result)   Collection Time: 09/11/16 12:24 AM  Result Value Ref Range Status   Specimen Description BLOOD RIGHT ANTECUBITAL  Final   Special Requests   Final    BOTTLES DRAWN AEROBIC AND ANAEROBIC  10CC AER 6CC ANA   Culture  Setup Time   Final    GRAM POSITIVE COCCI IN CLUSTERS IN BOTH AEROBIC AND ANAEROBIC BOTTLES CRITICAL RESULT CALLED TO, READ BACK BY AND VERIFIED WITH: TO JLEDFORD(PHARMD) BY TCLEVELAND 09/12/16 AT 3:28AM    Culture GRAM POSITIVE COCCI  Final   Report Status PENDING  Incomplete  Blood Culture ID Panel (Reflexed)     Status: Abnormal   Collection Time: 09/11/16 12:24 AM  Result Value Ref Range Status    Enterococcus species NOT DETECTED NOT DETECTED Final   Listeria monocytogenes NOT DETECTED NOT DETECTED Final   Staphylococcus species DETECTED (A) NOT DETECTED Final    Comment: CRITICAL RESULT CALLED TO, READ BACK BY AND VERIFIED WITH: TO JLEDFORD(PHARMD) BY TCLEVELAND 09/12/2016 AT 3:28AM    Staphylococcus aureus NOT DETECTED NOT DETECTED Final   Methicillin resistance NOT DETECTED NOT DETECTED Final   Streptococcus species NOT DETECTED NOT DETECTED Final   Streptococcus agalactiae NOT DETECTED NOT DETECTED Final   Streptococcus pneumoniae NOT DETECTED NOT DETECTED Final   Streptococcus pyogenes NOT DETECTED NOT DETECTED Final   Acinetobacter baumannii NOT DETECTED NOT DETECTED Final   Enterobacteriaceae species NOT DETECTED NOT DETECTED Final   Enterobacter cloacae complex NOT DETECTED NOT DETECTED Final   Escherichia coli NOT DETECTED NOT DETECTED Final   Klebsiella oxytoca NOT DETECTED NOT DETECTED Final   Klebsiella pneumoniae NOT DETECTED NOT DETECTED Final   Proteus species NOT DETECTED NOT DETECTED Final   Serratia marcescens NOT DETECTED NOT DETECTED Final   Haemophilus influenzae NOT DETECTED NOT DETECTED Final   Neisseria meningitidis NOT DETECTED NOT DETECTED Final   Pseudomonas aeruginosa NOT DETECTED NOT DETECTED Final   Candida albicans NOT DETECTED NOT DETECTED Final   Candida glabrata NOT DETECTED NOT DETECTED Final   Candida krusei NOT DETECTED NOT DETECTED Final   Candida parapsilosis NOT DETECTED NOT DETECTED Final   Candida tropicalis NOT DETECTED NOT DETECTED Final  Culture, blood (routine x 2)     Status: None (Preliminary result)   Collection Time: 09/11/16 12:30 AM  Result Value Ref Range Status   Specimen Description BLOOD RIGHT FOREARM  Final   Special Requests BOTTLES DRAWN AEROBIC ONLY 10CC  Final   Culture NO GROWTH 1 DAY  Final   Report Status PENDING  Incomplete  MRSA PCR Screening     Status: None   Collection Time: 09/11/16 10:24 AM  Result  Value Ref Range Status   MRSA by PCR  NEGATIVE NEGATIVE Final    Comment:        The GeneXpert MRSA Assay (FDA approved for NASAL specimens only), is one component of a comprehensive MRSA colonization surveillance program. It is not intended to diagnose MRSA infection nor to guide or monitor treatment for MRSA infections.          Radiology Studies: No results found.    Scheduled Meds: . acetaminophen  325 mg Oral Once  . atorvastatin  10 mg Oral Daily  . B-complex with vitamin C  1 tablet Oral Daily  . bicalutamide  50 mg Oral Daily  .  ceFAZolin (ANCEF) IV  2 g Intravenous Q8H  . clopidogrel  75 mg Oral Daily  . enoxaparin (LOVENOX) injection  40 mg Subcutaneous Q24H  . feeding supplement (ENSURE ENLIVE)  237 mL Oral TID BM  . metoprolol tartrate  25 mg Oral BID  . sodium chloride flush  3 mL Intravenous Q12H  . tamsulosin  0.4 mg Oral QPC supper   Continuous Infusions: . sodium chloride 75 mL/hr at 09/13/16 0600     LOS: 2 days    Time spent in minutes: [place], MD [facility] Pager: [email or web address] Password [email or web address] 09/13/2016, 10:55 AM

## 2016-09-14 DIAGNOSIS — A419 Sepsis, unspecified organism: Secondary | ICD-10-CM

## 2016-09-14 DIAGNOSIS — N390 Urinary tract infection, site not specified: Secondary | ICD-10-CM

## 2016-09-14 DIAGNOSIS — I471 Supraventricular tachycardia: Secondary | ICD-10-CM

## 2016-09-14 LAB — URINE CULTURE: CULTURE: NO GROWTH

## 2016-09-14 MED ORDER — LORAZEPAM 0.5 MG PO TABS: 0.5000 mg | ORAL_TABLET | Freq: Every evening | ORAL | 0 refills | Status: AC | PRN

## 2016-09-14 MED ORDER — METOPROLOL TARTRATE 25 MG PO TABS: 25.0000 mg | ORAL_TABLET | Freq: Two times a day (BID) | ORAL | Status: AC

## 2016-09-14 MED ORDER — CEPHALEXIN 500 MG PO CAPS: 500.0000 mg | ORAL_CAPSULE | Freq: Four times a day (QID) | ORAL | Status: DC

## 2016-09-14 MED ORDER — LISINOPRIL 40 MG PO TABS: 20.0000 mg | ORAL_TABLET | Freq: Every day | ORAL | 3 refills | Status: AC

## 2016-09-14 MED ORDER — ENSURE ENLIVE PO LIQD: 237.0000 mL | Freq: Three times a day (TID) | ORAL | 12 refills | Status: DC

## 2016-09-14 NOTE — Discharge Summary (Signed)
Physician Discharge Summary  Craig [name] [id number] DOB: March 28, 1926 DOA: 09/10/2016  PCP: Myriam Jacobson, MD  Admit date: 09/10/2016 Discharge date: 09/14/2016  Admitted From: independent living  Disposition:  SNF   Recommendations for Outpatient Follow-up:  1. F/u thyroid functions in 3-4 wks 2. Continue to encourage water - minimal 1 L per day 3. Stop Keflex on 09/19/16  Discharge Condition:  stable   CODE STATUS:  DNR   Diet recommendation:  Heart healthy Consultations:      Discharge Diagnoses:  Principal Problem:   Unresponsive episode Active Problems:   PSVT (paroxysmal supraventricular tachycardia) (HCC)   Urinary tract infection   Sepsis ([place])   Essential hypertension   Tremor    Subjective: No complaints of tremor, fever, chills or palpations.   Brief Summary: Craig Sarafin Harrisis a 83 y.o.malewith medical history significant of chronic back pain with degenerative disc disease and compression fractures, TIA who presents for an episode today where he was difficult to arouse. According to the son and daughter, the patient was sleeping but when his aide tried to wake him up, he would not awaken. He began to gurgle and subsequently vomited while unconscious. His aide began to sit him up and shake him and eventually he was able to regain consciousness. He states that during the time where he was not responding, he was able to hear some things that were happening but could not talk or move. Family goes on to explain that he has had 2 episodes where he had sudden shaking followed by severe weakness. During these episodes the patient was not able to speak. After one such episode, on 1/16, he presented to the ER and underwent a CT scan and MRI of the brain which were negative. He is currently receiving rehabilitation at a skilled nursing facility. His son and daughter have noted that he has developed a tremor over the past 3 weeks which is mostly present in his  extremities when he moves them. His daughter also felt that he may have a UTI and his urine was sent to his PCP for UA and culture.   Hospital Course:   Unresponsive episode -With vomiting during the episode   - at home he had 2 spells of "shaking" followed by severe fatigue over the past few days- - head CT unrevealing- EEG showing encephalopathy without epileptiform activity -1/18-  he had an episode of whole body shaking while in the hospital which was note to be associated with a fever of 101 - he had a second episode the following day again associate with a fever and therefore these episodes of shaking were likely rigors rather than seizure activity  - no further episodes in > 48 hrs.   Active Problems:  UTI- sepsis with Leukocytosis/ fevers - Started Keflex but continued to have fevers- switched to 2 gm Ancef daily - fevers have resolved - 1 set of blood culture + for coag neg staph- likely contaminant  -  U culture has still not resulted- checked with [facility] since his daughter took a urine sample to them this week- UA was + but was, unfortunately, not sent for culture - post void residual is low therefore no retention issues - would recommend total of 1 wk of antibiotics - start date should be first afebrile day which was 1/18  Essential hypertension -Holding lisinopril due to mild creatinine increase on admission - will decrease dose of Lisinopril from 20 BID to 20 mg daily- adding Lopressor for  PSVT  PSVT - noted incidentally on tele monitor- added Metoprolol - no further episodes since - checked ECHO which was unrevealing- see report below - TSH normal- F T4 mildy elevated- would not start treatment but repeat in 1 month  History of TIAs -Continue Plavix and STatin  AKI - baseline Cr < 1.1 - admitted with Cr of 1.34 with BUN elevated to 25 -Held lisinopril -slow normal saline infusion ordered-Cr improved- appears much better hydrated therefore, stopped   IVF - continue to encourage oral hydrate of at least 1 L of water a day  History of prostate cancer -Continue Casodex -also on Lupron monthly  History of prostate cancer   Discharge Instructions  Discharge Instructions    Call MD for:  temperature >100.4    Complete by:  As directed    Diet - low sodium heart healthy    Complete by:  As directed    Increase activity slowly    Complete by:  As directed      Allergies as of 09/14/2016      Reactions   Penicillins Other (See Comments)   Patient doesn't remember what kind of reaction Has patient had a PCN reaction causing immediate rash, facial/tongue/throat swelling, SOB or lightheadedness with hypotension: n/a Has patient had a PCN reaction causing severe rash involving mucus membranes or skin necrosis:  N/a Has patient had a PCN reaction that required hospitalization: n/a Has patient had a PCN reaction occurring within the last 10 years: n/a If all of the above answers are "NO", then may proceed with Cephalosporin use.      Medication List    TAKE these medications   acetaminophen 500 MG tablet Commonly known as:  TYLENOL Take 1,000 mg by mouth at bedtime.   B-complex with vitamin C tablet Take 1 tablet by mouth every morning.   bicalutamide 50 MG tablet Commonly known as:  CASODEX Take 50 mg by mouth every morning.   cephALEXin 500 MG capsule Commonly known as:  KEFLEX Take 1 capsule (500 mg total) by mouth 4 (four) times daily.   chlorhexidine 0.12 % solution Commonly known as:  PERIDEX Use as directed 15 mLs in the mouth or throat daily as needed (mouth rinse).   clopidogrel 75 MG tablet Commonly known as:  PLAVIX Take 75 mg by mouth every morning.   feeding supplement (ENSURE ENLIVE) Liqd Take 237 mLs by mouth 3 (three) times daily between meals.   leuprolide 30 MG injection Commonly known as:  LUPRON Inject 30 mg into the muscle every 4 (four) months.   lisinopril 40 MG tablet Commonly known as:   PRINIVIL,ZESTRIL Take 0.5 tablets (20 mg total) by mouth daily. What changed:  when to take this   LORazepam 0.5 MG tablet Commonly known as:  ATIVAN Take 1 tablet (0.5 mg total) by mouth at bedtime as needed for sleep. What changed:  when to take this  reasons to take this   memantine 5 MG tablet Commonly known as:  NAMENDA Take 5 mg by mouth daily.   metoprolol tartrate 25 MG tablet Commonly known as:  LOPRESSOR Take 1 tablet (25 mg total) by mouth 2 (two) times daily.   omeprazole 20 MG capsule Commonly known as:  PRILOSEC Take 20 mg by mouth daily as needed (heartburn).   PROBIOTIC ADVANCED PO Take 1 tablet by mouth every morning.   SYSTANE 0.4-0.3 % Soln Generic drug:  Polyethyl Glycol-Propyl Glycol Apply 1 drop to eye daily.   XGEVA 120  MG/1.7ML Soln injection Generic drug:  denosumab Inject 120 mg into the skin every 30 (thirty) days.       Allergies  Allergen Reactions  . Penicillins Other (See Comments)    Patient doesn't remember what kind of reaction  Has patient had a PCN reaction causing immediate rash, facial/tongue/throat swelling, SOB or lightheadedness with hypotension: n/a Has patient had a PCN reaction causing severe rash involving mucus membranes or skin necrosis:  N/a Has patient had a PCN reaction that required hospitalization: n/a Has patient had a PCN reaction occurring within the last 10 years: n/a If all of the above answers are "NO", then may proceed with Cephalosporin use.      Procedures/Studies: Study Conclusions  - Left ventricle: The cavity size was normal. Wall thickness was   increased in a pattern of mild LVH. Systolic function was normal.   The estimated ejection fraction was in the range of 60% to 65%.   Wall motion was normal; there were no regional wall motion   abnormalities. - Aortic valve: Trileaflet; mildly thickened, mildly calcified   leaflets. - Pulmonary arteries: Systolic pressure was mildly increased.  PA   peak pressure: 45 mm Hg (S).  Impressions:  - Compared to the prior study, there has been no significant   interval change.  Dg Chest 2 View  Result Date: 09/10/2016 CLINICAL DATA:  Syncope. EXAM: CHEST  2 VIEW COMPARISON:  08/27/2016 . FINDINGS: Mediastinum and hilar structures are normal. COPD. No focal infiltrate. Calcified pulmonary nodules noted bilaterally consistent with granulomas. Left base pleuroparenchymal thickening again noted consistent with scarring. No pleural effusion or pneumothorax. Heart size normal. Old right rib fractures. IMPRESSION: No acute cardiopulmonary disease. COPD. Left base pleuroparenchymal thickening consistent scarring noted . Electronically Signed   By: Marcello Moores  Register   On: 09/10/2016 14:35   Dg Chest 2 View  Result Date: 08/27/2016 CLINICAL DATA:  Status post 3 episodes of falling for the past 10 days. No chest pain or shortness of breath. History of chronic productive cough. Previous MI. Former smoker. EXAM: CHEST  2 VIEW COMPARISON:  Chest x-ray of June 25, 2016 FINDINGS: The lungs remain hyperinflated. There is chronic flattening of the left hemidiaphragm and blunting of the costophrenic angles on the left. There is no alveolar infiltrate or pleural effusion. There is no pneumothorax or pneumomediastinum. The heart and pulmonary vascularity are normal. There is calcification in the wall of the aortic arch. The bony thorax exhibits no acute abnormality. There is evidence of previous granulomatous infection which is stable. There is mild multilevel degenerative disc disease. There is severe osteoarthritic change of the left shoulder. IMPRESSION: COPD. Chronic scarring at the left lung base. Previous granulomatous infection. No acute pneumonia nor CHF. Thoracic aortic atherosclerosis. Electronically Signed   By: David  Martinique M.D.   On: 08/27/2016 12:19   Ct Head Wo Contrast  Result Date: 09/10/2016 CLINICAL DATA:  83 year old male with altered mental  status, possible syncope episode. Initial encounter. EXAM: CT HEAD WITHOUT CONTRAST TECHNIQUE: Contiguous axial images were obtained from the base of the skull through the vertex without intravenous contrast. COMPARISON:  Brain MRI 10/07/2016 and earlier. FINDINGS: Brain: Stable gray-white matter differentiation throughout the brain. No midline shift, ventriculomegaly, mass effect, evidence of mass lesion, intracranial hemorrhage or evidence of cortically based acute infarction. Incidental dural calcification. Vascular: Calcified atherosclerosis at the skull base. No suspicious intracranial vascular hyperdensity. Skull: Stable visualized osseous structures. Sinuses/Orbits: Previous mastoidectomies. Visualized paranasal sinuses and mastoids are stable and  well pneumatized. Other: No acute orbit or scalp soft tissue finding. IMPRESSION: No acute intracranial abnormality. Stable non contrast CT appearance of the brain. Electronically Signed   By: Genevie Ann M.D.   On: 09/10/2016 14:19   Ct Head Wo Contrast  Result Date: 08/27/2016 CLINICAL DATA:  Pain following fall.  Left-sided facial droop EXAM: CT HEAD WITHOUT CONTRAST TECHNIQUE: Contiguous axial images were obtained from the base of the skull through the vertex without intravenous contrast. COMPARISON:  None. FINDINGS: Brain: There is moderate diffuse atrophy. There is no intracranial mass, hemorrhage, extra-axial fluid collection, or midline shift. There is patchy small vessel disease in the centra semiovale bilaterally. No acute infarct evident. Vascular: There is no hyperdense vessel. There is calcification in each carotid siphon. Skull: The bony calvarium appears intact. Sinuses/Orbits: There is mucosal thickening in multiple ethmoid air cells bilaterally. Other paranasal sinuses are clear. Orbits appear symmetric bilaterally. Other: Postoperative change noted in the mastoid region on the left. The mastoid air cells are clear. There is debris in each external  auditory canal. IMPRESSION: Atrophy with mild patchy periventricular small vessel disease. No acute infarct evident. No hemorrhage, mass, or extra-axial fluid collection. There is arteriovascular calcification at multiple sites. There is ethmoid sinus disease bilaterally. There is probable cerumen in each external auditory canal. Electronically Signed   By: Lowella Grip III M.D.   On: 08/27/2016 12:06   Mr Jeri Cos And Wo Contrast  Result Date: 08/27/2016 CLINICAL DATA:  Initial evaluation for recent fall. Intermittent headaches. Left facial droop. The EXAM: MRI HEAD WITHOUT AND WITH CONTRAST TECHNIQUE: Multiplanar, multiecho pulse sequences of the brain and surrounding structures were obtained without and with intravenous contrast. CONTRAST:  68mL MULTIHANCE GADOBENATE DIMEGLUMINE 529 MG/ML IV SOLN COMPARISON:  Prior head CT from earlier the same day. FINDINGS: Brain: Generalized age-related cerebral atrophy present. Patchy and confluent T2/FLAIR hyperintensity within the periventricular white matter most compatible with chronic small vessel ischemic disease, mild in nature. No abnormal foci of restricted diffusion to suggest acute or subacute infarct. Gray-white matter differentiation maintained. No evidence for acute or chronic intracranial hemorrhage. No encephalomalacia to suggest chronic infarction. The no mass lesion, midline shift, or mass effect. Mild ventricular prominence related to global parenchymal volume loss without hydrocephalus. No extra-axial fluid collection. Major dural sinuses are grossly patent. Pituitary gland within normal limits. Vascular: Major intracranial vascular flow voids are maintained. Skull and upper cervical spine: Craniocervical junction within normal limits. Scattered degenerative spondylolysis noted within the upper cervical spine without significant stenosis. Bone marrow signal intensity within normal limits. No scalp soft tissue abnormality. Sinuses/Orbits: Globes and  orbital soft tissues demonstrate no acute abnormality. Patient status post cataract extraction bilaterally. Paranasal sinuses are clear. No mastoid effusion. Inner ear structures normal. IMPRESSION: 1. No acute intracranial process identified. 2. Mild for age chronic microvascular ischemic disease. Electronically Signed   By: Jeannine Boga M.D.   On: 08/27/2016 20:03       Discharge Exam: Vitals:   09/14/16 0500 09/14/16 0621  BP:  (!) 141/83  Pulse: (!) 57   Resp: 16   Temp: 98.4 F (36.9 C) 98.8 F (37.1 C)   Vitals:   09/14/16 0045 09/14/16 0220 09/14/16 0500 09/14/16 0621  BP: 134/77 (!) 164/90  (!) 141/83  Pulse:   (!) 57   Resp: 18  16   Temp:   98.4 F (36.9 C) 98.8 F (37.1 C)  TempSrc:   Oral Oral  SpO2: 98%  98%  Weight:   64.7 kg (142 lb 9.6 oz)   Height:        General: Pt is alert, awake, not in acute distress Cardiovascular: RRR, S1/S2 +, no rubs, no gallops Respiratory: CTA bilaterally, no wheezing, no rhonchi Abdominal: Soft, NT, ND, bowel sounds + Extremities: no edema, no cyanosis    The results of significant diagnostics from this hospitalization (including imaging, microbiology, ancillary and laboratory) are listed below for reference.     Microbiology: Recent Results (from the past 240 hour(s))  Culture, blood (routine x 2)     Status: Abnormal (Preliminary result)   Collection Time: 09/11/16 12:24 AM  Result Value Ref Range Status   Specimen Description BLOOD RIGHT ANTECUBITAL  Final   Special Requests   Final    BOTTLES DRAWN AEROBIC AND ANAEROBIC  10CC AER 6CC ANA   Culture  Setup Time   Final    GRAM POSITIVE COCCI IN CLUSTERS IN BOTH AEROBIC AND ANAEROBIC BOTTLES CRITICAL RESULT CALLED TO, READ BACK BY AND VERIFIED WITH: TO JLEDFORD(PHARMD) BY TCLEVELAND 09/12/16 AT 3:28AM    Culture STAPHYLOCOCCUS SPECIES (COAGULASE NEGATIVE) (A)  Final   Report Status PENDING  Incomplete  Blood Culture ID Panel (Reflexed)     Status: Abnormal    Collection Time: 09/11/16 12:24 AM  Result Value Ref Range Status   Enterococcus species NOT DETECTED NOT DETECTED Final   Listeria monocytogenes NOT DETECTED NOT DETECTED Final   Staphylococcus species DETECTED (A) NOT DETECTED Final    Comment: CRITICAL RESULT CALLED TO, READ BACK BY AND VERIFIED WITH: TO JLEDFORD(PHARMD) BY TCLEVELAND 09/12/2016 AT 3:28AM    Staphylococcus aureus NOT DETECTED NOT DETECTED Final   Methicillin resistance NOT DETECTED NOT DETECTED Final   Streptococcus species NOT DETECTED NOT DETECTED Final   Streptococcus agalactiae NOT DETECTED NOT DETECTED Final   Streptococcus pneumoniae NOT DETECTED NOT DETECTED Final   Streptococcus pyogenes NOT DETECTED NOT DETECTED Final   Acinetobacter baumannii NOT DETECTED NOT DETECTED Final   Enterobacteriaceae species NOT DETECTED NOT DETECTED Final   Enterobacter cloacae complex NOT DETECTED NOT DETECTED Final   Escherichia coli NOT DETECTED NOT DETECTED Final   Klebsiella oxytoca NOT DETECTED NOT DETECTED Final   Klebsiella pneumoniae NOT DETECTED NOT DETECTED Final   Proteus species NOT DETECTED NOT DETECTED Final   Serratia marcescens NOT DETECTED NOT DETECTED Final   Haemophilus influenzae NOT DETECTED NOT DETECTED Final   Neisseria meningitidis NOT DETECTED NOT DETECTED Final   Pseudomonas aeruginosa NOT DETECTED NOT DETECTED Final   Candida albicans NOT DETECTED NOT DETECTED Final   Candida glabrata NOT DETECTED NOT DETECTED Final   Candida krusei NOT DETECTED NOT DETECTED Final   Candida parapsilosis NOT DETECTED NOT DETECTED Final   Candida tropicalis NOT DETECTED NOT DETECTED Final  Culture, blood (routine x 2)     Status: None (Preliminary result)   Collection Time: 09/11/16 12:30 AM  Result Value Ref Range Status   Specimen Description BLOOD RIGHT FOREARM  Final   Special Requests BOTTLES DRAWN AEROBIC ONLY 10CC  Final   Culture  Setup Time   Final    GRAM POSITIVE COCCI IN CLUSTERS AEROBIC BOTTLE  ONLY CRITICAL VALUE NOTED.  VALUE IS CONSISTENT WITH PREVIOUSLY REPORTED AND CALLED VALUE.    Culture GRAM POSITIVE COCCI  Final   Report Status PENDING  Incomplete  MRSA PCR Screening     Status: None   Collection Time: 09/11/16 10:24 AM  Result Value Ref Range Status   MRSA  by PCR NEGATIVE NEGATIVE Final    Comment:        The GeneXpert MRSA Assay (FDA approved for NASAL specimens only), is one component of a comprehensive MRSA colonization surveillance program. It is not intended to diagnose MRSA infection nor to guide or monitor treatment for MRSA infections.      Labs: BNP (last 3 results) No results for input(s): BNP in the last 8760 hours. Basic Metabolic Panel:  Recent Labs Lab 09/10/16 1325 09/10/16 1854 09/11/16 0030  NA 137 137 136  K 4.0 4.2 3.9  CL 102 98* 102  CO2 26 28 22   GLUCOSE 92 119* 97  BUN 25* 26* 26*  CREATININE 1.34* 1.20 1.23  CALCIUM 8.9 9.3 8.9   Liver Function Tests:  Recent Labs Lab 09/10/16 1325 09/10/16 1854  AST 22 24  ALT 14* 16*  ALKPHOS 59 69  BILITOT 0.8 0.7  PROT 6.1* 7.2  ALBUMIN 3.6 4.1   No results for input(s): LIPASE, AMYLASE in the last 168 hours. No results for input(s): AMMONIA in the last 168 hours. CBC:  Recent Labs Lab 09/10/16 1325 09/10/16 1854 09/11/16 0030  WBC 12.2* 13.4* 12.2*  NEUTROABS 8.6* 9.4*  --   HGB 13.3 14.6 15.0  HCT 39.5 42.9 43.7  MCV 97.5 97.3 96.3  PLT 171 178 158   Cardiac Enzymes:  Recent Labs Lab 09/10/16 1554  TROPONINI 0.03*   BNP: Invalid input(s): POCBNP CBG:  Recent Labs Lab 09/10/16 2356  GLUCAP 89   D-Dimer No results for input(s): DDIMER in the last 72 hours. Hgb A1c No results for input(s): HGBA1C in the last 72 hours. Lipid Profile No results for input(s): CHOL, HDL, LDLCALC, TRIG, CHOLHDL, LDLDIRECT in the last 72 hours. Thyroid function studies  Recent Labs  09/13/16 1211  TSH 1.947   Anemia work up No results for input(s): VITAMINB12,  FOLATE, FERRITIN, TIBC, IRON, RETICCTPCT in the last 72 hours. Urinalysis    Component Value Date/Time   COLORURINE YELLOW 09/10/2016 1707   APPEARANCEUR HAZY (A) 09/10/2016 1707   LABSPEC 1.018 09/10/2016 1707   PHURINE 5.0 09/10/2016 1707   GLUCOSEU NEGATIVE 09/10/2016 1707   HGBUR NEGATIVE 09/10/2016 1707   BILIRUBINUR NEGATIVE 09/10/2016 1707   KETONESUR NEGATIVE 09/10/2016 1707   PROTEINUR 30 (A) 09/10/2016 1707   UROBILINOGEN 0.2 12/18/2014 2111   NITRITE NEGATIVE 09/10/2016 1707   LEUKOCYTESUR MODERATE (A) 09/10/2016 1707   Sepsis Labs Invalid input(s): PROCALCITONIN,  WBC,  LACTICIDVEN Microbiology Recent Results (from the past 240 hour(s))  Culture, blood (routine x 2)     Status: Abnormal (Preliminary result)   Collection Time: 09/11/16 12:24 AM  Result Value Ref Range Status   Specimen Description BLOOD RIGHT ANTECUBITAL  Final   Special Requests   Final    BOTTLES DRAWN AEROBIC AND ANAEROBIC  10CC AER 6CC ANA   Culture  Setup Time   Final    GRAM POSITIVE COCCI IN CLUSTERS IN BOTH AEROBIC AND ANAEROBIC BOTTLES CRITICAL RESULT CALLED TO, READ BACK BY AND VERIFIED WITH: TO JLEDFORD(PHARMD) BY TCLEVELAND 09/12/16 AT 3:28AM    Culture STAPHYLOCOCCUS SPECIES (COAGULASE NEGATIVE) (A)  Final   Report Status PENDING  Incomplete  Blood Culture ID Panel (Reflexed)     Status: Abnormal   Collection Time: 09/11/16 12:24 AM  Result Value Ref Range Status   Enterococcus species NOT DETECTED NOT DETECTED Final   Listeria monocytogenes NOT DETECTED NOT DETECTED Final   Staphylococcus species DETECTED (A) NOT DETECTED Final  Comment: CRITICAL RESULT CALLED TO, READ BACK BY AND VERIFIED WITH: TO JLEDFORD(PHARMD) BY TCLEVELAND 09/12/2016 AT 3:28AM    Staphylococcus aureus NOT DETECTED NOT DETECTED Final   Methicillin resistance NOT DETECTED NOT DETECTED Final   Streptococcus species NOT DETECTED NOT DETECTED Final   Streptococcus agalactiae NOT DETECTED NOT DETECTED Final    Streptococcus pneumoniae NOT DETECTED NOT DETECTED Final   Streptococcus pyogenes NOT DETECTED NOT DETECTED Final   Acinetobacter baumannii NOT DETECTED NOT DETECTED Final   Enterobacteriaceae species NOT DETECTED NOT DETECTED Final   Enterobacter cloacae complex NOT DETECTED NOT DETECTED Final   Escherichia coli NOT DETECTED NOT DETECTED Final   Klebsiella oxytoca NOT DETECTED NOT DETECTED Final   Klebsiella pneumoniae NOT DETECTED NOT DETECTED Final   Proteus species NOT DETECTED NOT DETECTED Final   Serratia marcescens NOT DETECTED NOT DETECTED Final   Haemophilus influenzae NOT DETECTED NOT DETECTED Final   Neisseria meningitidis NOT DETECTED NOT DETECTED Final   Pseudomonas aeruginosa NOT DETECTED NOT DETECTED Final   Candida albicans NOT DETECTED NOT DETECTED Final   Candida glabrata NOT DETECTED NOT DETECTED Final   Candida krusei NOT DETECTED NOT DETECTED Final   Candida parapsilosis NOT DETECTED NOT DETECTED Final   Candida tropicalis NOT DETECTED NOT DETECTED Final  Culture, blood (routine x 2)     Status: None (Preliminary result)   Collection Time: 09/11/16 12:30 AM  Result Value Ref Range Status   Specimen Description BLOOD RIGHT FOREARM  Final   Special Requests BOTTLES DRAWN AEROBIC ONLY 10CC  Final   Culture  Setup Time   Final    GRAM POSITIVE COCCI IN CLUSTERS AEROBIC BOTTLE ONLY CRITICAL VALUE NOTED.  VALUE IS CONSISTENT WITH PREVIOUSLY REPORTED AND CALLED VALUE.    Culture GRAM POSITIVE COCCI  Final   Report Status PENDING  Incomplete  MRSA PCR Screening     Status: None   Collection Time: 09/11/16 10:24 AM  Result Value Ref Range Status   MRSA by PCR NEGATIVE NEGATIVE Final    Comment:        The GeneXpert MRSA Assay (FDA approved for NASAL specimens only), is one component of a comprehensive MRSA colonization surveillance program. It is not intended to diagnose MRSA infection nor to guide or monitor treatment for MRSA infections.      Time  coordinating discharge: Over 30 minutes  SIGNED:   Debbe Odea, MD  [facility] 09/14/2016, 8:34 AM Pager   If 7PM-7AM, please contact night-coverage [email or web address] Password [email or web address]

## 2016-09-14 NOTE — Clinical Social Work Placement (Signed)
   CLINICAL SOCIAL WORK PLACEMENT  NOTE  Date:  09/14/2016  Patient Details  Name: Craig [name] MRN: [id number] Date of Birth: 09/26/1925  Clinical Social Work is seeking post-discharge placement for this patient at the [place] level of care (*CSW will initial, date and re-position this form in  chart as items are completed):  Yes   Patient/family provided with [place] Work Department's list of facilities offering this level of care within the geographic area requested by the patient (or if unable, by the patient's family).  Yes   Patient/family informed of their freedom to choose among providers that offer the needed level of care, that participate in Medicare, Medicaid or managed care program needed by the patient, have an available bed and are willing to accept the patient.  Yes   Patient/family informed of [facility]'s ownership interest in [facility] and [facility], as well as of the fact that they are under no obligation to receive care at these facilities.  PASRR submitted to EDS on       PASRR number received on       Existing PASRR number confirmed on 09/14/16     FL2 transmitted to all facilities in geographic area requested by pt/family on       FL2 transmitted to all facilities within larger geographic area on       Patient informed that his/her managed care company has contracts with or will negotiate with certain facilities, including the following:            Patient/family informed of bed offers received.  Patient chooses bed at       Physician recommends and patient chooses bed at      Patient to be transferred to  [facility]) on 09/14/16.  Patient to be transferred to facility by  [facility])     Patient family notified on 09/14/16 of transfer.  Name of family member notified:  Dtr     PHYSICIAN       Additional Comment:    _______________________________________________ Braylin Formby, [place] 09/14/2016, 10:59 AM

## 2016-09-14 NOTE — Clinical Social Work Note (Signed)
Clinical Social Worker facilitated patient discharge including contacting patient family, [name] [phone] and facility, nurse Corrina to confirm patient discharge plans. Clinical information faxed to facility and family agreeable with plan. CSW arranged ambulance transport via [facility] to [facility].  RN to call report [phone] prior to discharge.  Clinical Social Worker will sign off for now as social work intervention is no longer needed. Please consult [place] again if new need arises.  [name] B. Joline Maxcy Clinical Social Work Dept Weekend Social Worker [phone] 10:53 AM

## 2016-09-15 LAB — CULTURE, BLOOD (ROUTINE X 2)

## 2016-09-19 ENCOUNTER — Encounter: Payer: Self-pay | Admitting: Internal Medicine

## 2016-09-19 ENCOUNTER — Non-Acute Institutional Stay (SKILLED_NURSING_FACILITY): Payer: Medicare Other | Admitting: Internal Medicine

## 2016-09-19 DIAGNOSIS — A419 Sepsis, unspecified organism: Secondary | ICD-10-CM

## 2016-09-19 DIAGNOSIS — I10 Essential (primary) hypertension: Secondary | ICD-10-CM | POA: Diagnosis not present

## 2016-09-19 DIAGNOSIS — C61 Malignant neoplasm of prostate: Secondary | ICD-10-CM

## 2016-09-19 DIAGNOSIS — M47817 Spondylosis without myelopathy or radiculopathy, lumbosacral region: Secondary | ICD-10-CM

## 2016-09-19 DIAGNOSIS — G459 Transient cerebral ischemic attack, unspecified: Secondary | ICD-10-CM | POA: Diagnosis not present

## 2016-09-19 DIAGNOSIS — I471 Supraventricular tachycardia: Secondary | ICD-10-CM

## 2016-09-19 NOTE — Progress Notes (Signed)
History and Physical    Provider:  [name], MD Location:  [name] Room Number: N39 Place of Service:  SNF (31)  PCP: Myriam Jacobson, MD Patient Care Team: Lorene Dy, MD as PCP - General (Internal Medicine) Wyatt Portela, MD (Hematology and Oncology) Franchot Gallo, MD as Attending Physician (Urology) Gaynelle Arabian, MD as Consulting Physician (Orthopedic Surgery)  Extended Emergency Contact Information Primary Emergency Contact: [name],[name] Address: [address]          [place], [place] [address] [place] Phone: [phone] Work Phone: [phone] Mobile Phone: [phone] Relation: Daughter Secondary Emergency Contact: [place] Phone: [phone] Mobile Phone: [phone] Relation: Son  Code Status: DNR Goals of Care: Advanced Directive information Advanced Directives 09/19/2016  Does Patient Have a Medical Advance Directive? Yes  Type of Paramedic of [place];Living will;Out of facility DNR (pink MOST or yellow form)  Does patient want to make changes to medical advance directive? -  Copy of [place] in Chart? Yes  Would patient like information on creating a medical advance directive? -  Pre-existing out of facility DNR order (yellow form or pink MOST form) Yellow form placed in chart (order not valid for inpatient use)      Chief Complaint  Patient presents with  . New Admit To SNF    following hospitalization 09/10/16 to 09/14/16 unresponsive episode.     HPI: Patient is a 83 y.o. male seen today for admission on 09/14/16 to [facility] SNF following hospitalization from 09/10/16 to 09/14/16. He had syncopal episode and was dtermined to have sepsis related to UTI. Had PSVT, HTN, and Tremor as other active problems.  He is here now for strengthening, training in safe mobility, and improvement in self care skills. He lives  alone and independently.  Past Medical History:  Diagnosis Date  . Anxiety state, unspecified   . CAD (coronary artery disease) 11/20/13  . Carbon monoxide exposure 11/24/13  . Cervicalgia   . Degenerative disc disease   . Degenerative joint disease   . Diverticulitis   . Diverticulosis   . Ejection fraction   . Genital herpes   . Genital herpes 07/10/2013  . GERD (gastroesophageal reflux disease)   . Hip arthritis 09/29/2013   Bilateral  . Hypertension   . Hyponatremia 11/24/13  . Hypoxia 11/24/13  . Insomnia 07/09/2013  . Lumbago   . Lumbosacral spondylolysis   . Lumbosacral spondylosis without myelopathy 09/29/2013  . Malignant neoplasm of prostate ([place])   . NSTEMI (non-ST elevated myocardial infarction) ([place]) 11/20/13  . OA (osteoarthritis) of knee 06/21/2013  . Other and unspecified hyperlipidemia   . Pneumonia 11/24/13  . Postoperative anemia due to acute blood loss 06/22/2013  . Prostate cancer ([place]) 2012   mets to bone  . TIA (transient ischemic attack)    Past Surgical History:  Procedure Laterality Date  . CATARACT EXTRACTION Bilateral   . MASTOIDECTOMY Bilateral   . TONSILLECTOMY    . TOTAL KNEE ARTHROPLASTY Right 06/21/2013   Procedure: RIGHT TOTAL KNEE ARTHROPLASTY;  Surgeon: Gearlean Alf, MD;  Location: [facility];  Service: Orthopedics;  Laterality: Right;. Dr. Wynelle Link    reports that he quit smoking about 32 years ago. He has a 30.00 pack-year smoking history. He has never used smokeless tobacco. He reports that he drinks about 1.2 oz of alcohol per week . He reports that he does not use drugs. Social  History   Social History  . Marital status: Divorced    Spouse name: N/A  . Number of children: N/A  . Years of education: N/A   Occupational History  . Not on file.   Social History Main Topics  . Smoking status: Former Smoker    Packs/day: 2.00    Years: 15.00    Quit date: 01/14/1984  . Smokeless tobacco: Never Used  . Alcohol use 1.2 oz/week    2 Shots  of liquor per week     Comment: occasional  . Drug use: No  . Sexual activity: No   Other Topics Concern  . Not on file   Social History Narrative  . No narrative on file     Family History  Problem Relation Age of Onset  . Heart disease Mother     Health Maintenance  Topic Date Due  . ZOSTAVAX  06/28/1988  . PNA vac Low Risk Adult (1 of 2 - PCV13) 06/28/1993  . INFLUENZA VACCINE  03/26/2016  . TETANUS/TDAP  05/06/2023    Allergies  Allergen Reactions  . Penicillins Other (See Comments)    Patient doesn't remember what kind of reaction  Has patient had a PCN reaction causing immediate rash, facial/tongue/throat swelling, SOB or lightheadedness with hypotension: n/a Has patient had a PCN reaction causing severe rash involving mucus membranes or skin necrosis:  N/a Has patient had a PCN reaction that required hospitalization: n/a Has patient had a PCN reaction occurring within the last 10 years: n/a If all of the above answers are "NO", then may proceed with Cephalosporin use.     Allergies as of 09/19/2016      Reactions   Penicillins Other (See Comments)   Patient doesn't remember what kind of reaction Has patient had a PCN reaction causing immediate rash, facial/tongue/throat swelling, SOB or lightheadedness with hypotension: n/a Has patient had a PCN reaction causing severe rash involving mucus membranes or skin necrosis:  N/a Has patient had a PCN reaction that required hospitalization: n/a Has patient had a PCN reaction occurring within the last 10 years: n/a If all of the above answers are "NO", then may proceed with Cephalosporin use.      Medication List       Accurate as of 09/19/16  3:43 PM. Always use your most recent med list.          acetaminophen 500 MG tablet Commonly known as:  TYLENOL Take 1,000 mg by mouth at bedtime.   B-complex with vitamin C tablet Take 1 tablet by mouth every morning.   bicalutamide 50 MG tablet Commonly known as:   CASODEX Take 50 mg by mouth every morning.   cephALEXin 500 MG capsule Commonly known as:  KEFLEX Take 1 capsule (500 mg total) by mouth 4 (four) times daily.   chlorhexidine 0.12 % solution Commonly known as:  PERIDEX Use as directed 15 mLs in the mouth or throat daily as needed (mouth rinse).   clopidogrel 75 MG tablet Commonly known as:  PLAVIX Take 75 mg by mouth every morning.   feeding supplement (ENSURE ENLIVE) Liqd Take 237 mLs by mouth 3 (three) times daily between meals.   leuprolide 30 MG injection Commonly known as:  LUPRON Inject 30 mg into the muscle every 4 (four) months.   lisinopril 40 MG tablet Commonly known as:  PRINIVIL,ZESTRIL Take 0.5 tablets (20 mg total) by mouth daily.   LORazepam 0.5 MG tablet Commonly known as:  ATIVAN Take 1 tablet (  0.5 mg total) by mouth at bedtime as needed for sleep.   memantine 5 MG tablet Commonly known as:  NAMENDA Take 5 mg by mouth daily.   metoprolol tartrate 25 MG tablet Commonly known as:  LOPRESSOR Take 1 tablet (25 mg total) by mouth 2 (two) times daily.   omeprazole 20 MG capsule Commonly known as:  PRILOSEC Take 20 mg by mouth daily as needed (heartburn).   PROBIOTIC ADVANCED PO Take 1 tablet by mouth every morning.   SYSTANE 0.4-0.3 % Soln Generic drug:  Polyethyl Glycol-Propyl Glycol Apply 1 drop to eye daily.   XGEVA 120 MG/1.7ML Soln injection Generic drug:  denosumab Inject 120 mg into the skin every 30 (thirty) days.       Review of Systems  Constitutional: Negative for activity change, appetite change, fatigue, fever and unexpected weight change.  HENT: Negative for congestion, ear pain, hearing loss, rhinorrhea, sore throat, tinnitus, trouble swallowing and voice change.   Eyes: Positive for visual disturbance (corrective lenses).       Corrective lenses  Respiratory: Negative for cough, choking, chest tightness, shortness of breath and wheezing.   Cardiovascular: Negative for chest  pain, palpitations and leg swelling.       CAD with hx MI mar 2015  Gastrointestinal: Negative for abdominal distention, abdominal pain, constipation, diarrhea and nausea.  Endocrine: Negative for cold intolerance, heat intolerance, polydipsia, polyphagia and polyuria.  Genitourinary: Negative for dysuria, frequency, testicular pain and urgency.       Hx prostate cancer. UTI with sepsis in jan 2018.  Musculoskeletal: Negative for arthralgias, back pain, gait problem, myalgias and neck pain.       SP right knee replacement  Skin: Negative for color change, pallor and rash.  Allergic/Immunologic: Negative.   Neurological: Negative for dizziness, tremors, syncope, speech difficulty, weakness, numbness and headaches.  Hematological: Negative for adenopathy. Does not bruise/bleed easily.  Psychiatric/Behavioral: Negative for behavioral problems, confusion, decreased concentration, hallucinations and sleep disturbance. The patient is not nervous/anxious.     Vitals:   09/19/16 1526  BP: 122/70  Pulse: 82  Resp: 20  Temp: 97.7 F (36.5 C)  SpO2: 98%  Weight: 140 lb (63.5 kg)  Height: 5\' 9"  (1.753 m)   Body mass index is 20.67 kg/m. Physical Exam  Constitutional: He is oriented to person, place, and time. He appears well-developed and well-nourished. No distress.  HENT:  Right Ear: External ear normal.  Left Ear: External ear normal.  Nose: Nose normal.  Mouth/Throat: Oropharynx is clear and moist. No oropharyngeal exudate.  Eyes: Conjunctivae and EOM are normal. Pupils are equal, round, and reactive to light.  Neck: No JVD present. No tracheal deviation present. No thyromegaly present.  Cardiovascular: Normal rate, regular rhythm, normal heart sounds and intact distal pulses.  Exam reveals no gallop and no friction rub.   No murmur heard. Pulmonary/Chest: No respiratory distress. He has no wheezes. He has no rales. He exhibits no tenderness.  Abdominal: He exhibits no distension and  no mass. There is no tenderness.  Musculoskeletal: Normal range of motion. He exhibits no edema or tenderness.  unstable on standiing  Lymphadenopathy:    He has no cervical adenopathy.  Neurological: He is alert and oriented to person, place, and time. He has normal reflexes. No cranial nerve deficit. Coordination normal.  Intention tremor in both arms  Skin: No rash noted. No erythema. No pallor.  Psychiatric: He has a normal mood and affect. His behavior is normal. Judgment and thought  content normal.    Labs reviewed: Basic Metabolic Panel:  Recent Labs  09/10/16 1325 09/10/16 1854 09/11/16 0030  NA 137 137 136  K 4.0 4.2 3.9  CL 102 98* 102  CO2 26 28 22   GLUCOSE 92 119* 97  BUN 25* 26* 26*  CREATININE 1.34* 1.20 1.23  CALCIUM 8.9 9.3 8.9   Liver Function Tests:  Recent Labs  09/10/16 1325 09/10/16 1854  AST 22 24  ALT 14* 16*  ALKPHOS 59 69  BILITOT 0.8 0.7  PROT 6.1* 7.2  ALBUMIN 3.6 4.1   No results for input(s): LIPASE, AMYLASE in the last 8760 hours. No results for input(s): AMMONIA in the last 8760 hours. CBC:  Recent Labs  09/10/16 1325 09/10/16 1854 09/11/16 0030  WBC 12.2* 13.4* 12.2*  NEUTROABS 8.6* 9.4*  --   HGB 13.3 14.6 15.0  HCT 39.5 42.9 43.7  MCV 97.5 97.3 96.3  PLT 171 178 158   Cardiac Enzymes:  Recent Labs  09/10/16 1554  TROPONINI 0.03*   BNP: Invalid input(s): POCBNP Lab Results  Component Value Date   HGBA1C 5.5 06/24/2013   Lab Results  Component Value Date   TSH 1.947 09/13/2016   No results found for: VITAMINB12 No results found for: FOLATE No results found for: IRON, TIBC, FERRITIN  Imaging and Procedures obtained prior to SNF admission: Dg Chest 2 View  Result Date: 09/10/2016 CLINICAL DATA:  Syncope. EXAM: CHEST  2 VIEW COMPARISON:  08/27/2016 . FINDINGS: Mediastinum and hilar structures are normal. COPD. No focal infiltrate. Calcified pulmonary nodules noted bilaterally consistent with granulomas. Left  base pleuroparenchymal thickening again noted consistent with scarring. No pleural effusion or pneumothorax. Heart size normal. Old right rib fractures. IMPRESSION: No acute cardiopulmonary disease. COPD. Left base pleuroparenchymal thickening consistent scarring noted . Electronically Signed   By: Marcello Moores  Register   On: 09/10/2016 14:35   Ct Head Wo Contrast  Result Date: 09/10/2016 CLINICAL DATA:  83 year old male with altered mental status, possible syncope episode. Initial encounter. EXAM: CT HEAD WITHOUT CONTRAST TECHNIQUE: Contiguous axial images were obtained from the base of the skull through the vertex without intravenous contrast. COMPARISON:  Brain MRI 10/07/2016 and earlier. FINDINGS: Brain: Stable gray-white matter differentiation throughout the brain. No midline shift, ventriculomegaly, mass effect, evidence of mass lesion, intracranial hemorrhage or evidence of cortically based acute infarction. Incidental dural calcification. Vascular: Calcified atherosclerosis at the skull base. No suspicious intracranial vascular hyperdensity. Skull: Stable visualized osseous structures. Sinuses/Orbits: Previous mastoidectomies. Visualized paranasal sinuses and mastoids are stable and well pneumatized. Other: No acute orbit or scalp soft tissue finding. IMPRESSION: No acute intracranial abnormality. Stable non contrast CT appearance of the brain. Electronically Signed   By: Genevie Ann M.D.   On: 09/10/2016 14:19    Assessment/Plan  1. Sepsis, due to unspecified organism [facility]) Resolved, but he remains weak. Engage in PT and OT  2. Prostate cancer ([place]) chronic  3. Transient cerebral ischemia, unspecified type Currently alert and appropriate with responses  4. Essential hypertension controlled  5. Lumbosacral spondylosis without myelopathy chronic mild low back discomfort  6. PSVT (paroxysmal supraventricular tachycardia) ([facility]) currently rate controlled and NSR

## 2016-09-24 ENCOUNTER — Encounter: Payer: Self-pay | Admitting: Nurse Practitioner

## 2016-09-24 ENCOUNTER — Non-Acute Institutional Stay (SKILLED_NURSING_FACILITY): Payer: Medicare Other | Admitting: Nurse Practitioner

## 2016-09-24 DIAGNOSIS — G4700 Insomnia, unspecified: Secondary | ICD-10-CM | POA: Diagnosis not present

## 2016-09-24 DIAGNOSIS — K219 Gastro-esophageal reflux disease without esophagitis: Secondary | ICD-10-CM | POA: Diagnosis not present

## 2016-09-24 DIAGNOSIS — C61 Malignant neoplasm of prostate: Secondary | ICD-10-CM

## 2016-09-24 DIAGNOSIS — I10 Essential (primary) hypertension: Secondary | ICD-10-CM | POA: Diagnosis not present

## 2016-09-24 DIAGNOSIS — G459 Transient cerebral ischemic attack, unspecified: Secondary | ICD-10-CM

## 2016-09-24 NOTE — Assessment & Plan Note (Signed)
Prn Lorazepam

## 2016-09-24 NOTE — Assessment & Plan Note (Signed)
Controlled, continue Lisinopril and Metoprolol.  

## 2016-09-24 NOTE — Progress Notes (Signed)
Location:  [facility] Room Number: 58 Place of Service:  SNF (31) Provider:  Takeysha Bonk, [place]  NP  [name], MD  Patient Care Team: Estill Dooms, MD as PCP - General (Internal Medicine) Wyatt Portela, MD (Hematology and Oncology) Franchot Gallo, MD as Attending Physician (Urology) Gaynelle Arabian, MD as Consulting Physician (Orthopedic Surgery) Kristeen Miss, MD as Consulting Physician (Neurosurgery) Krisha Beegle Otho Darner, NP as Nurse Practitioner (Internal Medicine)  Extended Emergency Contact Information Primary Emergency Contact: [name],[name] Address: [address]          [place],  [address] [place] Phone: [phone] Work Phone: [phone] Mobile Phone: [phone] Relation: Daughter Secondary Emergency Contact: [place] Phone: [phone] Mobile Phone: [phone] Relation: Son  Code Status:  DNR Goals of care: Advanced Directive information Advanced Directives 09/24/2016  Does Patient Have a Medical Advance Directive? Yes  Type of Paramedic of [place];Living will;Out of facility DNR (pink MOST or yellow form)  Does patient want to make changes to medical advance directive? No - Patient declined  Copy of [place] in Chart? Yes  Would patient like information on creating a medical advance directive? -  Pre-existing out of facility DNR order (yellow form or pink MOST form) Yellow form placed in chart (order not valid for inpatient use)     Chief Complaint  Patient presents with  . Acute Visit    Trouble urinating, bladder extended.    HPI:  Pt is a 83 y.o. male seen today for an acute visit for urinary retention, Hx of prostate cancer, f/u Urology ASAP.   Hospitalized 09/10/16 to 09/14/16 for unresponsive episode, UTI, AFib, HTN, tremor. CT, EEG, MRI brain was negative for acute intracranial process. UTI was identified, ABT x 1week, f/u  urology. Blood pressure is controlled on Lisinopril 20mg , hospital started Metoprolol for PSVT, Echo was unrevealing. TIAs on Plavix and statin. AKI resolved.    Past Medical History:  Diagnosis Date  . Anxiety state, unspecified   . CAD (coronary artery disease) 11/20/13  . Carbon monoxide exposure 11/24/13  . Cervicalgia   . Degenerative disc disease   . Degenerative joint disease   . Diverticulitis   . Diverticulosis   . Ejection fraction   . Genital herpes   . Genital herpes 07/10/2013  . GERD (gastroesophageal reflux disease)   . Hip arthritis 09/29/2013   Bilateral  . Hypertension   . Hyponatremia 11/24/13  . Hypoxia 11/24/13  . Insomnia 07/09/2013  . Lumbago   . Lumbosacral spondylolysis   . Lumbosacral spondylosis without myelopathy 09/29/2013  . Malignant neoplasm of prostate ([place])   . NSTEMI (non-ST elevated myocardial infarction) ([place]) 11/20/13  . OA (osteoarthritis) of knee 06/21/2013  . Other and unspecified hyperlipidemia   . Pneumonia 11/24/13  . Postoperative anemia due to acute blood loss 06/22/2013  . Prostate cancer ([place]) 2012   mets to bone  . TIA (transient ischemic attack)    Past Surgical History:  Procedure Laterality Date  . CATARACT EXTRACTION Bilateral   . MASTOIDECTOMY Bilateral   . TONSILLECTOMY    . TOTAL KNEE ARTHROPLASTY Right 06/21/2013   Procedure: RIGHT TOTAL KNEE ARTHROPLASTY;  Surgeon: Gearlean Alf, MD;  Location: [facility];  Service: Orthopedics;  Laterality: Right;. Dr. Wynelle Link    Allergies  Allergen Reactions  . Penicillins Other (See Comments)    Patient doesn't remember what kind of reaction  Has patient had  a PCN reaction causing immediate rash, facial/tongue/throat swelling, SOB or lightheadedness with hypotension: n/a Has patient had a PCN reaction causing severe rash involving mucus membranes or skin necrosis:  N/a Has patient had a PCN reaction that required hospitalization: n/a Has patient had a PCN reaction occurring within the  last 10 years: n/a If all of the above answers are "NO", then may proceed with Cephalosporin use.     Allergies as of 09/24/2016      Reactions   Penicillins Other (See Comments)   Patient doesn't remember what kind of reaction Has patient had a PCN reaction causing immediate rash, facial/tongue/throat swelling, SOB or lightheadedness with hypotension: n/a Has patient had a PCN reaction causing severe rash involving mucus membranes or skin necrosis:  N/a Has patient had a PCN reaction that required hospitalization: n/a Has patient had a PCN reaction occurring within the last 10 years: n/a If all of the above answers are "NO", then may proceed with Cephalosporin use.      Medication List       Accurate as of 09/24/16  2:20 PM. Always use your most recent med list.          acetaminophen 500 MG tablet Commonly known as:  TYLENOL Take 1,000 mg by mouth at bedtime.   ALIGN 4 MG Caps Take 4 mg by mouth daily.   B-complex with vitamin C tablet Take 1 tablet by mouth every morning.   bicalutamide 50 MG tablet Commonly known as:  CASODEX Take 50 mg by mouth every morning.   chlorhexidine 0.12 % solution Commonly known as:  PERIDEX Use as directed 15 mLs in the mouth or throat daily as needed (mouth rinse).   clopidogrel 75 MG tablet Commonly known as:  PLAVIX Take 75 mg by mouth every morning.   feeding supplement (ENSURE ENLIVE) Liqd Take 237 mLs by mouth 3 (three) times daily between meals.   leuprolide 30 MG injection Commonly known as:  LUPRON Inject 30 mg into the muscle every 4 (four) months.   lisinopril 40 MG tablet Commonly known as:  PRINIVIL,ZESTRIL Take 0.5 tablets (20 mg total) by mouth daily.   LORazepam 0.5 MG tablet Commonly known as:  ATIVAN Take 1 tablet (0.5 mg total) by mouth at bedtime as needed for sleep.   memantine 5 MG tablet Commonly known as:  NAMENDA Take 5 mg by mouth daily.   metoprolol tartrate 25 MG tablet Commonly known as:   LOPRESSOR Take 1 tablet (25 mg total) by mouth 2 (two) times daily.   omeprazole 20 MG capsule Commonly known as:  PRILOSEC Take 20 mg by mouth daily as needed (heartburn).   polyethylene glycol packet Commonly known as:  MIRALAX / GLYCOLAX Take 17 g by mouth daily as needed.   SYSTANE 0.4-0.3 % Soln Generic drug:  Polyethyl Glycol-Propyl Glycol Apply 1 drop to eye daily.   XGEVA 120 MG/1.7ML Soln injection Generic drug:  denosumab Inject 120 mg into the skin every 30 (thirty) days.       Review of Systems  Constitutional: Negative for activity change, appetite change, fatigue, fever and unexpected weight change.  HENT: Negative for congestion, ear pain, hearing loss, rhinorrhea, sore throat, tinnitus, trouble swallowing and voice change.   Eyes: Positive for visual disturbance (corrective lenses).       Corrective lenses  Respiratory: Negative for cough, choking, chest tightness, shortness of breath and wheezing.   Cardiovascular: Negative for chest pain, palpitations and leg swelling.  CAD with hx MI mar 2015  Gastrointestinal: Negative for abdominal distention, abdominal pain, constipation, diarrhea and nausea.  Endocrine: Negative for cold intolerance, heat intolerance, polydipsia, polyphagia and polyuria.  Genitourinary: Positive for difficulty urinating and frequency. Negative for dysuria, testicular pain and urgency.       Hx prostate cancer. UTI with sepsis in jan 2018.  Musculoskeletal: Negative for arthralgias, back pain, gait problem, myalgias and neck pain.       SP right knee replacement  Skin: Negative for color change, pallor and rash.  Allergic/Immunologic: Negative.   Neurological: Negative for dizziness, tremors, syncope, speech difficulty, weakness, numbness and headaches.  Hematological: Negative for adenopathy. Does not bruise/bleed easily.  Psychiatric/Behavioral: Negative for behavioral problems, confusion, decreased concentration, hallucinations  and sleep disturbance. The patient is not nervous/anxious.     Immunization History  Administered Date(s) Administered  . Tdap 05/05/2013   Pertinent  Health Maintenance Due  Topic Date Due  . PNA vac Low Risk Adult (1 of 2 - PCV13) 06/28/1993  . INFLUENZA VACCINE  03/26/2016   No flowsheet data found. Functional Status Survey:    Vitals:   09/24/16 1148  BP: 116/69  Pulse: 60  Resp: 18  Temp: 98.2 F (36.8 C)  SpO2: 95%  Weight: 140 lb (63.5 kg)  Height: 5\' 9"  (1.753 m)   Body mass index is 20.67 kg/m. Physical Exam  Constitutional: He is oriented to person, place, and time. He appears well-developed and well-nourished. No distress.  HENT:  Right Ear: External ear normal.  Left Ear: External ear normal.  Nose: Nose normal.  Mouth/Throat: Oropharynx is clear and moist. No oropharyngeal exudate.  Eyes: Conjunctivae and EOM are normal. Pupils are equal, round, and reactive to light.  Neck: No JVD present. No tracheal deviation present. No thyromegaly present.  Cardiovascular: Normal rate, regular rhythm, normal heart sounds and intact distal pulses.  Exam reveals no gallop and no friction rub.   No murmur heard. Pulmonary/Chest: No respiratory distress. He has no wheezes. He has no rales. He exhibits no tenderness.  Abdominal: He exhibits no distension and no mass. There is no tenderness.  Musculoskeletal: Normal range of motion. He exhibits no edema or tenderness.  unstable on standiing  Lymphadenopathy:    He has no cervical adenopathy.  Neurological: He is alert and oriented to person, place, and time. He has normal reflexes. No cranial nerve deficit. Coordination normal.  Intention tremor in both arms  Skin: No rash noted. No erythema. No pallor.  Psychiatric: He has a normal mood and affect. His behavior is normal. Judgment and thought content normal.    Labs reviewed:  Recent Labs  09/10/16 1325 09/10/16 1854 09/11/16 0030  NA 137 137 136  K 4.0 4.2 3.9   CL 102 98* 102  CO2 26 28 22   GLUCOSE 92 119* 97  BUN 25* 26* 26*  CREATININE 1.34* 1.20 1.23  CALCIUM 8.9 9.3 8.9    Recent Labs  09/10/16 1325 09/10/16 1854  AST 22 24  ALT 14* 16*  ALKPHOS 59 69  BILITOT 0.8 0.7  PROT 6.1* 7.2  ALBUMIN 3.6 4.1    Recent Labs  09/10/16 1325 09/10/16 1854 09/11/16 0030  WBC 12.2* 13.4* 12.2*  NEUTROABS 8.6* 9.4*  --   HGB 13.3 14.6 15.0  HCT 39.5 42.9 43.7  MCV 97.5 97.3 96.3  PLT 171 178 158   Lab Results  Component Value Date   TSH 1.947 09/13/2016   Lab Results  Component  Value Date   HGBA1C 5.5 06/24/2013   Lab Results  Component Value Date   CHOL 132 11/22/2013   HDL 77 11/22/2013   LDLCALC 44 11/22/2013   TRIG 55 11/22/2013   CHOLHDL 1.7 11/22/2013    Significant Diagnostic Results in last 30 days:  Dg Chest 2 View  Result Date: 09/10/2016 CLINICAL DATA:  Syncope. EXAM: CHEST  2 VIEW COMPARISON:  08/27/2016 . FINDINGS: Mediastinum and hilar structures are normal. COPD. No focal infiltrate. Calcified pulmonary nodules noted bilaterally consistent with granulomas. Left base pleuroparenchymal thickening again noted consistent with scarring. No pleural effusion or pneumothorax. Heart size normal. Old right rib fractures. IMPRESSION: No acute cardiopulmonary disease. COPD. Left base pleuroparenchymal thickening consistent scarring noted . Electronically Signed   By: Marcello Moores  Register   On: 09/10/2016 14:35   Dg Chest 2 View  Result Date: 08/27/2016 CLINICAL DATA:  Status post 3 episodes of falling for the past 10 days. No chest pain or shortness of breath. History of chronic productive cough. Previous MI. Former smoker. EXAM: CHEST  2 VIEW COMPARISON:  Chest x-ray of June 25, 2016 FINDINGS: The lungs remain hyperinflated. There is chronic flattening of the left hemidiaphragm and blunting of the costophrenic angles on the left. There is no alveolar infiltrate or pleural effusion. There is no pneumothorax or  pneumomediastinum. The heart and pulmonary vascularity are normal. There is calcification in the wall of the aortic arch. The bony thorax exhibits no acute abnormality. There is evidence of previous granulomatous infection which is stable. There is mild multilevel degenerative disc disease. There is severe osteoarthritic change of the left shoulder. IMPRESSION: COPD. Chronic scarring at the left lung base. Previous granulomatous infection. No acute pneumonia nor CHF. Thoracic aortic atherosclerosis. Electronically Signed   By: David  Martinique M.D.   On: 08/27/2016 12:19   Ct Head Wo Contrast  Result Date: 09/10/2016 CLINICAL DATA:  83 year old male with altered mental status, possible syncope episode. Initial encounter. EXAM: CT HEAD WITHOUT CONTRAST TECHNIQUE: Contiguous axial images were obtained from the base of the skull through the vertex without intravenous contrast. COMPARISON:  Brain MRI 10/07/2016 and earlier. FINDINGS: Brain: Stable gray-white matter differentiation throughout the brain. No midline shift, ventriculomegaly, mass effect, evidence of mass lesion, intracranial hemorrhage or evidence of cortically based acute infarction. Incidental dural calcification. Vascular: Calcified atherosclerosis at the skull base. No suspicious intracranial vascular hyperdensity. Skull: Stable visualized osseous structures. Sinuses/Orbits: Previous mastoidectomies. Visualized paranasal sinuses and mastoids are stable and well pneumatized. Other: No acute orbit or scalp soft tissue finding. IMPRESSION: No acute intracranial abnormality. Stable non contrast CT appearance of the brain. Electronically Signed   By: Genevie Ann M.D.   On: 09/10/2016 14:19   Ct Head Wo Contrast  Result Date: 08/27/2016 CLINICAL DATA:  Pain following fall.  Left-sided facial droop EXAM: CT HEAD WITHOUT CONTRAST TECHNIQUE: Contiguous axial images were obtained from the base of the skull through the vertex without intravenous contrast.  COMPARISON:  None. FINDINGS: Brain: There is moderate diffuse atrophy. There is no intracranial mass, hemorrhage, extra-axial fluid collection, or midline shift. There is patchy small vessel disease in the centra semiovale bilaterally. No acute infarct evident. Vascular: There is no hyperdense vessel. There is calcification in each carotid siphon. Skull: The bony calvarium appears intact. Sinuses/Orbits: There is mucosal thickening in multiple ethmoid air cells bilaterally. Other paranasal sinuses are clear. Orbits appear symmetric bilaterally. Other: Postoperative change noted in the mastoid region on the left. The mastoid air cells are  clear. There is debris in each external auditory canal. IMPRESSION: Atrophy with mild patchy periventricular small vessel disease. No acute infarct evident. No hemorrhage, mass, or extra-axial fluid collection. There is arteriovascular calcification at multiple sites. There is ethmoid sinus disease bilaterally. There is probable cerumen in each external auditory canal. Electronically Signed   By: Lowella Grip III M.D.   On: 08/27/2016 12:06   Mr Jeri Cos And Wo Contrast  Result Date: 08/27/2016 CLINICAL DATA:  Initial evaluation for recent fall. Intermittent headaches. Left facial droop. The EXAM: MRI HEAD WITHOUT AND WITH CONTRAST TECHNIQUE: Multiplanar, multiecho pulse sequences of the brain and surrounding structures were obtained without and with intravenous contrast. CONTRAST:  23mL MULTIHANCE GADOBENATE DIMEGLUMINE 529 MG/ML IV SOLN COMPARISON:  Prior head CT from earlier the same day. FINDINGS: Brain: Generalized age-related cerebral atrophy present. Patchy and confluent T2/FLAIR hyperintensity within the periventricular white matter most compatible with chronic small vessel ischemic disease, mild in nature. No abnormal foci of restricted diffusion to suggest acute or subacute infarct. Gray-white matter differentiation maintained. No evidence for acute or chronic  intracranial hemorrhage. No encephalomalacia to suggest chronic infarction. The no mass lesion, midline shift, or mass effect. Mild ventricular prominence related to global parenchymal volume loss without hydrocephalus. No extra-axial fluid collection. Major dural sinuses are grossly patent. Pituitary gland within normal limits. Vascular: Major intracranial vascular flow voids are maintained. Skull and upper cervical spine: Craniocervical junction within normal limits. Scattered degenerative spondylolysis noted within the upper cervical spine without significant stenosis. Bone marrow signal intensity within normal limits. No scalp soft tissue abnormality. Sinuses/Orbits: Globes and orbital soft tissues demonstrate no acute abnormality. Patient status post cataract extraction bilaterally. Paranasal sinuses are clear. No mastoid effusion. Inner ear structures normal. IMPRESSION: 1. No acute intracranial process identified. 2. Mild for age chronic microvascular ischemic disease. Electronically Signed   By: Jeannine Boga M.D.   On: 08/27/2016 20:03    Assessment/Plan Prostate cancer ([place]) Bone mets Treated with Firmagon since  10/22/2010 and subsequently Lupron on 11/27/2010 and has been on it since that time.  Casodex added on 01/06/2013. He started Xgeva in June of 2012 09/24/16 difficulty emptying bladder, dribbling, afebrile, Urology ASAP  TIA (transient ischemic attack) Continue Plavix and Statin  Essential hypertension Controlled, continue Lisinopril and Metoprolol.   GERD (gastroesophageal reflux disease) Stable. Continue Prilosec.   Insomnia Prn Lorazepam     Family/ staff Communication: [place] vs [place] when able. Urology f/u for difficulty urinating   Labs/tests ordered: pending TSH

## 2016-09-24 NOTE — Assessment & Plan Note (Signed)
Bone mets Treated with Firmagon since  10/22/2010 and subsequently Lupron on 11/27/2010 and has been on it since that time.  Casodex added on 01/06/2013. He started Xgeva in June of 2012 09/24/16 difficulty emptying bladder, dribbling, afebrile, Urology ASAP

## 2016-09-24 NOTE — Assessment & Plan Note (Signed)
Continue Plavix and Statin

## 2016-09-24 NOTE — Assessment & Plan Note (Addendum)
Stable.  Continue Prilosec.

## 2016-09-27 DIAGNOSIS — R339 Retention of urine, unspecified: Secondary | ICD-10-CM | POA: Insufficient documentation

## 2016-10-04 ENCOUNTER — Encounter: Payer: Self-pay | Admitting: Nurse Practitioner

## 2016-10-11 ENCOUNTER — Telehealth: Payer: Self-pay | Admitting: Oncology

## 2016-10-11 ENCOUNTER — Ambulatory Visit (HOSPITAL_BASED_OUTPATIENT_CLINIC_OR_DEPARTMENT_OTHER): Payer: Medicare Other | Admitting: Oncology

## 2016-10-11 VITALS — BP 120/59 | HR 54 | Temp 98.2°F | Resp 18 | Ht 69.0 in | Wt 140.0 lb

## 2016-10-11 DIAGNOSIS — C61 Malignant neoplasm of prostate: Secondary | ICD-10-CM | POA: Diagnosis present

## 2016-10-11 DIAGNOSIS — E291 Testicular hypofunction: Secondary | ICD-10-CM

## 2016-10-11 DIAGNOSIS — C7951 Secondary malignant neoplasm of bone: Secondary | ICD-10-CM | POA: Diagnosis not present

## 2016-10-11 DIAGNOSIS — R634 Abnormal weight loss: Secondary | ICD-10-CM

## 2016-10-11 DIAGNOSIS — R5383 Other fatigue: Secondary | ICD-10-CM | POA: Diagnosis not present

## 2016-10-11 NOTE — Telephone Encounter (Signed)
Appointments scheduled per 2/16 LOS. Patient given AVS report and calendars with future scheduled appointments. °

## 2016-10-11 NOTE — Progress Notes (Signed)
Hematology and Oncology Follow Up Visit  Craig [name] ZN:[id number] [birth year] 83 y.o. 10/11/2016 1:38 PM   Principle Diagnosis: 83 year old gentleman with prostate cancer diagnosed in January of 2012. It a Gleason score 4+5 = 9.  Clinical stage TIIC. He has advanced disease to the bone   Current therapy:  He has been on [place] since  10/22/2010 and subsequently Lupron on 11/27/2010 and has been on it since that time.  Casodex added on 01/06/2013. He started [place] in June of 2012  Interim History:  Craig [name] presents today for a followup visit with his daughter. Since his last visit, he was hospitalized on 09/14/2016 for a syncopal episode. He was diagnosed with supraventricular tachycardia which has resolved upon his discharge. He is currently reside in a skilled nursing facility and is recovering slowly. He is experiencing more fatigue and tiredness and his performance status is limited. It is improving slowly at this time and his appetite is improving as well. He lost 8 pounds since the last visit.  He denied any specific symptoms related to his prostate cancer including arthralgias, bone fractures or constitutional symptoms. He denied any urination difficulties.  He does not report any headaches, blurry vision, syncope or seizures. He does not report any fevers, chills or sweats. He does not report any cough, wheezing or hemoptysis. Does not report any nausea, vomiting, abdominal pain. He does not report any constipation or diarrhea. He does not report any frequency urgency or hesitancy. Does not report any lymphadenopathy or petechiae. Remaining review of systems unremarkable.  Medications: I have reviewed the patient's current medications.  Current Outpatient Prescriptions  Medication Sig Dispense Refill  . acetaminophen (TYLENOL) 500 MG tablet Take 1,000 mg by mouth at bedtime.    . B Complex-C (B-COMPLEX WITH VITAMIN C) tablet Take 1 tablet by mouth every morning.     .  bicalutamide (CASODEX) 50 MG tablet Take 50 mg by mouth every morning.     . chlorhexidine (PERIDEX) 0.12 % solution Use as directed 15 mLs in the mouth or throat daily as needed (mouth rinse).    Marland Kitchen clopidogrel (PLAVIX) 75 MG tablet Take 75 mg by mouth every morning.     . denosumab (XGEVA) 120 MG/1.7ML SOLN injection Inject 120 mg into the skin every 30 (thirty) days.     . feeding supplement, ENSURE ENLIVE, (ENSURE ENLIVE) LIQD Take 237 mLs by mouth 3 (three) times daily between meals. 237 mL 12  . leuprolide (LUPRON) 30 MG injection Inject 30 mg into the muscle every 4 (four) months.    Marland Kitchen lisinopril (PRINIVIL,ZESTRIL) 40 MG tablet Take 0.5 tablets (20 mg total) by mouth daily. 90 tablet 3  . LORazepam (ATIVAN) 0.5 MG tablet Take 1 tablet (0.5 mg total) by mouth at bedtime as needed for sleep. 10 tablet 0  . memantine (NAMENDA) 5 MG tablet Take 5 mg by mouth daily.    . metoprolol tartrate (LOPRESSOR) 25 MG tablet Take 1 tablet (25 mg total) by mouth 2 (two) times daily. 60 tablet   . omeprazole (PRILOSEC) 20 MG capsule Take 20 mg by mouth daily as needed (heartburn).    Vladimir Faster Glycol-Propyl Glycol (SYSTANE) 0.4-0.3 % SOLN Apply 1 drop to eye daily.    . polyethylene glycol (MIRALAX / GLYCOLAX) packet Take 17 g by mouth daily as needed.    . Probiotic Product (ALIGN) 4 MG CAPS Take 4 mg by mouth daily.     No current facility-administered medications for this visit.  Allergies:  Allergies  Allergen Reactions  . Penicillins Other (See Comments)    Patient doesn't remember what kind of reaction  Has patient had a PCN reaction causing immediate rash, facial/tongue/throat swelling, SOB or lightheadedness with hypotension: n/a Has patient had a PCN reaction causing severe rash involving mucus membranes or skin necrosis:  N/a Has patient had a PCN reaction that required hospitalization: n/a Has patient had a PCN reaction occurring within the last 10 years: n/a If all of the above  answers are "NO", then may proceed with Cephalosporin use.     Past Medical History, Surgical history, Social history, and Family History were reviewed and updated.    Physical Exam: Blood pressure (!) 120/59, pulse (!) 54, temperature 98.2 F (36.8 C), temperature source Oral, resp. rate 18, height 5\' 9"  (1.753 m), weight 140 lb (63.5 kg), SpO2 98 %. ECOG: 2 General appearance: Frail elderly gentleman appeared without distress. Head: Normocephalic, without obvious abnormality no oral thrush noted. Neck: no adenopathy Lymph nodes: Cervical, supraclavicular, and axillary nodes normal. Heart:regular rate and rhythm, S1, S2 normal, no murmur, click, rub or gallop Lung:chest clear, no wheezing, rales, normal symmetric air entry. Abdomen: soft, non-tender, without masses or organomegaly no rebound or guarding. EXT:no erythema, induration, or nodules   Lab Results: Lab Results  Component Value Date   WBC 12.2 (H) 09/11/2016   HGB 15.0 09/11/2016   HCT 43.7 09/11/2016   MCV 96.3 09/11/2016   PLT 158 09/11/2016     Chemistry      Component Value Date/Time   NA 136 09/11/2016 0030   NA 137 12/06/2013   NA 141 12/15/2012 0934   K 3.9 09/11/2016 0030   K 4.0 12/15/2012 0934   CL 102 09/11/2016 0030   CL 100 12/15/2012 0934   CO2 22 09/11/2016 0030   CO2 30 (H) 12/15/2012 0934   BUN 26 (H) 09/11/2016 0030   BUN 14 12/06/2013   BUN 18.5 12/15/2012 0934   CREATININE 1.23 09/11/2016 0030   CREATININE 0.96 06/04/2016 1508   CREATININE 1.0 12/15/2012 0934   GLU 88 12/06/2013      Component Value Date/Time   CALCIUM 8.9 09/11/2016 0030   CALCIUM 9.8 12/15/2012 0934   ALKPHOS 69 09/10/2016 1854   ALKPHOS 37 (L) 12/15/2012 0934   AST 24 09/10/2016 1854   AST 20 12/15/2012 0934   ALT 16 (L) 09/10/2016 1854   ALT 19 12/15/2012 0934   BILITOT 0.7 09/10/2016 1854   BILITOT 0.65 12/15/2012 0934     PSA was 0.64 in May 2017.     Impression and Plan:    83 year old  gentleman with the following issues:  1. Advanced prostate cancer diagnosed in January 2012. He had a Gleason score 4+5 = 9 and at least 4 cores. All 12 cores were involved with his cancer. He had bony metastasis at the time. He was treated with androgen deprivation at this time.  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 His PSA in May 2017 was 0.64.  Options of therapy which include Zytiga and Xtandi were reviewed today and I feel it is reasonable to hold off on this therapy for the time being. He is recovering from a recent hospitalization with issues of aggressive fatigue and weight loss. I feel that his cancer is very slowly moving at this time and is completely asymptomatic. I fear the complications associated with second line hormonal therapy might outweigh the benefit at this time.  I recommended continued observation and surveillance and repeat PSA in 4 months.   2. Bony disease: He is currently on Xgeva. Doing well with that at the time being given at Dr. Diona Fanti  3. Bony pain: Unchanged at this time.  4. Follow-up: In 4 months.  [id number] 2/16/20181:38 PM

## 2016-10-22 ENCOUNTER — Non-Acute Institutional Stay (SKILLED_NURSING_FACILITY): Payer: Medicare Other | Admitting: Nurse Practitioner

## 2016-10-22 ENCOUNTER — Encounter: Payer: Self-pay | Admitting: Nurse Practitioner

## 2016-10-22 DIAGNOSIS — G459 Transient cerebral ischemic attack, unspecified: Secondary | ICD-10-CM

## 2016-10-22 DIAGNOSIS — K219 Gastro-esophageal reflux disease without esophagitis: Secondary | ICD-10-CM

## 2016-10-22 DIAGNOSIS — N390 Urinary tract infection, site not specified: Secondary | ICD-10-CM

## 2016-10-22 DIAGNOSIS — M1710 Unilateral primary osteoarthritis, unspecified knee: Secondary | ICD-10-CM

## 2016-10-22 DIAGNOSIS — I471 Supraventricular tachycardia: Secondary | ICD-10-CM

## 2016-10-22 DIAGNOSIS — I10 Essential (primary) hypertension: Secondary | ICD-10-CM | POA: Diagnosis not present

## 2016-10-22 DIAGNOSIS — G4700 Insomnia, unspecified: Secondary | ICD-10-CM

## 2016-10-22 DIAGNOSIS — R413 Other amnesia: Secondary | ICD-10-CM

## 2016-10-22 DIAGNOSIS — M179 Osteoarthritis of knee, unspecified: Secondary | ICD-10-CM

## 2016-10-22 DIAGNOSIS — C61 Malignant neoplasm of prostate: Secondary | ICD-10-CM

## 2016-10-22 NOTE — Assessment & Plan Note (Signed)
UA c/s in am.

## 2016-10-22 NOTE — Assessment & Plan Note (Signed)
F/u Urology, last visited 10/11/16

## 2016-10-22 NOTE — Assessment & Plan Note (Signed)
Controlled, continue Lisinopril and Metoprolol.  

## 2016-10-22 NOTE — Assessment & Plan Note (Signed)
Heart rate is in control, continue Metoprolol  

## 2016-10-22 NOTE — Assessment & Plan Note (Signed)
Managed pain with Tylenol 1000mg qhs 

## 2016-10-22 NOTE — Progress Notes (Signed)
Location:  [facility] Room Number: 53 Place of Service:  SNF (31) Provider:  Orest Dygert, Manxie  NP  [name], MD  Patient Care Team: Estill Dooms, MD as PCP - General (Internal Medicine) Wyatt Portela, MD (Hematology and Oncology) Franchot Gallo, MD as Attending Physician (Urology) Gaynelle Arabian, MD as Consulting Physician (Orthopedic Surgery) Kristeen Miss, MD as Consulting Physician (Neurosurgery) Diamone Whistler Otho Darner, NP as Nurse Practitioner (Internal Medicine)  Extended Emergency Contact Information Primary Emergency Contact: [name],[name] Address: [address]          [place], [place] [address] [place] Phone: [phone] Work Phone: [phone] Mobile Phone: [phone] Relation: Daughter Secondary Emergency Contact: [place] Phone: [phone] Mobile Phone: [phone] Relation: Son  Code Status:  DNR Goals of care: Advanced Directive information Advanced Directives 10/22/2016  Does Patient Have a Medical Advance Directive? Yes  Type of [place] of [place];Living will;Out of facility DNR (pink MOST or yellow form)  Does patient want to make changes to medical advance directive? No - Patient declined  Copy of [place] in Chart? Yes  Would patient like information on creating a medical advance directive? -  Pre-existing out of facility DNR order (yellow form or pink MOST form) Yellow form placed in chart (order not valid for inpatient use)     Chief Complaint  Patient presents with  . Medical Management of Chronic Issues    HPI:  Pt is a 83 y.o. male seen today for medical management of chronic diseases.     Hx of prostate cancer, f/u Urology 10/11/16. GERD stable, on Famotine, memory is preserved on Namenda, multiple sites OA is managed with Tylenol hs, prn Lorazepam is available at bedtime for sleep.              Hospitalized 09/10/16 to  09/14/16 for unresponsive episode, UTI, AFib, HTN, tremor. CT, EEG, MRI brain was negative for acute intracranial process. Blood pressure is controlled on Lisinopril 20mg , hospital started Metoprolol for PSVT, Echo was unrevealing. TIAs on Plavix and statin Past Medical History:  Diagnosis Date  . Anxiety state, unspecified   . CAD (coronary artery disease) 11/20/13  . Carbon monoxide exposure 11/24/13  . Cervicalgia   . Degenerative disc disease   . Degenerative joint disease   . Diverticulitis   . Diverticulosis   . Ejection fraction   . Genital herpes   . Genital herpes 07/10/2013  . GERD (gastroesophageal reflux disease)   . Hip arthritis 09/29/2013   Bilateral  . Hypertension   . Hyponatremia 11/24/13  . Hypoxia 11/24/13  . Insomnia 07/09/2013  . Lumbago   . Lumbosacral spondylolysis   . Lumbosacral spondylosis without myelopathy 09/29/2013  . Malignant neoplasm of prostate ([facility])   . NSTEMI (non-ST elevated myocardial infarction) ([facility]) 11/20/13  . OA (osteoarthritis) of knee 06/21/2013  . Other and unspecified hyperlipidemia   . Pneumonia 11/24/13  . Postoperative anemia due to acute blood loss 06/22/2013  . Prostate cancer ([facility]) 2012   mets to bone  . TIA (transient ischemic attack)    Past Surgical History:  Procedure Laterality Date  . CATARACT EXTRACTION Bilateral   . MASTOIDECTOMY Bilateral   . TONSILLECTOMY    . TOTAL KNEE ARTHROPLASTY Right 06/21/2013   Procedure: RIGHT TOTAL KNEE ARTHROPLASTY;  Surgeon: Gearlean Alf, MD;  Location: [facility];  Service: Orthopedics;  Laterality: Right;. Dr. Wynelle Link    Allergies  Allergen Reactions  . Penicillins Other (See Comments)    Patient doesn't remember what kind of reaction  Has patient had a PCN reaction causing immediate rash, facial/tongue/throat swelling, SOB or lightheadedness with hypotension: n/a Has patient had a PCN reaction causing severe rash involving mucus membranes or skin necrosis:  N/a Has patient had a PCN  reaction that required hospitalization: n/a Has patient had a PCN reaction occurring within the last 10 years: n/a If all of the above answers are "NO", then may proceed with Cephalosporin use.     Allergies as of 10/22/2016      Reactions   Penicillins Other (See Comments)   Patient doesn't remember what kind of reaction Has patient had a PCN reaction causing immediate rash, facial/tongue/throat swelling, SOB or lightheadedness with hypotension: n/a Has patient had a PCN reaction causing severe rash involving mucus membranes or skin necrosis:  N/a Has patient had a PCN reaction that required hospitalization: n/a Has patient had a PCN reaction occurring within the last 10 years: n/a If all of the above answers are "NO", then may proceed with Cephalosporin use.      Medication List       Accurate as of 10/22/16  3:46 PM. Always use your most recent med list.          acetaminophen 500 MG tablet Commonly known as:  TYLENOL Take 1,000 mg by mouth at bedtime.   ALIGN 4 MG Caps Take 4 mg by mouth daily.   B-complex with vitamin C tablet Take 1 tablet by mouth every morning.   bicalutamide 50 MG tablet Commonly known as:  CASODEX Take 50 mg by mouth every morning.   chlorhexidine 0.12 % solution Commonly known as:  PERIDEX Use as directed 15 mLs in the mouth or throat daily as needed (mouth rinse).   clopidogrel 75 MG tablet Commonly known as:  PLAVIX Take 75 mg by mouth every morning.   famotidine 20 MG tablet Commonly known as:  PEPCID Take 20 mg by mouth daily. For heartburn   feeding supplement (ENSURE ENLIVE) Liqd Take 237 mLs by mouth 3 (three) times daily between meals.   lisinopril 40 MG tablet Commonly known as:  PRINIVIL,ZESTRIL Take 0.5 tablets (20 mg total) by mouth daily.   LORazepam 0.5 MG tablet Commonly known as:  ATIVAN Take 1 tablet (0.5 mg total) by mouth at bedtime as needed for sleep.   memantine 5 MG tablet Commonly known as:  NAMENDA Take  5 mg by mouth daily.   metoprolol tartrate 25 MG tablet Commonly known as:  LOPRESSOR Take 1 tablet (25 mg total) by mouth 2 (two) times daily.   polyethylene glycol packet Commonly known as:  MIRALAX / GLYCOLAX Take 17 g by mouth daily as needed.   PRESERVISION AREDS 2+MULTI VIT Caps Take 1 capsule by mouth 2 (two) times daily.   SYSTANE 0.4-0.3 % Soln Generic drug:  Polyethyl Glycol-Propyl Glycol Apply 1 drop to eye daily.       Review of Systems  Constitutional: Negative for activity change, appetite change, fatigue, fever and unexpected weight change.  HENT: Negative for congestion, ear pain, hearing loss, rhinorrhea, sore throat, tinnitus, trouble swallowing and voice change.   Eyes: Positive for visual disturbance (corrective lenses).       Corrective lenses  Respiratory: Negative for cough, choking, chest tightness, shortness of breath and wheezing.   Cardiovascular: Negative for chest pain, palpitations and leg swelling.       CAD with hx  MI mar 2015  Gastrointestinal: Negative for abdominal distention, abdominal pain, constipation, diarrhea and nausea.  Endocrine: Negative for cold intolerance, heat intolerance, polydipsia, polyphagia and polyuria.  Genitourinary: Positive for difficulty urinating and frequency. Negative for dysuria, testicular pain and urgency.       Hx prostate cancer. UTI with sepsis in jan 2018. Foley, dysuria  Musculoskeletal: Negative for arthralgias, back pain, gait problem, myalgias and neck pain.       SP right knee replacement  Skin: Negative for color change, pallor and rash.  Allergic/Immunologic: Negative.   Neurological: Negative for dizziness, tremors, syncope, speech difficulty, weakness, numbness and headaches.  Hematological: Negative for adenopathy. Does not bruise/bleed easily.  Psychiatric/Behavioral: Negative for behavioral problems, confusion, decreased concentration, hallucinations and sleep disturbance. The patient is not  nervous/anxious.     Immunization History  Administered Date(s) Administered  . Tdap 05/05/2013   Pertinent  Health Maintenance Due  Topic Date Due  . FOOT EXAM  06/28/1936  . OPHTHALMOLOGY EXAM  06/28/1936  . PNA vac Low Risk Adult (1 of 2 - PCV13) 06/28/1993  . HEMOGLOBIN A1C  12/23/2013  . INFLUENZA VACCINE  03/26/2016   No flowsheet data found. Functional Status Survey:    Vitals:   10/22/16 1200  BP: 124/73  Pulse: 60  Resp: 17  Temp: 98.5 F (36.9 C)  SpO2: 92%  Weight: 140 lb (63.5 kg)  Height: 5\' 9"  (1.753 m)   Body mass index is 20.67 kg/m. Physical Exam  Constitutional: He is oriented to person, place, and time. He appears well-developed and well-nourished. No distress.  HENT:  Right Ear: External ear normal.  Left Ear: External ear normal.  Nose: Nose normal.  Mouth/Throat: Oropharynx is clear and moist. No oropharyngeal exudate.  Eyes: Conjunctivae and EOM are normal. Pupils are equal, round, and reactive to light.  Neck: No JVD present. No tracheal deviation present. No thyromegaly present.  Cardiovascular: Normal rate, regular rhythm, normal heart sounds and intact distal pulses.  Exam reveals no gallop and no friction rub.   No murmur heard. Pulmonary/Chest: No respiratory distress. He has no wheezes. He has no rales. He exhibits no tenderness.  Abdominal: He exhibits no distension and no mass. There is no tenderness.  Genitourinary:  Genitourinary Comments: Foley  Musculoskeletal: Normal range of motion. He exhibits no edema or tenderness.  unstable on standiing  Lymphadenopathy:    He has no cervical adenopathy.  Neurological: He is alert and oriented to person, place, and time. He has normal reflexes. No cranial nerve deficit. Coordination normal.  Intention tremor in both arms  Skin: No rash noted. No erythema. No pallor.  Psychiatric: He has a normal mood and affect. His behavior is normal. Judgment and thought content normal.    Labs  reviewed:  Recent Labs  09/10/16 1325 09/10/16 1854 09/11/16 0030  NA 137 137 136  K 4.0 4.2 3.9  CL 102 98* 102  CO2 26 28 22   GLUCOSE 92 119* 97  BUN 25* 26* 26*  CREATININE 1.34* 1.20 1.23  CALCIUM 8.9 9.3 8.9    Recent Labs  09/10/16 1325 09/10/16 1854  AST 22 24  ALT 14* 16*  ALKPHOS 59 69  BILITOT 0.8 0.7  PROT 6.1* 7.2  ALBUMIN 3.6 4.1    Recent Labs  09/10/16 1325 09/10/16 1854 09/11/16 0030  WBC 12.2* 13.4* 12.2*  NEUTROABS 8.6* 9.4*  --   HGB 13.3 14.6 15.0  HCT 39.5 42.9 43.7  MCV 97.5 97.3 96.3  PLT 171 178 158   Lab Results  Component Value Date   TSH 1.947 09/13/2016   Lab Results  Component Value Date   HGBA1C 5.5 06/24/2013   Lab Results  Component Value Date   CHOL 132 11/22/2013   HDL 77 11/22/2013   LDLCALC 44 11/22/2013   TRIG 55 11/22/2013   CHOLHDL 1.7 11/22/2013    Significant Diagnostic Results in last 30 days:  No results found.  Assessment/Plan TIA (transient ischemic attack) Stable since admitted to SNF. Continue Plavix and Statin  Essential hypertension Controlled, continue Lisinopril and Metoprolol.   PSVT (paroxysmal supraventricular tachycardia) (HCC) Heart rate is in control, continue Metoprolol.   GERD (gastroesophageal reflux disease) Stable. Continue Famotidine.   OA (osteoarthritis) of knee Managed pain with Tylenol 1000mg  qhs  Prostate cancer [facility]) F/u Urology, last visited 10/11/16  Insomnia Prn Lorazepam at HS  Memory deficit Repetitive, continue Namenda.   Urinary tract infection UA c/s in am.      Family/ staff Communication: SNF  Labs/tests ordered:  UA C/S t

## 2016-10-22 NOTE — Assessment & Plan Note (Signed)
Repetitive, continue Namenda.  

## 2016-10-22 NOTE — Assessment & Plan Note (Addendum)
Stable. Continue Famotidine.  

## 2016-10-22 NOTE — Assessment & Plan Note (Signed)
Stable since admitted to SNF. Continue Plavix and Statin 

## 2016-10-22 NOTE — Assessment & Plan Note (Signed)
Prn Lorazepam at HS

## 2016-10-28 ENCOUNTER — Other Ambulatory Visit: Payer: Self-pay | Admitting: Urology

## 2016-10-28 DIAGNOSIS — C61 Malignant neoplasm of prostate: Secondary | ICD-10-CM

## 2016-11-05 ENCOUNTER — Non-Acute Institutional Stay (SKILLED_NURSING_FACILITY): Payer: Medicare Other | Admitting: Nurse Practitioner

## 2016-11-05 ENCOUNTER — Encounter: Payer: Self-pay | Admitting: Nurse Practitioner

## 2016-11-05 DIAGNOSIS — I471 Supraventricular tachycardia: Secondary | ICD-10-CM | POA: Diagnosis not present

## 2016-11-05 DIAGNOSIS — M1710 Unilateral primary osteoarthritis, unspecified knee: Secondary | ICD-10-CM | POA: Diagnosis not present

## 2016-11-05 DIAGNOSIS — I10 Essential (primary) hypertension: Secondary | ICD-10-CM | POA: Diagnosis not present

## 2016-11-05 DIAGNOSIS — J069 Acute upper respiratory infection, unspecified: Secondary | ICD-10-CM | POA: Diagnosis not present

## 2016-11-05 DIAGNOSIS — K219 Gastro-esophageal reflux disease without esophagitis: Secondary | ICD-10-CM

## 2016-11-05 DIAGNOSIS — R413 Other amnesia: Secondary | ICD-10-CM | POA: Diagnosis not present

## 2016-11-05 DIAGNOSIS — M179 Osteoarthritis of knee, unspecified: Secondary | ICD-10-CM

## 2016-11-05 DIAGNOSIS — J189 Pneumonia, unspecified organism: Secondary | ICD-10-CM | POA: Insufficient documentation

## 2016-11-05 DIAGNOSIS — G459 Transient cerebral ischemic attack, unspecified: Secondary | ICD-10-CM

## 2016-11-05 DIAGNOSIS — G4700 Insomnia, unspecified: Secondary | ICD-10-CM | POA: Diagnosis not present

## 2016-11-05 DIAGNOSIS — C61 Malignant neoplasm of prostate: Secondary | ICD-10-CM | POA: Diagnosis not present

## 2016-11-05 NOTE — Assessment & Plan Note (Signed)
non productive cough, nasal congestion, denied chest pain or palpitation, no O2 desaturation, he is afebrile. Obtain CXR to r/o PNA, Z-pack, observe.

## 2016-11-05 NOTE — Assessment & Plan Note (Signed)
Continue Lorazepam prn at night.

## 2016-11-05 NOTE — Assessment & Plan Note (Signed)
Heart rate is in control, continue Metoprolol  

## 2016-11-05 NOTE — Assessment & Plan Note (Signed)
Stable since admitted to SNF. Continue Plavix and Statin

## 2016-11-05 NOTE — Assessment & Plan Note (Signed)
Repetitive, continue Namenda.

## 2016-11-05 NOTE — Assessment & Plan Note (Signed)
Bone mets Treated with Firmagon since  10/22/2010 and subsequently Lupron on 11/27/2010 and has been on it since that time.  Casodex added on 01/06/2013. He started [place] in June of 2012 10/28/16 urology Lupron q 4 months, PSA testosterone bone scan Foley

## 2016-11-05 NOTE — Assessment & Plan Note (Signed)
Managed pain with Tylenol 1000mg  qhs

## 2016-11-05 NOTE — Assessment & Plan Note (Signed)
Stable. Continue Famotidine.

## 2016-11-05 NOTE — Progress Notes (Signed)
Location:  [facility] Room Number: 61 Place of Service:  SNF (31) Provider:  Sasha Rogel, Manxie  NP  [name], MD  Patient Care Team: Estill Dooms, MD as PCP - General (Internal Medicine) Wyatt Portela, MD (Hematology and Oncology) Franchot Gallo, MD as Attending Physician (Urology) Gaynelle Arabian, MD as Consulting Physician (Orthopedic Surgery) Kristeen Miss, MD as Consulting Physician (Neurosurgery) Riti Rollyson Otho Darner, NP as Nurse Practitioner (Internal Medicine)  Extended Emergency Contact Information Primary Emergency Contact: [name],[name] Address: [address]          [place], [place] [address] [place] Phone: [phone] Work Phone: [phone] Mobile Phone: [phone] Relation: Daughter Secondary Emergency Contact: [place] Phone: [phone] Mobile Phone: [phone] Relation: Son  Code Status:  DNR Goals of care: Advanced Directive information Advanced Directives 11/05/2016  Does Patient Have a Medical Advance Directive? Yes  Type of Paramedic of [place];Out of facility DNR (pink MOST or yellow form);Living will  Does patient want to make changes to medical advance directive? No - Patient declined  Copy of [place] in Chart? Yes  Would patient like information on creating a medical advance directive? -  Pre-existing out of facility DNR order (yellow form or pink MOST form) Yellow form placed in chart (order not valid for inpatient use)     Chief Complaint  Patient presents with  . Acute Visit    cough, congestion, runny nose    HPI:  Pt is a 83 y.o. male seen today for an acute visit for non productive cough, nasal congestion, denied chest pain or palpitation, no O2 desaturation, he is afebrile.   Hx of prostate cancer, f/u Urology 10/11/16. GERD stable, on Famotine, memory is preserved on Namenda, multiple sites OA is managed with Tylenol  hs, prn Lorazepam is available at bedtime for sleep. Afib, heart rate is in controlled, taking Metoprolol, HTN controlled on Lisinopril 20mg , Hx of TIAs on Plavix and statin   Past Medical History:  Diagnosis Date  . Anxiety state, unspecified   . CAD (coronary artery disease) 11/20/13  . Carbon monoxide exposure 11/24/13  . Cervicalgia   . Degenerative disc disease   . Degenerative joint disease   . Diverticulitis   . Diverticulosis   . Ejection fraction   . Genital herpes   . Genital herpes 07/10/2013  . GERD (gastroesophageal reflux disease)   . Hip arthritis 09/29/2013   Bilateral  . Hypertension   . Hyponatremia 11/24/13  . Hypoxia 11/24/13  . Insomnia 07/09/2013  . Lumbago   . Lumbosacral spondylolysis   . Lumbosacral spondylosis without myelopathy 09/29/2013  . Malignant neoplasm of prostate ([facility])   . NSTEMI (non-ST elevated myocardial infarction) ([facility]) 11/20/13  . OA (osteoarthritis) of knee 06/21/2013  . Other and unspecified hyperlipidemia   . Pneumonia 11/24/13  . Postoperative anemia due to acute blood loss 06/22/2013  . Prostate cancer ([facility]) 2012   mets to bone  . TIA (transient ischemic attack)    Past Surgical History:  Procedure Laterality Date  . CATARACT EXTRACTION Bilateral   . MASTOIDECTOMY Bilateral   . TONSILLECTOMY    . TOTAL KNEE ARTHROPLASTY Right 06/21/2013   Procedure: RIGHT TOTAL KNEE ARTHROPLASTY;  Surgeon: Gearlean Alf, MD;  Location: [facility];  Service: Orthopedics;  Laterality: Right;. Dr. Wynelle Link    Allergies  Allergen Reactions  . Penicillins Other (See Comments)    Patient doesn't remember what  kind of reaction  Has patient had a PCN reaction causing immediate rash, facial/tongue/throat swelling, SOB or lightheadedness with hypotension: n/a Has patient had a PCN reaction causing severe rash involving mucus membranes or skin necrosis:  N/a Has patient had a PCN reaction that required hospitalization: n/a Has patient had a PCN reaction occurring  within the last 10 years: n/a If all of the above answers are "NO", then may proceed with Cephalosporin use.     Allergies as of 11/05/2016      Reactions   Penicillins Other (See Comments)   Patient doesn't remember what kind of reaction Has patient had a PCN reaction causing immediate rash, facial/tongue/throat swelling, SOB or lightheadedness with hypotension: n/a Has patient had a PCN reaction causing severe rash involving mucus membranes or skin necrosis:  N/a Has patient had a PCN reaction that required hospitalization: n/a Has patient had a PCN reaction occurring within the last 10 years: n/a If all of the above answers are "NO", then may proceed with Cephalosporin use.      Medication List       Accurate as of 11/05/16 12:36 PM. Always use your most recent med list.          acetaminophen 500 MG tablet Commonly known as:  TYLENOL Take 1,000 mg by mouth at bedtime.   ALIGN 4 MG Caps Take 4 mg by mouth daily.   B-complex with vitamin C tablet Take 1 tablet by mouth every morning.   bicalutamide 50 MG tablet Commonly known as:  CASODEX Take 50 mg by mouth every morning.   chlorhexidine 0.12 % solution Commonly known as:  PERIDEX Use as directed 15 mLs in the mouth or throat daily as needed (mouth rinse).   clopidogrel 75 MG tablet Commonly known as:  PLAVIX Take 75 mg by mouth every morning.   famotidine 20 MG tablet Commonly known as:  PEPCID Take 20 mg by mouth daily. For heartburn   feeding supplement (ENSURE ENLIVE) Liqd Take 237 mLs by mouth 3 (three) times daily between meals.   lisinopril 40 MG tablet Commonly known as:  PRINIVIL,ZESTRIL Take 0.5 tablets (20 mg total) by mouth daily.   LORazepam 0.5 MG tablet Commonly known as:  ATIVAN Take 1 tablet (0.5 mg total) by mouth at bedtime as needed for sleep.   memantine 5 MG tablet Commonly known as:  NAMENDA Take 5 mg by mouth daily.   metoprolol tartrate 25 MG tablet Commonly known as:   LOPRESSOR Take 1 tablet (25 mg total) by mouth 2 (two) times daily.   polyethylene glycol packet Commonly known as:  MIRALAX / GLYCOLAX Take 17 g by mouth daily as needed.   PRESERVISION AREDS 2+MULTI VIT Caps Take 1 capsule by mouth 2 (two) times daily.   ROBAFEN DM 10-100 MG/5ML liquid Generic drug:  Dextromethorphan-Guaifenesin Take 10 mLs by mouth every 6 (six) hours as needed.   SYSTANE 0.4-0.3 % Soln Generic drug:  Polyethyl Glycol-Propyl Glycol Apply 1 drop to eye daily.       Review of Systems  Constitutional: Negative for activity change, appetite change, fatigue, fever and unexpected weight change.  HENT: Positive for congestion. Negative for ear pain, hearing loss, rhinorrhea, sore throat, tinnitus, trouble swallowing and voice change.   Eyes: Positive for visual disturbance (corrective lenses).       Corrective lenses  Respiratory: Positive for cough. Negative for choking, chest tightness, shortness of breath and wheezing.   Cardiovascular: Negative for chest pain, palpitations and leg  swelling.       CAD with hx MI mar 2015  Gastrointestinal: Negative for abdominal distention, abdominal pain, constipation, diarrhea and nausea.  Endocrine: Negative for cold intolerance, heat intolerance, polydipsia, polyphagia and polyuria.  Genitourinary: Positive for difficulty urinating and frequency. Negative for dysuria, testicular pain and urgency.       Hx prostate cancer. UTI with sepsis in jan 2018. Foley, dysuria  Musculoskeletal: Negative for arthralgias, back pain, gait problem, myalgias and neck pain.       SP right knee replacement  Skin: Negative for color change, pallor and rash.  Allergic/Immunologic: Negative.   Neurological: Negative for dizziness, tremors, syncope, speech difficulty, weakness, numbness and headaches.  Hematological: Negative for adenopathy. Does not bruise/bleed easily.  Psychiatric/Behavioral: Negative for behavioral problems, confusion,  decreased concentration, hallucinations and sleep disturbance. The patient is not nervous/anxious.     Immunization History  Administered Date(s) Administered  . Tdap 05/05/2013   Pertinent  Health Maintenance Due  Topic Date Due  . FOOT EXAM  06/28/1936  . OPHTHALMOLOGY EXAM  06/28/1936  . PNA vac Low Risk Adult (1 of 2 - PCV13) 06/28/1993  . HEMOGLOBIN A1C  12/23/2013  . INFLUENZA VACCINE  03/26/2016   No flowsheet data found. Functional Status Survey:    Vitals:   11/05/16 1126  BP: 133/74  Pulse: (!) 57  Resp: 18  Temp: 97.5 F (36.4 C)  SpO2: 95%  Weight: 142 lb 9.6 oz (64.7 kg)  Height: 5\' 9"  (1.753 m)   Body mass index is 21.06 kg/m. Physical Exam  Constitutional: He is oriented to person, place, and time. He appears well-developed and well-nourished. No distress.  HENT:  Right Ear: External ear normal.  Left Ear: External ear normal.  Nose: Nose normal.  Mouth/Throat: Oropharynx is clear and moist. No oropharyngeal exudate.  Eyes: Conjunctivae and EOM are normal. Pupils are equal, round, and reactive to light.  Neck: No JVD present. No tracheal deviation present. No thyromegaly present.  Cardiovascular: Normal rate, regular rhythm, normal heart sounds and intact distal pulses.  Exam reveals no gallop and no friction rub.   No murmur heard. Pulmonary/Chest: No respiratory distress. He has no wheezes. He has no rales. He exhibits no tenderness.  Abdominal: He exhibits no distension and no mass. There is no tenderness.  Genitourinary:  Genitourinary Comments: Foley  Musculoskeletal: Normal range of motion. He exhibits no edema or tenderness.  unstable on standiing  Lymphadenopathy:    He has no cervical adenopathy.  Neurological: He is alert and oriented to person, place, and time. He has normal reflexes. No cranial nerve deficit. Coordination normal.  Intention tremor in both arms  Skin: No rash noted. No erythema. No pallor.  Psychiatric: He has a normal  mood and affect. His behavior is normal. Judgment and thought content normal.    Labs reviewed:  Recent Labs  09/10/16 1325 09/10/16 1854 09/11/16 0030  NA 137 137 136  K 4.0 4.2 3.9  CL 102 98* 102  CO2 26 28 22   GLUCOSE 92 119* 97  BUN 25* 26* 26*  CREATININE 1.34* 1.20 1.23  CALCIUM 8.9 9.3 8.9    Recent Labs  09/10/16 1325 09/10/16 1854  AST 22 24  ALT 14* 16*  ALKPHOS 59 69  BILITOT 0.8 0.7  PROT 6.1* 7.2  ALBUMIN 3.6 4.1    Recent Labs  09/10/16 1325 09/10/16 1854 09/11/16 0030  WBC 12.2* 13.4* 12.2*  NEUTROABS 8.6* 9.4*  --   HGB 13.3  14.6 15.0  HCT 39.5 42.9 43.7  MCV 97.5 97.3 96.3  PLT 171 178 158   Lab Results  Component Value Date   TSH 1.947 09/13/2016   Lab Results  Component Value Date   HGBA1C 5.5 06/24/2013   Lab Results  Component Value Date   CHOL 132 11/22/2013   HDL 77 11/22/2013   LDLCALC 44 11/22/2013   TRIG 55 11/22/2013   CHOLHDL 1.7 11/22/2013    Significant Diagnostic Results in last 30 days:  No results found.  Assessment/Plan Upper respiratory infection non productive cough, nasal congestion, denied chest pain or palpitation, no O2 desaturation, he is afebrile. Obtain CXR to r/o PNA, Z-pack, observe.     TIA (transient ischemic attack) Stable since admitted to SNF. Continue Plavix and Statin  Essential hypertension Controlled, continue Lisinopril and Metoprolol.   PSVT (paroxysmal supraventricular tachycardia) (HCC) Heart rate is in control, continue Metoprolol.   GERD (gastroesophageal reflux disease) Stable. Continue Famotidine.   OA (osteoarthritis) of knee Managed pain with Tylenol 1000mg  qhs  Prostate cancer ([facility]) Bone mets Treated with Firmagon since  10/22/2010 and subsequently Lupron on 11/27/2010 and has been on it since that time.  Casodex added on 01/06/2013. He started Xgeva in June of 2012 10/28/16 urology Lupron q 4 months, PSA testosterone bone scan Foley   Insomnia Continue  Lorazepam prn at night.   Memory deficit Repetitive, continue Namenda.     Family/ staff Communication: IL when able.   Labs/tests ordered:  CXR AP

## 2016-11-05 NOTE — Assessment & Plan Note (Signed)
Controlled, continue Lisinopril and Metoprolol.

## 2016-11-08 ENCOUNTER — Non-Acute Institutional Stay (SKILLED_NURSING_FACILITY): Payer: Medicare Other | Admitting: Nurse Practitioner

## 2016-11-08 ENCOUNTER — Encounter: Payer: Self-pay | Admitting: Nurse Practitioner

## 2016-11-08 DIAGNOSIS — J209 Acute bronchitis, unspecified: Secondary | ICD-10-CM

## 2016-11-08 DIAGNOSIS — R413 Other amnesia: Secondary | ICD-10-CM | POA: Diagnosis not present

## 2016-11-08 DIAGNOSIS — I471 Supraventricular tachycardia, unspecified: Secondary | ICD-10-CM

## 2016-11-08 DIAGNOSIS — G4700 Insomnia, unspecified: Secondary | ICD-10-CM

## 2016-11-08 DIAGNOSIS — J440 Chronic obstructive pulmonary disease with acute lower respiratory infection: Secondary | ICD-10-CM | POA: Diagnosis not present

## 2016-11-08 DIAGNOSIS — C61 Malignant neoplasm of prostate: Secondary | ICD-10-CM | POA: Diagnosis not present

## 2016-11-08 DIAGNOSIS — R339 Retention of urine, unspecified: Secondary | ICD-10-CM | POA: Diagnosis not present

## 2016-11-08 DIAGNOSIS — M179 Osteoarthritis of knee, unspecified: Secondary | ICD-10-CM

## 2016-11-08 DIAGNOSIS — K219 Gastro-esophageal reflux disease without esophagitis: Secondary | ICD-10-CM | POA: Diagnosis not present

## 2016-11-08 DIAGNOSIS — I10 Essential (primary) hypertension: Secondary | ICD-10-CM

## 2016-11-08 DIAGNOSIS — M1710 Unilateral primary osteoarthritis, unspecified knee: Secondary | ICD-10-CM | POA: Diagnosis not present

## 2016-11-08 NOTE — Assessment & Plan Note (Signed)
Stable. Continue Famotidine.

## 2016-11-08 NOTE — Assessment & Plan Note (Signed)
Controlled, continue Lisinopril and Metoprolol.

## 2016-11-08 NOTE — Assessment & Plan Note (Signed)
09/24/16 urology Foley

## 2016-11-08 NOTE — Assessment & Plan Note (Signed)
Bone mets Treated with Firmagon since  10/22/2010 and subsequently Lupron on 11/27/2010 and has been on it since that time.  Casodex added on 01/06/2013. He started [place] in June of 2012 10/28/16 urology Lupron q 4 months, PSA testosterone bone scan Foley

## 2016-11-08 NOTE — Assessment & Plan Note (Signed)
11/05/16 CXR no focal infiltrates or pleural effusion.  Persisted malaise, fatigue, poor oral intake, diffused rhonchi, not responding to Z-pk, will try Avelox 400mg  po qd x 10days, DuoNeb tid x 5 days, Medrol dose pk, repeat CXR, update CBC CMP UA C/S

## 2016-11-08 NOTE — Assessment & Plan Note (Addendum)
Continue Lorazepam prn at night, used 6x in th past 14 days, effective, no noted AR so far, reevaluate in a month

## 2016-11-08 NOTE — Assessment & Plan Note (Signed)
Managed pain with Tylenol 1000mg  qhs

## 2016-11-08 NOTE — Assessment & Plan Note (Signed)
Repetitive, continue Namenda.

## 2016-11-08 NOTE — Assessment & Plan Note (Addendum)
Heart rate is in control, AP 88bpm, continue Metoprolol.

## 2016-11-08 NOTE — Progress Notes (Signed)
Location:  [facility] Room Number: 106 Place of Service:  SNF (31) Provider:  Mast, Manxie  NP  [name], MD  Patient Care Team: Estill Dooms, MD as PCP - General (Internal Medicine) Wyatt Portela, MD (Hematology and Oncology) Franchot Gallo, MD as Attending Physician (Urology) Gaynelle Arabian, MD as Consulting Physician (Orthopedic Surgery) Kristeen Miss, MD as Consulting Physician (Neurosurgery) Man Otho Darner, NP as Nurse Practitioner (Internal Medicine)  Extended Emergency Contact Information Primary Emergency Contact: [name],[name] Address: [address]          [place], [place] [address] [place] Phone: [phone] Work Phone: [phone] Mobile Phone: [phone] Relation: Daughter Secondary Emergency Contact: [place] Phone: [phone] Mobile Phone: [phone] Relation: Son  Code Status:  DNR Goals of care: Advanced Directive information Advanced Directives 11/08/2016  Does Patient Have a Medical Advance Directive? Yes  Type of Paramedic of [place];Out of facility DNR (pink MOST or yellow form)  Does patient want to make changes to medical advance directive? No - Patient declined  Copy of [place] in Chart? Yes  Would patient like information on creating a medical advance directive? -  Pre-existing out of facility DNR order (yellow form or pink MOST form) Yellow form placed in chart (order not valid for inpatient use)     Chief Complaint  Patient presents with  . Acute Visit    BP/HR elevated, Leaning to left, balance off    HPI:  Pt is a 84 y.o. male seen today for an acute visit for persisted cough, congestion, CXR was unremarkable, Z-pk has little efficacy, continued malaise, poor appetite, noted scattered Rhonchi R+L lungs, he is afebrile, but takes Tylenol routinely for OA pain. No O2 desaturation. No new focal weakness, HR 88bpm.  Needs to monitor BP    Hx of prostate cancer, f/u Urology 10/11/16. GERD stable, on Famotine, memory is preserved on Namenda, multiple sites OA is managed with Tylenol hs, prn Lorazepam is available at bedtime for sleep. Afib, heart rate is in controlled, taking Metoprolol, HTN controlled on Lisinopril 20mg , Hx of TIAs on Plavix and statin                Past Medical History:  Diagnosis Date  . Anxiety state, unspecified   . CAD (coronary artery disease) 11/20/13  . Carbon monoxide exposure 11/24/13  . Cervicalgia   . Degenerative disc disease   . Degenerative joint disease   . Diverticulitis   . Diverticulosis   . Ejection fraction   . Genital herpes   . Genital herpes 07/10/2013  . GERD (gastroesophageal reflux disease)   . Hip arthritis 09/29/2013   Bilateral  . Hypertension   . Hyponatremia 11/24/13  . Hypoxia 11/24/13  . Insomnia 07/09/2013  . Lumbago   . Lumbosacral spondylolysis   . Lumbosacral spondylosis without myelopathy 09/29/2013  . Malignant neoplasm of prostate ([place])   . NSTEMI (non-ST elevated myocardial infarction) ([facility]) 11/20/13  . OA (osteoarthritis) of knee 06/21/2013  . Other and unspecified hyperlipidemia   . Pneumonia 11/24/13  . Postoperative anemia due to acute blood loss 06/22/2013  . Prostate cancer ([facility]) 2012   mets to bone  . TIA (transient ischemic attack)    Past Surgical History:  Procedure Laterality Date  . CATARACT EXTRACTION Bilateral   . MASTOIDECTOMY Bilateral   . TONSILLECTOMY    . TOTAL KNEE ARTHROPLASTY Right 06/21/2013   Procedure:  RIGHT TOTAL KNEE ARTHROPLASTY;  Surgeon: Gearlean Alf, MD;  Location: [facility];  Service: Orthopedics;  Laterality: Right;. Dr. Wynelle Link    Allergies  Allergen Reactions  . Penicillins Other (See Comments)    Patient doesn't remember what kind of reaction  Has patient had a PCN reaction causing immediate rash, facial/tongue/throat swelling, SOB or lightheadedness with hypotension: n/a Has patient had a PCN  reaction causing severe rash involving mucus membranes or skin necrosis:  N/a Has patient had a PCN reaction that required hospitalization: n/a Has patient had a PCN reaction occurring within the last 10 years: n/a If all of the above answers are "NO", then may proceed with Cephalosporin use.     Allergies as of 11/08/2016      Reactions   Penicillins Other (See Comments)   Patient doesn't remember what kind of reaction Has patient had a PCN reaction causing immediate rash, facial/tongue/throat swelling, SOB or lightheadedness with hypotension: n/a Has patient had a PCN reaction causing severe rash involving mucus membranes or skin necrosis:  N/a Has patient had a PCN reaction that required hospitalization: n/a Has patient had a PCN reaction occurring within the last 10 years: n/a If all of the above answers are "NO", then may proceed with Cephalosporin use.      Medication List       Accurate as of 11/08/16  3:49 PM. Always use your most recent med list.          acetaminophen 500 MG tablet Commonly known as:  TYLENOL Take 1,000 mg by mouth at bedtime.   ALIGN 4 MG Caps Take 4 mg by mouth daily.   azithromycin 250 MG tablet Commonly known as:  ZITHROMAX Take 250 mg by mouth daily.   B-complex with vitamin C tablet Take 1 tablet by mouth every morning.   bicalutamide 50 MG tablet Commonly known as:  CASODEX Take 50 mg by mouth every morning.   chlorhexidine 0.12 % solution Commonly known as:  PERIDEX Use as directed 15 mLs in the mouth or throat daily as needed (mouth rinse).   clopidogrel 75 MG tablet Commonly known as:  PLAVIX Take 75 mg by mouth every morning.   famotidine 20 MG tablet Commonly known as:  PEPCID Take 20 mg by mouth daily. For heartburn   feeding supplement (ENSURE ENLIVE) Liqd Take 237 mLs by mouth 3 (three) times daily between meals.   lisinopril 40 MG tablet Commonly known as:  PRINIVIL,ZESTRIL Take 0.5 tablets (20 mg total) by mouth  daily.   LORazepam 0.5 MG tablet Commonly known as:  ATIVAN Take 1 tablet (0.5 mg total) by mouth at bedtime as needed for sleep.   memantine 5 MG tablet Commonly known as:  NAMENDA Take 5 mg by mouth daily.   metoprolol tartrate 25 MG tablet Commonly known as:  LOPRESSOR Take 1 tablet (25 mg total) by mouth 2 (two) times daily.   polyethylene glycol packet Commonly known as:  MIRALAX / GLYCOLAX Take 17 g by mouth daily as needed.   saccharomyces boulardii 250 MG capsule Commonly known as:  FLORASTOR Take 250 mg by mouth 2 (two) times daily.   SYSTANE 0.4-0.3 % Soln Generic drug:  Polyethyl Glycol-Propyl Glycol Apply 1 drop to eye daily.       Review of Systems  Constitutional: Positive for activity change, appetite change and fatigue. Negative for fever and unexpected weight change.  HENT: Positive for congestion. Negative for ear pain, hearing loss, rhinorrhea, sore throat, tinnitus, trouble  swallowing and voice change.   Eyes: Positive for visual disturbance (corrective lenses).       Corrective lenses  Respiratory: Positive for cough. Negative for choking, chest tightness, shortness of breath and wheezing.   Cardiovascular: Negative for chest pain, palpitations and leg swelling.       CAD with hx MI mar 2015  Gastrointestinal: Negative for abdominal distention, abdominal pain, constipation, diarrhea and nausea.  Endocrine: Negative for cold intolerance, heat intolerance, polydipsia, polyphagia and polyuria.  Genitourinary: Positive for difficulty urinating and frequency. Negative for dysuria, testicular pain and urgency.       Hx prostate cancer. UTI with sepsis in jan 2018. Foley, dysuria  Musculoskeletal: Negative for arthralgias, back pain, gait problem, myalgias and neck pain.       SP right knee replacement  Skin: Negative for color change, pallor and rash.  Allergic/Immunologic: Negative.   Neurological: Negative for dizziness, tremors, syncope, speech  difficulty, weakness, numbness and headaches.  Hematological: Negative for adenopathy. Does not bruise/bleed easily.  Psychiatric/Behavioral: Negative for behavioral problems, confusion, decreased concentration, hallucinations and sleep disturbance. The patient is not nervous/anxious.     Immunization History  Administered Date(s) Administered  . Tdap 05/05/2013   Pertinent  Health Maintenance Due  Topic Date Due  . FOOT EXAM  06/28/1936  . OPHTHALMOLOGY EXAM  06/28/1936  . PNA vac Low Risk Adult (1 of 2 - PCV13) 06/28/1993  . HEMOGLOBIN A1C  12/23/2013  . INFLUENZA VACCINE  03/26/2016   No flowsheet data found. Functional Status Survey:    Vitals:   11/08/16 1330  BP: 103/62  Pulse: 60  Resp: 20  Temp: 97.6 F (36.4 C)  SpO2: 95%  Weight: 142 lb 9.6 oz (64.7 kg)  Height: 5\' 9"  (1.753 m)   Body mass index is 21.06 kg/m. Physical Exam  Constitutional: He is oriented to person, place, and time. He appears well-developed and well-nourished. No distress.  HENT:  Right Ear: External ear normal.  Left Ear: External ear normal.  Nose: Nose normal.  Mouth/Throat: Oropharynx is clear and moist. No oropharyngeal exudate.  Eyes: Conjunctivae and EOM are normal. Pupils are equal, round, and reactive to light.  Neck: No JVD present. No tracheal deviation present. No thyromegaly present.  Cardiovascular: Normal rate, regular rhythm, normal heart sounds and intact distal pulses.  Exam reveals no gallop and no friction rub.   No murmur heard. Pulmonary/Chest: No respiratory distress. He has no wheezes. He has no rales. He exhibits no tenderness.  Scattered coarse rhonchi R+L lungs  Abdominal: He exhibits no distension and no mass. There is no tenderness.  Genitourinary:  Genitourinary Comments: Foley  Musculoskeletal: Normal range of motion. He exhibits no edema or tenderness.  unstable on standiing  Lymphadenopathy:    He has no cervical adenopathy.  Neurological: He is alert  and oriented to person, place, and time. He has normal reflexes. No cranial nerve deficit. Coordination normal.  Intention tremor in both arms  Skin: No rash noted. No erythema. No pallor.  Psychiatric: He has a normal mood and affect. His behavior is normal. Judgment and thought content normal.    Labs reviewed:  Recent Labs  09/10/16 1325 09/10/16 1854 09/11/16 0030  NA 137 137 136  K 4.0 4.2 3.9  CL 102 98* 102  CO2 26 28 22   GLUCOSE 92 119* 97  BUN 25* 26* 26*  CREATININE 1.34* 1.20 1.23  CALCIUM 8.9 9.3 8.9    Recent Labs  09/10/16 1325 09/10/16 1854  AST 22 24  ALT 14* 16*  ALKPHOS 59 69  BILITOT 0.8 0.7  PROT 6.1* 7.2  ALBUMIN 3.6 4.1    Recent Labs  09/10/16 1325 09/10/16 1854 09/11/16 0030  WBC 12.2* 13.4* 12.2*  NEUTROABS 8.6* 9.4*  --   HGB 13.3 14.6 15.0  HCT 39.5 42.9 43.7  MCV 97.5 97.3 96.3  PLT 171 178 158   Lab Results  Component Value Date   TSH 1.947 09/13/2016   Lab Results  Component Value Date   HGBA1C 5.5 06/24/2013   Lab Results  Component Value Date   CHOL 132 11/22/2013   HDL 77 11/22/2013   LDLCALC 44 11/22/2013   TRIG 55 11/22/2013   CHOLHDL 1.7 11/22/2013    Significant Diagnostic Results in last 30 days:  No results found.  Assessment/Plan Acute bronchitis with COPD ([place]) 11/05/16 CXR no focal infiltrates or pleural effusion.  Persisted malaise, fatigue, poor oral intake, diffused rhonchi, not responding to Z-pk, will try Avelox 400mg  po qd x 10days, DuoNeb tid x 5 days, Medrol dose pk, repeat CXR, update CBC CMP UA C/S  Essential hypertension Controlled, continue Lisinopril and Metoprolol.   PSVT (paroxysmal supraventricular tachycardia) (H[facility]) Heart rate is in control, AP 88bpm, continue Metoprolol.   GERD (gastroesophageal reflux disease) Stable. Continue Famotidine.   OA (osteoarthritis) of knee Managed pain with Tylenol 1000mg  qhs  Prostate cancer ([facility]) Bone mets Treated with Firmagon since   10/22/2010 and subsequently Lupron on 11/27/2010 and has been on it since that time.  Casodex added on 01/06/2013. He started Xgeva in June of 2012 10/28/16 urology Lupron q 4 months, PSA testosterone bone scan Foley   Urinary retention 09/24/16 urology Foley  Insomnia Continue Lorazepam prn at night, used 6x in th past 14 days, effective, no noted AR so far, reevaluate in a month  Memory deficit Repetitive, continue Namenda.       Family/ staff Communication: IL when able.   Labs/tests ordered:  Repeat CXR, obtain CBC CMP UA C/S

## 2016-11-09 LAB — HEPATIC FUNCTION PANEL
ALT: 23 U/L (ref 10–40)
AST: 34 U/L (ref 14–40)
Alkaline Phosphatase: 75 U/L (ref 25–125)
BILIRUBIN, TOTAL: 0.5 mg/dL

## 2016-11-09 LAB — BASIC METABOLIC PANEL
BUN: 30 mg/dL — AB (ref 4–21)
CREATININE: 1.1 mg/dL (ref <=1.3)
GLUCOSE: 119 mg/dL
POTASSIUM: 3.9 mmol/L (ref 3.4–5.3)
Sodium: 137 mmol/L (ref 137–147)

## 2016-11-11 ENCOUNTER — Encounter: Payer: Self-pay | Admitting: Internal Medicine

## 2016-11-11 ENCOUNTER — Non-Acute Institutional Stay (SKILLED_NURSING_FACILITY): Payer: Medicare Other | Admitting: Internal Medicine

## 2016-11-11 DIAGNOSIS — I10 Essential (primary) hypertension: Secondary | ICD-10-CM

## 2016-11-11 DIAGNOSIS — J209 Acute bronchitis, unspecified: Secondary | ICD-10-CM

## 2016-11-11 DIAGNOSIS — J440 Chronic obstructive pulmonary disease with acute lower respiratory infection: Secondary | ICD-10-CM | POA: Diagnosis not present

## 2016-11-11 LAB — TSH: TSH: 1.12 u[IU]/mL (ref <=5.90)

## 2016-11-11 NOTE — Progress Notes (Signed)
Progress Note    Location:  [facility] Room Number: N39 Place of Service:  SNF [phone]) Provider:  [name], MD  Patient Care Team: Estill Dooms, MD as PCP - General (Internal Medicine) Wyatt Portela, MD (Hematology and Oncology) Franchot Gallo, MD as Attending Physician (Urology) Gaynelle Arabian, MD as Consulting Physician (Orthopedic Surgery) Kristeen Miss, MD as Consulting Physician (Neurosurgery) Man Otho Darner, NP as Nurse Practitioner (Internal Medicine)  Extended Emergency Contact Information Primary Emergency Contact: [name],[name] Address: [address]          [place], [place] [address] [place] Phone: [phone] Work Phone: [phone] Mobile Phone: [phone] Relation: Daughter Secondary Emergency Contact: [place] Phone: [phone] Mobile Phone: [phone] Relation: Son  Code Status:  DNR Goals of care: Advanced Directive information Advanced Directives 11/11/2016  Does Patient Have a Medical Advance Directive? Yes  Type of Paramedic of [place];Living will;Out of facility DNR (pink MOST or yellow form)  Does patient want to make changes to medical advance directive? -  Copy of [place] in Chart? Yes  Would patient like information on creating a medical advance directive? -  Pre-existing out of facility DNR order (yellow form or pink MOST form) Yellow form placed in chart (order not valid for inpatient use)     Chief Complaint  Patient presents with  . Acute Visit    bronchitis    HPI:  Pt is a 83 y.o. male seen today for an acute visit for acute bronchitis. Initially treated with Zpak, but it did not seem to help. Llast week started on 11/08/16 on Avelox 400 mg qd x 10 days . Still with cough and sputum. Denies chest pain.    Past Medical History:  Diagnosis Date  . Anxiety state, unspecified   . CAD (coronary artery  disease) 11/20/13  . Carbon monoxide exposure 11/24/13  . Cervicalgia   . Degenerative disc disease   . Degenerative joint disease   . Diverticulitis   . Diverticulosis   . Ejection fraction   . Genital herpes   . Genital herpes 07/10/2013  . GERD (gastroesophageal reflux disease)   . Hip arthritis 09/29/2013   Bilateral  . Hypertension   . Hyponatremia 11/24/13  . Hypoxia 11/24/13  . Insomnia 07/09/2013  . Lumbago   . Lumbosacral spondylolysis   . Lumbosacral spondylosis without myelopathy 09/29/2013  . Malignant neoplasm of prostate ([facility])   . NSTEMI (non-ST elevated myocardial infarction) ([facility]) 11/20/13  . OA (osteoarthritis) of knee 06/21/2013  . Other and unspecified hyperlipidemia   . Pneumonia 11/24/13  . Postoperative anemia due to acute blood loss 06/22/2013  . Prostate cancer ([facility]) 2012   mets to bone  . TIA (transient ischemic attack)    Past Surgical History:  Procedure Laterality Date  . CATARACT EXTRACTION Bilateral   . MASTOIDECTOMY Bilateral   . TONSILLECTOMY    . TOTAL KNEE ARTHROPLASTY Right 06/21/2013   Procedure: RIGHT TOTAL KNEE ARTHROPLASTY;  Surgeon: Gearlean Alf, MD;  Location: [facility];  Service: Orthopedics;  Laterality: Right;. Dr. Wynelle Link    Allergies  Allergen Reactions  . Penicillins Other (See Comments)    Patient doesn't remember what kind of reaction  Has patient had a PCN reaction causing immediate rash, facial/tongue/throat swelling, SOB or lightheadedness with hypotension: n/a Has patient had a PCN reaction causing severe rash involving mucus membranes or skin necrosis:  N/a Has  patient had a PCN reaction that required hospitalization: n/a Has patient had a PCN reaction occurring within the last 10 years: n/a If all of the above answers are "NO", then may proceed with Cephalosporin use.     Allergies as of 11/11/2016      Reactions   Penicillins Other (See Comments)   Patient doesn't remember what kind of reaction Has patient had a PCN  reaction causing immediate rash, facial/tongue/throat swelling, SOB or lightheadedness with hypotension: n/a Has patient had a PCN reaction causing severe rash involving mucus membranes or skin necrosis:  N/a Has patient had a PCN reaction that required hospitalization: n/a Has patient had a PCN reaction occurring within the last 10 years: n/a If all of the above answers are "NO", then may proceed with Cephalosporin use.      Medication List       Accurate as of 11/11/16  3:57 PM. Always use your most recent med list.          acetaminophen 500 MG tablet Commonly known as:  TYLENOL Take 1,000 mg by mouth at bedtime.   ALIGN 4 MG Caps Take 4 mg by mouth daily.   B-complex with vitamin C tablet Take 1 tablet by mouth every morning.   bicalutamide 50 MG tablet Commonly known as:  CASODEX Take 50 mg by mouth every morning.   chlorhexidine 0.12 % solution Commonly known as:  PERIDEX Use as directed 15 mLs in the mouth or throat daily as needed (mouth rinse).   clopidogrel 75 MG tablet Commonly known as:  PLAVIX Take 75 mg by mouth every morning.   famotidine 20 MG tablet Commonly known as:  PEPCID Take 20 mg by mouth daily. For heartburn   feeding supplement (ENSURE ENLIVE) Liqd Take 237 mLs by mouth 3 (three) times daily between meals.   ipratropium-albuterol 0.5-2.5 (3) MG/3ML Soln Commonly known as:  DUONEB Take 3 mLs by nebulization 3 (three) times daily.   lisinopril 40 MG tablet Commonly known as:  PRINIVIL,ZESTRIL Take 0.5 tablets (20 mg total) by mouth daily.   LORazepam 0.5 MG tablet Commonly known as:  ATIVAN Take 1 tablet (0.5 mg total) by mouth at bedtime as needed for sleep.   memantine 5 MG tablet Commonly known as:  NAMENDA Take 5 mg by mouth daily.   metoprolol tartrate 25 MG tablet Commonly known as:  LOPRESSOR Take 1 tablet (25 mg total) by mouth 2 (two) times daily.   moxifloxacin 400 MG tablet Commonly known as:  AVELOX Take 400 mg by  mouth daily at 8 pm.   polyethylene glycol packet Commonly known as:  MIRALAX / GLYCOLAX Take 17 g by mouth daily as needed.   PRESERVISION AREDS 2+MULTI VIT Caps Take by mouth. Take one tablet twice daily   saccharomyces boulardii 250 MG capsule Commonly known as:  FLORASTOR Take 250 mg by mouth 2 (two) times daily.   SYSTANE 0.4-0.3 % Soln Generic drug:  Polyethyl Glycol-Propyl Glycol Apply 1 drop to eye daily.   zinc oxide 20 % ointment Apply 1 application topically. Apply to buttocks three times daily as needed for redness       Review of Systems  Constitutional: Positive for activity change, appetite change and fatigue. Negative for fever and unexpected weight change.  HENT: Positive for congestion. Negative for ear pain, hearing loss, rhinorrhea, sore throat, tinnitus, trouble swallowing and voice change.   Eyes: Positive for visual disturbance (corrective lenses).       Corrective lenses  Respiratory: Positive for cough. Negative for choking, chest tightness, shortness of breath and wheezing.   Cardiovascular: Negative for chest pain, palpitations and leg swelling.       CAD with hx MI mar 2015  Gastrointestinal: Negative for abdominal distention, abdominal pain, constipation, diarrhea and nausea.  Endocrine: Negative for cold intolerance, heat intolerance, polydipsia, polyphagia and polyuria.  Genitourinary: Positive for difficulty urinating and frequency. Negative for dysuria, testicular pain and urgency.       Hx prostate cancer. UTI with sepsis in jan 2018. Foley, dysuria  Musculoskeletal: Negative for arthralgias, back pain, gait problem, myalgias and neck pain.       SP right knee replacement  Skin: Negative for color change, pallor and rash.  Allergic/Immunologic: Negative.   Neurological: Negative for dizziness, tremors, syncope, speech difficulty, weakness, numbness and headaches.  Hematological: Negative for adenopathy. Does not bruise/bleed easily.    Psychiatric/Behavioral: Negative for behavioral problems, confusion, decreased concentration, hallucinations and sleep disturbance. The patient is not nervous/anxious.     Immunization History  Administered Date(s) Administered  . Tdap 05/05/2013   Pertinent  Health Maintenance Due  Topic Date Due  . FOOT EXAM  06/28/1936  . OPHTHALMOLOGY EXAM  06/28/1936  . PNA vac Low Risk Adult (1 of 2 - PCV13) 06/28/1993  . HEMOGLOBIN A1C  12/23/2013  . INFLUENZA VACCINE  03/26/2016    Vitals:   11/11/16 1545  BP: (!) 89/59  Pulse: (!) 58  Resp: 16  Temp: 98.4 F (36.9 C)  SpO2: 93%  Weight: 142 lb 9.6 oz (64.7 kg)  Height: 5\' 9"  (1.753 m)   Body mass index is 21.06 kg/m. Physical Exam  Constitutional: He is oriented to person, place, and time. He appears well-developed and well-nourished. No distress.  HENT:  Right Ear: External ear normal.  Left Ear: External ear normal.  Nose: Nose normal.  Mouth/Throat: Oropharynx is clear and moist. No oropharyngeal exudate.  Eyes: Conjunctivae and EOM are normal. Pupils are equal, round, and reactive to light.  Neck: No JVD present. No tracheal deviation present. No thyromegaly present.  Cardiovascular: Normal rate, regular rhythm, normal heart sounds and intact distal pulses.  Exam reveals no gallop and no friction rub.   No murmur heard. Pulmonary/Chest: No respiratory distress. He has no wheezes. He has rales. He exhibits no tenderness.  Scattered coarse rhonchi R+L lungs  Abdominal: He exhibits no distension and no mass. There is no tenderness.  Genitourinary:  Genitourinary Comments: Foley  Musculoskeletal: Normal range of motion. He exhibits no edema or tenderness.  unstable on standiing  Lymphadenopathy:    He has no cervical adenopathy.  Neurological: He is alert and oriented to person, place, and time. He has normal reflexes. No cranial nerve deficit. Coordination normal.  Intention tremor in both arms  Skin: No rash noted. No  erythema. No pallor.  Psychiatric: He has a normal mood and affect. His behavior is normal. Judgment and thought content normal.    Labs reviewed:  Recent Labs  09/10/16 1325 09/10/16 1854 09/11/16 0030  NA 137 137 136  K 4.0 4.2 3.9  CL 102 98* 102  CO2 26 28 22   GLUCOSE 92 119* 97  BUN 25* 26* 26*  CREATININE 1.34* 1.20 1.23  CALCIUM 8.9 9.3 8.9    Recent Labs  09/10/16 1325 09/10/16 1854  AST 22 24  ALT 14* 16*  ALKPHOS 59 69  BILITOT 0.8 0.7  PROT 6.1* 7.2  ALBUMIN 3.6 4.1    Recent Labs  09/10/16 1325 09/10/16 1854 09/11/16 0030  WBC 12.2* 13.4* 12.2*  NEUTROABS 8.6* 9.4*  --   HGB 13.3 14.6 15.0  HCT 39.5 42.9 43.7  MCV 97.5 97.3 96.3  PLT 171 178 158   Lab Results  Component Value Date   TSH 1.947 09/13/2016   Lab Results  Component Value Date   HGBA1C 5.5 06/24/2013   Lab Results  Component Value Date   CHOL 132 11/22/2013   HDL 77 11/22/2013   LDLCALC 44 11/22/2013   TRIG 55 11/22/2013   CHOLHDL 1.7 11/22/2013    Assessment/Plan 1. Acute bronchitis with COPD ([place]) Continue current meds. He thinks he is starting to get better.  2. Essential hypertension Controlled. BP is running on the low side today.

## 2016-11-12 ENCOUNTER — Encounter: Payer: Self-pay | Admitting: Nurse Practitioner

## 2016-11-12 ENCOUNTER — Other Ambulatory Visit: Payer: Self-pay | Admitting: *Deleted

## 2016-11-15 ENCOUNTER — Encounter (HOSPITAL_COMMUNITY): Payer: Medicare Other

## 2016-11-15 LAB — BASIC METABOLIC PANEL
BUN: 32 mg/dL — AB (ref 4–21)
CREATININE: 1.4 mg/dL — AB (ref <=1.3)
Glucose: 89 mg/dL
POTASSIUM: 5 mmol/L (ref 3.4–5.3)
Sodium: 137 mmol/L (ref 137–147)

## 2016-11-19 ENCOUNTER — Encounter: Payer: Self-pay | Admitting: Nurse Practitioner

## 2016-11-19 ENCOUNTER — Non-Acute Institutional Stay (SKILLED_NURSING_FACILITY): Payer: Medicare Other | Admitting: Nurse Practitioner

## 2016-11-19 ENCOUNTER — Encounter (HOSPITAL_COMMUNITY): Payer: Self-pay

## 2016-11-19 ENCOUNTER — Emergency Department (HOSPITAL_COMMUNITY): Payer: Medicare Other

## 2016-11-19 ENCOUNTER — Emergency Department (HOSPITAL_COMMUNITY)
Admission: EM | Admit: 2016-11-19 | Discharge: 2016-11-19 | Disposition: A | Discharge status: Home / Self Care | Payer: Medicare Other | Attending: Emergency Medicine | Admitting: Emergency Medicine

## 2016-11-19 ENCOUNTER — Other Ambulatory Visit: Payer: Self-pay | Admitting: *Deleted

## 2016-11-19 DIAGNOSIS — Y939 Activity, unspecified: Secondary | ICD-10-CM | POA: Diagnosis not present

## 2016-11-19 DIAGNOSIS — R413 Other amnesia: Secondary | ICD-10-CM

## 2016-11-19 DIAGNOSIS — Z8546 Personal history of malignant neoplasm of prostate: Secondary | ICD-10-CM | POA: Insufficient documentation

## 2016-11-19 DIAGNOSIS — K219 Gastro-esophageal reflux disease without esophagitis: Secondary | ICD-10-CM

## 2016-11-19 DIAGNOSIS — I10 Essential (primary) hypertension: Secondary | ICD-10-CM

## 2016-11-19 DIAGNOSIS — J690 Pneumonitis due to inhalation of food and vomit: Secondary | ICD-10-CM | POA: Diagnosis not present

## 2016-11-19 DIAGNOSIS — N390 Urinary tract infection, site not specified: Secondary | ICD-10-CM | POA: Diagnosis not present

## 2016-11-19 DIAGNOSIS — I2510 Atherosclerotic heart disease of native coronary artery without angina pectoris: Secondary | ICD-10-CM | POA: Insufficient documentation

## 2016-11-19 DIAGNOSIS — Z96651 Presence of right artificial knee joint: Secondary | ICD-10-CM | POA: Diagnosis not present

## 2016-11-19 DIAGNOSIS — C61 Malignant neoplasm of prostate: Secondary | ICD-10-CM | POA: Diagnosis not present

## 2016-11-19 DIAGNOSIS — Z8673 Personal history of transient ischemic attack (TIA), and cerebral infarction without residual deficits: Secondary | ICD-10-CM | POA: Diagnosis not present

## 2016-11-19 DIAGNOSIS — R339 Retention of urine, unspecified: Secondary | ICD-10-CM | POA: Diagnosis not present

## 2016-11-19 DIAGNOSIS — Z87891 Personal history of nicotine dependence: Secondary | ICD-10-CM | POA: Diagnosis not present

## 2016-11-19 DIAGNOSIS — W19XXXA Unspecified fall, initial encounter: Secondary | ICD-10-CM | POA: Insufficient documentation

## 2016-11-19 DIAGNOSIS — G4700 Insomnia, unspecified: Secondary | ICD-10-CM | POA: Diagnosis not present

## 2016-11-19 DIAGNOSIS — R918 Other nonspecific abnormal finding of lung field: Secondary | ICD-10-CM

## 2016-11-19 DIAGNOSIS — I252 Old myocardial infarction: Secondary | ICD-10-CM | POA: Diagnosis not present

## 2016-11-19 DIAGNOSIS — Y999 Unspecified external cause status: Secondary | ICD-10-CM | POA: Diagnosis not present

## 2016-11-19 DIAGNOSIS — R0781 Pleurodynia: Secondary | ICD-10-CM | POA: Diagnosis present

## 2016-11-19 DIAGNOSIS — Y92129 Unspecified place in nursing home as the place of occurrence of the external cause: Secondary | ICD-10-CM | POA: Diagnosis not present

## 2016-11-19 LAB — HEPATIC FUNCTION PANEL
ALK PHOS: 74 U/L (ref 25–125)
ALT: 24 U/L (ref 10–40)
AST: 27 U/L (ref 14–40)
BILIRUBIN, TOTAL: 0.6 mg/dL

## 2016-11-19 LAB — BASIC METABOLIC PANEL
BUN: 29 mg/dL — AB (ref 4–21)
CREATININE: 1.4 mg/dL — AB (ref <=1.3)
Glucose: 86 mg/dL
Potassium: 4.3 mmol/L (ref 3.4–5.3)
Sodium: 136 mmol/L — AB (ref 137–147)

## 2016-11-19 NOTE — Assessment & Plan Note (Signed)
Repetitive, continue Namenda.

## 2016-11-19 NOTE — Assessment & Plan Note (Signed)
09/24/16 urology Foley

## 2016-11-19 NOTE — Progress Notes (Signed)
Location:  [facility] Room Number: 23 Place of Service:  SNF (31) Provider:  Albeiro Trompeter, Manxie  NP  [name], MD  Patient Care Team: Estill Dooms, MD as PCP - General (Internal Medicine) Wyatt Portela, MD (Hematology and Oncology) Franchot Gallo, MD as Attending Physician (Urology) Gaynelle Arabian, MD as Consulting Physician (Orthopedic Surgery) Kristeen Miss, MD as Consulting Physician (Neurosurgery) Fernando Torry Otho Darner, NP as Nurse Practitioner (Internal Medicine)  Extended Emergency Contact Information Primary Emergency Contact: [name],[name] Address: [address]          [place], [place] [address] [place] Phone: [phone] Work Phone: [phone] Mobile Phone: [phone] Relation: Daughter Secondary Emergency Contact: [place] Phone: [phone] Mobile Phone: [phone] Relation: Son  Code Status:  DNR Goals of care: Advanced Directive information Advanced Directives 11/19/2016  Does Patient Have a Medical Advance Directive? Yes  Type of Advance Directive Out of facility DNR (pink MOST or yellow form)  Does patient want to make changes to medical advance directive? No - Patient declined  Copy of [place] in Chart? Yes  Would patient like information on creating a medical advance directive? -  Pre-existing out of facility DNR order (yellow form or pink MOST form) Yellow form placed in chart (order not valid for inpatient use)     Chief Complaint  Patient presents with  . Acute Visit    fell hit head,    HPI:  Pt is a 83 y.o. male seen today for an acute visit for 11/19/16 ED eval for fall, left arm skin tear, CT head/cervical spine/left hip: no acute process, incidental finding right upper lobe mass, recommended PET chest. Will recommend Pulmonology consultation.    Hx of prostate cancer, f/u Urology 10/11/16. GERD stable, on Famotine, memory is preserved on Namenda,  multiple sites OA is managed with Tylenol hs, prn Lorazepam is available at bedtime for sleep. Afib, heart rate is in controlled, taking Metoprolol, HTN controlled on Lisinopril 20mg , Hx of TIAs on Plavix and statin                Past Medical History:  Diagnosis Date  . Anxiety state, unspecified   . CAD (coronary artery disease) 11/20/13  . Carbon monoxide exposure 11/24/13  . Cervicalgia   . Degenerative disc disease   . Degenerative joint disease   . Diverticulitis   . Diverticulosis   . Ejection fraction   . Genital herpes   . Genital herpes 07/10/2013  . GERD (gastroesophageal reflux disease)   . Hip arthritis 09/29/2013   Bilateral  . Hypertension   . Hyponatremia 11/24/13  . Hypoxia 11/24/13  . Insomnia 07/09/2013  . Lumbago   . Lumbosacral spondylolysis   . Lumbosacral spondylosis without myelopathy 09/29/2013  . Malignant neoplasm of prostate ([facility])   . NSTEMI (non-ST elevated myocardial infarction) ([place] [facility]) 11/20/13  . OA (osteoarthritis) of knee 06/21/2013  . Other and unspecified hyperlipidemia   . Pneumonia 11/24/13  . Postoperative anemia due to acute blood loss 06/22/2013  . Prostate cancer ([facility]) 2012   mets to bone  . TIA (transient ischemic attack)    Past Surgical History:  Procedure Laterality Date  . CATARACT EXTRACTION Bilateral   . MASTOIDECTOMY Bilateral   . TONSILLECTOMY    . TOTAL KNEE ARTHROPLASTY Right 06/21/2013   Procedure: RIGHT TOTAL KNEE ARTHROPLASTY;  Surgeon: Gearlean Alf, MD;  Location: [facility];  Service: Orthopedics;  Laterality: Right;.  Dr. Wynelle Link    Allergies  Allergen Reactions  . Penicillins Other (See Comments)    Patient doesn't remember what kind of reaction  Has patient had a PCN reaction causing immediate rash, facial/tongue/throat swelling, SOB or lightheadedness with hypotension: n/a Has patient had a PCN reaction causing severe rash involving mucus membranes or skin necrosis:  N/a Has patient had a PCN reaction that required  hospitalization: n/a Has patient had a PCN reaction occurring within the last 10 years: n/a If all of the above answers are "NO", then may proceed with Cephalosporin use.     Allergies as of 11/19/2016      Reactions   Penicillins Other (See Comments)   Patient doesn't remember what kind of reaction Has patient had a PCN reaction causing immediate rash, facial/tongue/throat swelling, SOB or lightheadedness with hypotension: n/a Has patient had a PCN reaction causing severe rash involving mucus membranes or skin necrosis:  N/a Has patient had a PCN reaction that required hospitalization: n/a Has patient had a PCN reaction occurring within the last 10 years: n/a If all of the above answers are "NO", then may proceed with Cephalosporin use.      Medication List       Accurate as of 11/19/16  2:47 PM. Always use your most recent med list.          acetaminophen 500 MG tablet Commonly known as:  TYLENOL Take 1,000 mg by mouth at bedtime.   albuterol (2.5 MG/3ML) 0.083% NEBU 3 mL, albuterol (5 MG/ML) 0.5% NEBU 0.5 mL Inhale into the lungs every 8 (eight) hours.   ALIGN 4 MG Caps Take 4 mg by mouth daily.   B-complex with vitamin C tablet Take 1 tablet by mouth every morning.   bicalutamide 50 MG tablet Commonly known as:  CASODEX Take 1 tablet by mouth 2 (two) times daily.   chlorhexidine 0.12 % solution Commonly known as:  PERIDEX Use as directed 15 mLs in the mouth or throat daily as needed (mouth rinse).   clopidogrel 75 MG tablet Commonly known as:  PLAVIX Take 75 mg by mouth every morning.   famotidine 20 MG tablet Commonly known as:  PEPCID Take 20 mg by mouth daily. For heartburn   feeding supplement (ENSURE ENLIVE) Liqd Take 237 mLs by mouth 3 (three) times daily between meals.   lisinopril 40 MG tablet Commonly known as:  PRINIVIL,ZESTRIL Take 0.5 tablets (20 mg total) by mouth daily.   LORazepam 0.5 MG tablet Commonly known as:  ATIVAN Take 1 tablet  (0.5 mg total) by mouth at bedtime as needed for sleep.   memantine 5 MG tablet Commonly known as:  NAMENDA Take 5 mg by mouth daily.   metoprolol tartrate 25 MG tablet Commonly known as:  LOPRESSOR Take 1 tablet (25 mg total) by mouth 2 (two) times daily.   polyethylene glycol packet Commonly known as:  MIRALAX / GLYCOLAX Take 17 g by mouth daily as needed.   PRESERVISION AREDS 2+MULTI VIT Caps Take by mouth. Take one tablet twice daily   saccharomyces boulardii 250 MG capsule Commonly known as:  FLORASTOR Take 250 mg by mouth 2 (two) times daily.   SYSTANE 0.4-0.3 % Soln Generic drug:  Polyethyl Glycol-Propyl Glycol Apply 1 drop to eye daily.   zinc oxide 20 % ointment Apply 1 application topically. Apply to buttocks three times daily as needed for redness       Review of Systems  Constitutional: Positive for activity change, appetite change  and fatigue. Negative for fever and unexpected weight change.  HENT: Positive for congestion. Negative for ear pain, hearing loss, rhinorrhea, sore throat, tinnitus, trouble swallowing and voice change.   Eyes: Positive for visual disturbance (corrective lenses).       Corrective lenses  Respiratory: Positive for cough. Negative for choking, chest tightness, shortness of breath and wheezing.   Cardiovascular: Negative for chest pain, palpitations and leg swelling.       CAD with hx MI mar 2015  Gastrointestinal: Negative for abdominal distention, abdominal pain, constipation, diarrhea and nausea.  Endocrine: Negative for cold intolerance, heat intolerance, polydipsia, polyphagia and polyuria.  Genitourinary: Positive for difficulty urinating and frequency. Negative for dysuria, testicular pain and urgency.       Hx prostate cancer. UTI with sepsis in jan 2018. Foley, dysuria  Musculoskeletal: Negative for arthralgias, back pain, gait problem, myalgias and neck pain.       SP right knee replacement  Skin: Negative for color change,  pallor and rash.  Allergic/Immunologic: Negative.   Neurological: Negative for dizziness, tremors, syncope, speech difficulty, weakness, numbness and headaches.  Hematological: Negative for adenopathy. Does not bruise/bleed easily.  Psychiatric/Behavioral: Negative for behavioral problems, confusion, decreased concentration, hallucinations and sleep disturbance. The patient is not nervous/anxious.     Immunization History  Administered Date(s) Administered  . Tdap 05/05/2013   Pertinent  Health Maintenance Due  Topic Date Due  . FOOT EXAM  06/28/1936  . OPHTHALMOLOGY EXAM  06/28/1936  . PNA vac Low Risk Adult (1 of 2 - PCV13) 06/28/1993  . HEMOGLOBIN A1C  12/23/2013  . INFLUENZA VACCINE  03/26/2016   No flowsheet data found. Functional Status Survey:    Vitals:   11/19/16 1412  BP: 104/60  Pulse: 71  Resp: 18  Temp: 99.2 F (37.3 C)  SpO2: 93%  Weight: 142 lb 9.6 oz (64.7 kg)  Height: 5\' 9"  (1.753 m)   Body mass index is 21.06 kg/m. Physical Exam  Constitutional: He is oriented to person, place, and time. He appears well-developed and well-nourished. No distress.  HENT:  Right Ear: External ear normal.  Left Ear: External ear normal.  Nose: Nose normal.  Mouth/Throat: Oropharynx is clear and moist. No oropharyngeal exudate.  Eyes: Conjunctivae and EOM are normal. Pupils are equal, round, and reactive to light.  Neck: No JVD present. No tracheal deviation present. No thyromegaly present.  Cardiovascular: Normal rate, regular rhythm, normal heart sounds and intact distal pulses.  Exam reveals no gallop and no friction rub.   No murmur heard. Pulmonary/Chest: No respiratory distress. He has no wheezes. He has no rales. He exhibits no tenderness.  Scattered coarse rhonchi R+L lungs  Abdominal: He exhibits no distension and no mass. There is no tenderness.  Genitourinary:  Genitourinary Comments: Foley  Musculoskeletal: Normal range of motion. He exhibits no edema or  tenderness.  unstable on standiing  Lymphadenopathy:    He has no cervical adenopathy.  Neurological: He is alert and oriented to person, place, and time. He has normal reflexes. No cranial nerve deficit. Coordination normal.  Intention tremor in both arms  Skin: No rash noted. No erythema. No pallor.  Left forearm skin tear, no s/s of infection.   Psychiatric: He has a normal mood and affect. His behavior is normal. Judgment and thought content normal.    Labs reviewed:  Recent Labs  09/10/16 1325 09/10/16 1854 09/11/16 0030 11/09/16 11/15/16  NA 137 137 136 137 137  K 4.0 4.2 3.9 3.9  5.0  CL 102 98* 102  --   --   CO2 26 28 22   --   --   GLUCOSE 92 119* 97  --   --   BUN 25* 26* 26* 30* 32*  CREATININE 1.34* 1.20 1.23 1.1 1.4*  CALCIUM 8.9 9.3 8.9  --   --     Recent Labs  09/10/16 1325 09/10/16 1854 11/09/16  AST 22 24 34  ALT 14* 16* 23  ALKPHOS 59 69 75  BILITOT 0.8 0.7  --   PROT 6.1* 7.2  --   ALBUMIN 3.6 4.1  --     Recent Labs  09/10/16 1325 09/10/16 1854 09/11/16 0030  WBC 12.2* 13.4* 12.2*  NEUTROABS 8.6* 9.4*  --   HGB 13.3 14.6 15.0  HCT 39.5 42.9 43.7  MCV 97.5 97.3 96.3  PLT 171 178 158   Lab Results  Component Value Date   TSH 1.12 11/11/2016   Lab Results  Component Value Date   HGBA1C 5.5 06/24/2013   Lab Results  Component Value Date   CHOL 132 11/22/2013   HDL 77 11/22/2013   LDLCALC 44 11/22/2013   TRIG 55 11/22/2013   CHOLHDL 1.7 11/22/2013    Significant Diagnostic Results in last 30 days:  Ct Head Wo Contrast  Result Date: 11/19/2016 CLINICAL DATA:  Unwitnessed fall at nursing home. EXAM: CT HEAD WITHOUT CONTRAST CT CERVICAL SPINE WITHOUT CONTRAST TECHNIQUE: Multidetector CT imaging of the head and cervical spine was performed following the standard protocol without intravenous contrast. Multiplanar CT image reconstructions of the cervical spine were also generated. COMPARISON:  Most recent head CT 09/10/2016 FINDINGS: CT  HEAD FINDINGS Brain: Stable atrophy and chronic small vessel ischemia. No hemorrhage or evidence of acute infarct. No mass effect, midline shift, or hydrocephalus. No extra-axial fluid collection. Scattered basal gangliar calcifications are incidentally noted. Vascular: Atherosclerosis of skullbase vasculature without hyperdense vessel or abnormal calcification. Skull: No acute fracture. Stable sclerotic density in the left parietal bone. Sinuses/Orbits: Bilateral mastoidectomies. No acute inflammation. Paranasal sinuses are clear. Other: None. CT CERVICAL SPINE FINDINGS Alignment: Straightening of normal lordosis. No jumped or perched facets. Lateral masses of C1 are well aligned on C2. Skull base and vertebrae: No acute fracture. Skullbase and dens are intact. Endplate sclerosis at multiple levels secondary to degenerative disc disease. Soft tissues and spinal canal: No prevertebral fluid or swelling. No visible canal hematoma. Disc levels: Advanced degenerative disc disease throughout cervical spine. Near complete loss of disc height at C4-C5, C7-T1, and T1-T2. Advanced multilevel facet arthropathy. Multilevel neural foraminal stenosis. Upper chest: Spiculated 2.3 x 1.1 cm right apical nodule (when measured on coronal reformats), new from chest CT 11/21/2013. Apical emphysema. Other: Carotid calcifications. IMPRESSION: 1. Stable atrophy and chronic small vessel ischemia. No acute intracranial abnormality. 2. Advanced multilevel degenerative disc disease and facet arthropathy throughout cervical spine. No evidence of acute fracture or subluxation. 3. Spiculated 2.3 x 1.1 cm right apical nodule, new from March 2015 chest CT. This is concerning for primary bronchogenic malignancy given underlying emphysema. Consider PET-CT for metabolic characterization. Electronically Signed   By: Jeb Levering M.D.   On: 11/19/2016 02:06   Ct Cervical Spine Wo Contrast  Result Date: 11/19/2016 CLINICAL DATA:  Unwitnessed  fall at nursing home. EXAM: CT HEAD WITHOUT CONTRAST CT CERVICAL SPINE WITHOUT CONTRAST TECHNIQUE: Multidetector CT imaging of the head and cervical spine was performed following the standard protocol without intravenous contrast. Multiplanar CT image reconstructions of the cervical  spine were also generated. COMPARISON:  Most recent head CT 09/10/2016 FINDINGS: CT HEAD FINDINGS Brain: Stable atrophy and chronic small vessel ischemia. No hemorrhage or evidence of acute infarct. No mass effect, midline shift, or hydrocephalus. No extra-axial fluid collection. Scattered basal gangliar calcifications are incidentally noted. Vascular: Atherosclerosis of skullbase vasculature without hyperdense vessel or abnormal calcification. Skull: No acute fracture. Stable sclerotic density in the left parietal bone. Sinuses/Orbits: Bilateral mastoidectomies. No acute inflammation. Paranasal sinuses are clear. Other: None. CT CERVICAL SPINE FINDINGS Alignment: Straightening of normal lordosis. No jumped or perched facets. Lateral masses of C1 are well aligned on C2. Skull base and vertebrae: No acute fracture. Skullbase and dens are intact. Endplate sclerosis at multiple levels secondary to degenerative disc disease. Soft tissues and spinal canal: No prevertebral fluid or swelling. No visible canal hematoma. Disc levels: Advanced degenerative disc disease throughout cervical spine. Near complete loss of disc height at C4-C5, C7-T1, and T1-T2. Advanced multilevel facet arthropathy. Multilevel neural foraminal stenosis. Upper chest: Spiculated 2.3 x 1.1 cm right apical nodule (when measured on coronal reformats), new from chest CT 11/21/2013. Apical emphysema. Other: Carotid calcifications. IMPRESSION: 1. Stable atrophy and chronic small vessel ischemia. No acute intracranial abnormality. 2. Advanced multilevel degenerative disc disease and facet arthropathy throughout cervical spine. No evidence of acute fracture or subluxation. 3.  Spiculated 2.3 x 1.1 cm right apical nodule, new from March 2015 chest CT. This is concerning for primary bronchogenic malignancy given underlying emphysema. Consider PET-CT for metabolic characterization. Electronically Signed   By: Jeb Levering M.D.   On: 11/19/2016 02:06   Ct Hip Left Wo Contrast  Result Date: 11/19/2016 CLINICAL DATA:  Left hip pain after fall. EXAM: CT OF THE LEFT HIP WITHOUT CONTRAST TECHNIQUE: Multidetector CT imaging of the left hip was performed according to the standard protocol. Multiplanar CT image reconstructions were also generated. COMPARISON:  Radiographs earlier this day. FINDINGS: Bones/Joint/Cartilage No acute fracture. Femoral head well seated in the acetabulum. Moderate hip joint osteoarthritis with joint space narrowing and subchondral cysts. Pubic rami are intact. Pubic symphysis is congruent. Faint sclerotic densities within the left iliac bone are similar to prior abdominal CT. Ligaments Suboptimally assessed by CT. Muscles and Tendons No large muscular hematoma.  Muscle bulk is preserved. Soft tissues Foley catheter in the urinary bladder with small bladder wall thickening. Vascular calcifications. Left testicle high-riding in the inguinal canal. No large soft tissue hematoma. IMPRESSION: 1. No acute fracture of the left hip. 2. Moderate left hip osteoarthritis. Electronically Signed   By: Jeb Levering M.D.   On: 11/19/2016 03:22   Dg Hip Unilat W Or Wo Pelvis 2-3 Views Left  Result Date: 11/19/2016 CLINICAL DATA:  Status post fall, with left lateral hip pain. Initial encounter. EXAM: DG HIP (WITH OR WITHOUT PELVIS) 2-3V LEFT COMPARISON:  None. FINDINGS: There is no evidence of fracture or dislocation. Axial joint space narrowing is noted at both hips. The proximal left femur appears intact. Degenerative change is noted along the lower lumbar spine. The sacroiliac joints are unremarkable in appearance. The visualized bowel gas pattern is grossly unremarkable  in appearance. Diffuse vascular calcifications are seen. IMPRESSION: 1. No definite evidence of fracture or dislocation. 2. Axial joint space narrowing at both hips. 3. Degenerative change along the lower lumbar spine. 4. Diffuse vascular calcifications seen. Electronically Signed   By: Garald Balding M.D.   On: 11/19/2016 01:26    Assessment/Plan Mass of upper lobe of right lung 11/19/16 ED eval for  fall, left arm skin tear, CT head/cervical spine/left hip: no acute process, incidental finding right upper lobe mass, recommended PET chest. Will recommend Pulmonology consultation.    Memory deficit Repetitive, continue Namenda.    Insomnia Continue Lorazepam prn at night  Urinary retention 09/24/16 urology Foley   Prostate cancer [facility]) Bone mets Treated with Firmagon since  10/22/2010 and subsequently Lupron on 11/27/2010 and has been on it since that time.  Casodex added on 01/06/2013. He started Xgeva in June of 2012 10/28/16 urology Lupron q 4 months, PSA testosterone bone scan Foley  GERD (gastroesophageal reflux disease) Stable. Continue Famotidine.    Pneumonia 11/05/16 CXR no focal infiltrates or pleural effusion.  11/08/16 CXR patchy interstitial left lower lung medially consistent with pneumonia 11/10/16 Na 137, K 3.9, Bun 30, creat 1.14, TSH 1.12 Fully treated with Z-pk, Avelox 400mg  po qd x 10days, DuoNeb tid x 5 days, Medrol dose pk  Urinary tract infection 10/25/16 urine culture Ps Aeruginosa, Cipro 500mg  po bid x 10 days.   Essential hypertension Controlled, continue Lisinopril and Metoprolol.        Family/ staff Communication: [place] when able. Pulmonology consultation  Labs/tests ordered:  PET chest scan

## 2016-11-19 NOTE — Progress Notes (Signed)
Location:  [facility] Room Number: 63 Place of Service:  SNF (31) Provider:  Mast, Manxie  NP  [name], MD  Patient Care Team: Estill Dooms, MD as PCP - General (Internal Medicine) Wyatt Portela, MD (Hematology and Oncology) Franchot Gallo, MD as Attending Physician (Urology) Gaynelle Arabian, MD as Consulting Physician (Orthopedic Surgery) Kristeen Miss, MD as Consulting Physician (Neurosurgery) Man Otho Darner, NP as Nurse Practitioner (Internal Medicine)  Extended Emergency Contact Information Primary Emergency Contact: [name],[name] Address: [address]          [place], [place] [address] [place] Phone: [phone] Work Phone: [phone] Mobile Phone: [phone] Relation: Daughter Secondary Emergency Contact: [place] Phone: [phone] Mobile Phone: [phone] Relation: Son  Code Status:  DNR Goals of care: Advanced Directive information Advanced Directives 11/19/2016  Does Patient Have a Medical Advance Directive? Yes  Type of Advance Directive Out of facility DNR (pink MOST or yellow form)  Does patient want to make changes to medical advance directive? No - Patient declined  Copy of [place] in Chart? Yes  Would patient like information on creating a medical advance directive? -  Pre-existing out of facility DNR order (yellow form or pink MOST form) Yellow form placed in chart (order not valid for inpatient use)     Chief Complaint  Patient presents with  . Acute Visit    fell hit head,    HPI:  Pt is a 83 y.o. male seen today for an acute visit for    Past Medical History:  Diagnosis Date  . Anxiety state, unspecified   . CAD (coronary artery disease) 11/20/13  . Carbon monoxide exposure 11/24/13  . Cervicalgia   . Degenerative disc disease   . Degenerative joint disease   . Diverticulitis   . Diverticulosis   . Ejection fraction   . Genital  herpes   . Genital herpes 07/10/2013  . GERD (gastroesophageal reflux disease)   . Hip arthritis 09/29/2013   Bilateral  . Hypertension   . Hyponatremia 11/24/13  . Hypoxia 11/24/13  . Insomnia 07/09/2013  . Lumbago   . Lumbosacral spondylolysis   . Lumbosacral spondylosis without myelopathy 09/29/2013  . Malignant neoplasm of prostate ([place])   . NSTEMI (non-ST elevated myocardial infarction) ([facility]) 11/20/13  . OA (osteoarthritis) of knee 06/21/2013  . Other and unspecified hyperlipidemia   . Pneumonia 11/24/13  . Postoperative anemia due to acute blood loss 06/22/2013  . Prostate cancer ([place]) 2012   mets to bone  . TIA (transient ischemic attack)    Past Surgical History:  Procedure Laterality Date  . CATARACT EXTRACTION Bilateral   . MASTOIDECTOMY Bilateral   . TONSILLECTOMY    . TOTAL KNEE ARTHROPLASTY Right 06/21/2013   Procedure: RIGHT TOTAL KNEE ARTHROPLASTY;  Surgeon: Gearlean Alf, MD;  Location: [facility]S;  Service: Orthopedics;  Laterality: Right;. Dr. Wynelle Link    Allergies  Allergen Reactions  . Penicillins Other (See Comments)    Patient doesn't remember what kind of reaction  Has patient had a PCN reaction causing immediate rash, facial/tongue/throat swelling, SOB or lightheadedness with hypotension: n/a Has patient had a PCN reaction causing severe rash involving mucus membranes or skin necrosis:  N/a Has patient had a PCN reaction that required hospitalization: n/a Has patient had a PCN reaction occurring within the last 10 years: n/a If all of the above answers are "NO", then may proceed with Cephalosporin  use.     Allergies as of 11/19/2016      Reactions   Penicillins Other (See Comments)   Patient doesn't remember what kind of reaction Has patient had a PCN reaction causing immediate rash, facial/tongue/throat swelling, SOB or lightheadedness with hypotension: n/a Has patient had a PCN reaction causing severe rash involving mucus membranes or skin necrosis:   N/a Has patient had a PCN reaction that required hospitalization: n/a Has patient had a PCN reaction occurring within the last 10 years: n/a If all of the above answers are "NO", then may proceed with Cephalosporin use.      Medication List       Accurate as of 11/19/16  2:36 PM. Always use your most recent med list.          acetaminophen 500 MG tablet Commonly known as:  TYLENOL Take 1,000 mg by mouth at bedtime.   albuterol (2.5 MG/3ML) 0.083% NEBU 3 mL, albuterol (5 MG/ML) 0.5% NEBU 0.5 mL Inhale into the lungs every 8 (eight) hours.   ALIGN 4 MG Caps Take 4 mg by mouth daily.   B-complex with vitamin C tablet Take 1 tablet by mouth every morning.   bicalutamide 50 MG tablet Commonly known as:  CASODEX Take 1 tablet by mouth 2 (two) times daily.   chlorhexidine 0.12 % solution Commonly known as:  PERIDEX Use as directed 15 mLs in the mouth or throat daily as needed (mouth rinse).   clopidogrel 75 MG tablet Commonly known as:  PLAVIX Take 75 mg by mouth every morning.   famotidine 20 MG tablet Commonly known as:  PEPCID Take 20 mg by mouth daily. For heartburn   feeding supplement (ENSURE ENLIVE) Liqd Take 237 mLs by mouth 3 (three) times daily between meals.   lisinopril 40 MG tablet Commonly known as:  PRINIVIL,ZESTRIL Take 0.5 tablets (20 mg total) by mouth daily.   LORazepam 0.5 MG tablet Commonly known as:  ATIVAN Take 1 tablet (0.5 mg total) by mouth at bedtime as needed for sleep.   memantine 5 MG tablet Commonly known as:  NAMENDA Take 5 mg by mouth daily.   metoprolol tartrate 25 MG tablet Commonly known as:  LOPRESSOR Take 1 tablet (25 mg total) by mouth 2 (two) times daily.   polyethylene glycol packet Commonly known as:  MIRALAX / GLYCOLAX Take 17 g by mouth daily as needed.   PRESERVISION AREDS 2+MULTI VIT Caps Take by mouth. Take one tablet twice daily   saccharomyces boulardii 250 MG capsule Commonly known as:  FLORASTOR Take 250  mg by mouth 2 (two) times daily.   SYSTANE 0.4-0.3 % Soln Generic drug:  Polyethyl Glycol-Propyl Glycol Apply 1 drop to eye daily.   zinc oxide 20 % ointment Apply 1 application topically. Apply to buttocks three times daily as needed for redness       Review of Systems  Immunization History  Administered Date(s) Administered  . Tdap 05/05/2013   Pertinent  Health Maintenance Due  Topic Date Due  . FOOT EXAM  06/28/1936  . OPHTHALMOLOGY EXAM  06/28/1936  . PNA vac Low Risk Adult (1 of 2 - PCV13) 06/28/1993  . HEMOGLOBIN A1C  12/23/2013  . INFLUENZA VACCINE  03/26/2016   No flowsheet data found. Functional Status Survey:    Vitals:   11/19/16 1412  BP: 104/60  Pulse: 71  Resp: 18  Temp: 99.2 F (37.3 C)  SpO2: 93%  Weight: 142 lb 9.6 oz (64.7 kg)  Height:  5\' 9"  (1.753 m)   Body mass index is 21.06 kg/m. Physical Exam  Labs reviewed:  Recent Labs  09/10/16 1325 09/10/16 1854 09/11/16 0030 11/09/16 11/15/16  NA 137 137 136 137 137  K 4.0 4.2 3.9 3.9 5.0  CL 102 98* 102  --   --   CO2 26 28 22   --   --   GLUCOSE 92 119* 97  --   --   BUN 25* 26* 26* 30* 32*  CREATININE 1.34* 1.20 1.23 1.1 1.4*  CALCIUM 8.9 9.3 8.9  --   --     Recent Labs  09/10/16 1325 09/10/16 1854 11/09/16  AST 22 24 34  ALT 14* 16* 23  ALKPHOS 59 69 75  BILITOT 0.8 0.7  --   PROT 6.1* 7.2  --   ALBUMIN 3.6 4.1  --     Recent Labs  09/10/16 1325 09/10/16 1854 09/11/16 0030  WBC 12.2* 13.4* 12.2*  NEUTROABS 8.6* 9.4*  --   HGB 13.3 14.6 15.0  HCT 39.5 42.9 43.7  MCV 97.5 97.3 96.3  PLT 171 178 158   Lab Results  Component Value Date   TSH 1.12 11/11/2016   Lab Results  Component Value Date   HGBA1C 5.5 06/24/2013   Lab Results  Component Value Date   CHOL 132 11/22/2013   HDL 77 11/22/2013   LDLCALC 44 11/22/2013   TRIG 55 11/22/2013   CHOLHDL 1.7 11/22/2013    Significant Diagnostic Results in last 30 days:  Ct Head Wo Contrast  Result Date:  11/19/2016 CLINICAL DATA:  Unwitnessed fall at nursing home. EXAM: CT HEAD WITHOUT CONTRAST CT CERVICAL SPINE WITHOUT CONTRAST TECHNIQUE: Multidetector CT imaging of the head and cervical spine was performed following the standard protocol without intravenous contrast. Multiplanar CT image reconstructions of the cervical spine were also generated. COMPARISON:  Most recent head CT 09/10/2016 FINDINGS: CT HEAD FINDINGS Brain: Stable atrophy and chronic small vessel ischemia. No hemorrhage or evidence of acute infarct. No mass effect, midline shift, or hydrocephalus. No extra-axial fluid collection. Scattered basal gangliar calcifications are incidentally noted. Vascular: Atherosclerosis of skullbase vasculature without hyperdense vessel or abnormal calcification. Skull: No acute fracture. Stable sclerotic density in the left parietal bone. Sinuses/Orbits: Bilateral mastoidectomies. No acute inflammation. Paranasal sinuses are clear. Other: None. CT CERVICAL SPINE FINDINGS Alignment: Straightening of normal lordosis. No jumped or perched facets. Lateral masses of C1 are well aligned on C2. Skull base and vertebrae: No acute fracture. Skullbase and dens are intact. Endplate sclerosis at multiple levels secondary to degenerative disc disease. Soft tissues and spinal canal: No prevertebral fluid or swelling. No visible canal hematoma. Disc levels: Advanced degenerative disc disease throughout cervical spine. Near complete loss of disc height at C4-C5, C7-T1, and T1-T2. Advanced multilevel facet arthropathy. Multilevel neural foraminal stenosis. Upper chest: Spiculated 2.3 x 1.1 cm right apical nodule (when measured on coronal reformats), new from chest CT 11/21/2013. Apical emphysema. Other: Carotid calcifications. IMPRESSION: 1. Stable atrophy and chronic small vessel ischemia. No acute intracranial abnormality. 2. Advanced multilevel degenerative disc disease and facet arthropathy throughout cervical spine. No evidence  of acute fracture or subluxation. 3. Spiculated 2.3 x 1.1 cm right apical nodule, new from March 2015 chest CT. This is concerning for primary bronchogenic malignancy given underlying emphysema. Consider PET-CT for metabolic characterization. Electronically Signed   By: Jeb Levering M.D.   On: 11/19/2016 02:06   Ct Cervical Spine Wo Contrast  Result Date: 11/19/2016 CLINICAL DATA:  Unwitnessed fall at nursing home. EXAM: CT HEAD WITHOUT CONTRAST CT CERVICAL SPINE WITHOUT CONTRAST TECHNIQUE: Multidetector CT imaging of the head and cervical spine was performed following the standard protocol without intravenous contrast. Multiplanar CT image reconstructions of the cervical spine were also generated. COMPARISON:  Most recent head CT 09/10/2016 FINDINGS: CT HEAD FINDINGS Brain: Stable atrophy and chronic small vessel ischemia. No hemorrhage or evidence of acute infarct. No mass effect, midline shift, or hydrocephalus. No extra-axial fluid collection. Scattered basal gangliar calcifications are incidentally noted. Vascular: Atherosclerosis of skullbase vasculature without hyperdense vessel or abnormal calcification. Skull: No acute fracture. Stable sclerotic density in the left parietal bone. Sinuses/Orbits: Bilateral mastoidectomies. No acute inflammation. Paranasal sinuses are clear. Other: None. CT CERVICAL SPINE FINDINGS Alignment: Straightening of normal lordosis. No jumped or perched facets. Lateral masses of C1 are well aligned on C2. Skull base and vertebrae: No acute fracture. Skullbase and dens are intact. Endplate sclerosis at multiple levels secondary to degenerative disc disease. Soft tissues and spinal canal: No prevertebral fluid or swelling. No visible canal hematoma. Disc levels: Advanced degenerative disc disease throughout cervical spine. Near complete loss of disc height at C4-C5, C7-T1, and T1-T2. Advanced multilevel facet arthropathy. Multilevel neural foraminal stenosis. Upper chest:  Spiculated 2.3 x 1.1 cm right apical nodule (when measured on coronal reformats), new from chest CT 11/21/2013. Apical emphysema. Other: Carotid calcifications. IMPRESSION: 1. Stable atrophy and chronic small vessel ischemia. No acute intracranial abnormality. 2. Advanced multilevel degenerative disc disease and facet arthropathy throughout cervical spine. No evidence of acute fracture or subluxation. 3. Spiculated 2.3 x 1.1 cm right apical nodule, new from March 2015 chest CT. This is concerning for primary bronchogenic malignancy given underlying emphysema. Consider PET-CT for metabolic characterization. Electronically Signed   By: Jeb Levering M.D.   On: 11/19/2016 02:06   Ct Hip Left Wo Contrast  Result Date: 11/19/2016 CLINICAL DATA:  Left hip pain after fall. EXAM: CT OF THE LEFT HIP WITHOUT CONTRAST TECHNIQUE: Multidetector CT imaging of the left hip was performed according to the standard protocol. Multiplanar CT image reconstructions were also generated. COMPARISON:  Radiographs earlier this day. FINDINGS: Bones/Joint/Cartilage No acute fracture. Femoral head well seated in the acetabulum. Moderate hip joint osteoarthritis with joint space narrowing and subchondral cysts. Pubic rami are intact. Pubic symphysis is congruent. Faint sclerotic densities within the left iliac bone are similar to prior abdominal CT. Ligaments Suboptimally assessed by CT. Muscles and Tendons No large muscular hematoma.  Muscle bulk is preserved. Soft tissues Foley catheter in the urinary bladder with small bladder wall thickening. Vascular calcifications. Left testicle high-riding in the inguinal canal. No large soft tissue hematoma. IMPRESSION: 1. No acute fracture of the left hip. 2. Moderate left hip osteoarthritis. Electronically Signed   By: Jeb Levering M.D.   On: 11/19/2016 03:22   Dg Hip Unilat W Or Wo Pelvis 2-3 Views Left  Result Date: 11/19/2016 CLINICAL DATA:  Status post fall, with left lateral hip  pain. Initial encounter. EXAM: DG HIP (WITH OR WITHOUT PELVIS) 2-3V LEFT COMPARISON:  None. FINDINGS: There is no evidence of fracture or dislocation. Axial joint space narrowing is noted at both hips. The proximal left femur appears intact. Degenerative change is noted along the lower lumbar spine. The sacroiliac joints are unremarkable in appearance. The visualized bowel gas pattern is grossly unremarkable in appearance. Diffuse vascular calcifications are seen. IMPRESSION: 1. No definite evidence of fracture or dislocation. 2. Axial joint space narrowing at both hips. 3.  Degenerative change along the lower lumbar spine. 4. Diffuse vascular calcifications seen. Electronically Signed   By: Garald Balding M.D.   On: 11/19/2016 01:26    Assessment/Plan There are no diagnoses linked to this encounter.  Family/ staff Communication:   Labs/tests ordered:

## 2016-11-19 NOTE — Assessment & Plan Note (Addendum)
11/05/16 CXR no focal infiltrates or pleural effusion.  11/08/16 CXR patchy interstitial left lower lung medially consistent with pneumonia 11/10/16 Na 137, K 3.9, Bun 30, creat 1.14, TSH 1.12 Fully treated with Z-pk, Avelox 400mg  po qd x 10days, DuoNeb tid x 5 days, Medrol dose pk

## 2016-11-19 NOTE — Assessment & Plan Note (Signed)
Continue Lorazepam prn at night

## 2016-11-19 NOTE — ED Triage Notes (Signed)
Pt was brought in by EMS from [facility] in where the patient was found in bed  Pt had an unwitnessed fall around 10pm  11/18/16  per facility staff. Pt as per EMS is Alert x 3 and is c/o right rib pain, Per facility staff the patient had a hematoma as per EMS was unfounded. Per EMS noted that pt had some left leg shortening and with palpation by EMS pt c/o of pain. Pt had multiple skin tears noted on left arm .   CBG 116

## 2016-11-19 NOTE — Assessment & Plan Note (Signed)
10/25/16 urine culture Ps Aeruginosa, Cipro 500mg  po bid x 10 days.

## 2016-11-19 NOTE — ED Provider Notes (Signed)
[name] DEPT Provider Note   CSN: [id number] Arrival date & time: 11/19/16  0031  By signing my name below, I, Dolores Hoose, attest that this documentation has been prepared under the direction and in the presence of Tiffinie Caillier, MD . Electronically Signed: Dolores Hoose, Scribe. 11/19/2016. 12:47 AM.  History   Chief Complaint Chief Complaint  Patient presents with  . Fall   LEVEL 5 CAVEAT DUE TO DEMENTIA  The history is provided by the patient and the EMS personnel. No language interpreter was used.  Fall  This is a new problem. The current episode started 3 to 5 hours ago. The problem occurs rarely. The problem has not changed since onset.Nothing aggravates the symptoms. Nothing relieves the symptoms. He has tried nothing for the symptoms. The treatment provided no relief.   HPI Comments:  Craig [name] is a 83 y.o. male with pmxh of dementia, CAD, HTN, NSTEMI and prostate CA who presents to the Emergency Department by ambulance after an unwitnessed fall at his nursing home about 3 hours ago. Per EMS, pt reports associated rib pain, left leg pain and skin tears on left arm. EMS also state that the pt reported hitting his head at the time of the accident.   Past Medical History:  Diagnosis Date  . Anxiety state, unspecified   . CAD (coronary artery disease) 11/20/13  . Carbon monoxide exposure 11/24/13  . Cervicalgia   . Degenerative disc disease   . Degenerative joint disease   . Diverticulitis   . Diverticulosis   . Ejection fraction   . Genital herpes   . Genital herpes 07/10/2013  . GERD (gastroesophageal reflux disease)   . Hip arthritis 09/29/2013   Bilateral  . Hypertension   . Hyponatremia 11/24/13  . Hypoxia 11/24/13  . Insomnia 07/09/2013  . Lumbago   . Lumbosacral spondylolysis   . Lumbosacral spondylosis without myelopathy 09/29/2013  . Malignant neoplasm of prostate ([place])   . NSTEMI (non-ST elevated myocardial infarction) ([place]) 11/20/13  . OA  (osteoarthritis) of knee 06/21/2013  . Other and unspecified hyperlipidemia   . Pneumonia 11/24/13  . Postoperative anemia due to acute blood loss 06/22/2013  . Prostate cancer ([place]) 2012   mets to bone  . TIA (transient ischemic attack)     Patient Active Problem List   Diagnosis Date Noted  . Pneumonia 11/05/2016  . Memory deficit 10/22/2016  . Urinary retention 09/27/2016  . PSVT (paroxysmal supraventricular tachycardia) ([place]) 09/14/2016  . Urinary tract infection 09/14/2016  . Sepsis ([place]) 09/14/2016  . Unresponsive episode 09/10/2016  . Tremor 09/10/2016  . Ejection fraction   . Community acquired pneumonia 11/25/2013  . NSTEMI (non-ST elevated myocardial infarction) ([place]) 11/21/2013  . Hyperlipidemia 11/21/2013  . Lumbosacral spondylosis without myelopathy 09/29/2013  . Hip arthritis 09/29/2013  . GERD (gastroesophageal reflux disease) 07/13/2013  . Genital herpes 07/10/2013  . Essential hypertension 07/10/2013  . Insomnia 07/09/2013  . TIA (transient ischemic attack) 06/24/2013  . OA (osteoarthritis) of knee 06/21/2013  . Prostate cancer ([place]) 04/14/2012    Past Surgical History:  Procedure Laterality Date  . CATARACT EXTRACTION Bilateral   . MASTOIDECTOMY Bilateral   . TONSILLECTOMY    . TOTAL KNEE ARTHROPLASTY Right 06/21/2013   Procedure: RIGHT TOTAL KNEE ARTHROPLASTY;  Surgeon: Gearlean Alf, MD;  Location: [facility];  Service: Orthopedics;  Laterality: Right;. Dr. Wynelle Link       Home Medications    Prior to Admission medications   Medication Sig Start Date  End Date Taking? Authorizing Provider  acetaminophen (TYLENOL) 500 MG tablet Take 1,000 mg by mouth at bedtime.    Historical Provider, MD  B Complex-C (B-COMPLEX WITH VITAMIN C) tablet Take 1 tablet by mouth every morning.     Historical Provider, MD  bicalutamide (CASODEX) 50 MG tablet Take 50 mg by mouth every morning.  09/16/13   Historical Provider, MD  chlorhexidine (PERIDEX) 0.12 % solution Use as  directed 15 mLs in the mouth or throat daily as needed (mouth rinse).    Historical Provider, MD  clopidogrel (PLAVIX) 75 MG tablet Take 75 mg by mouth every morning.     Historical Provider, MD  famotidine (PEPCID) 20 MG tablet Take 20 mg by mouth daily. For heartburn    Historical Provider, MD  feeding supplement, ENSURE ENLIVE, (ENSURE ENLIVE) LIQD Take 237 mLs by mouth 3 (three) times daily between meals. 09/14/16   Debbe Odea, MD  lisinopril (PRINIVIL,ZESTRIL) 40 MG tablet Take 0.5 tablets (20 mg total) by mouth daily. 09/14/16   Debbe Odea, MD  LORazepam (ATIVAN) 0.5 MG tablet Take 1 tablet (0.5 mg total) by mouth at bedtime as needed for sleep. 09/14/16   Debbe Odea, MD  memantine (NAMENDA) 5 MG tablet Take 5 mg by mouth daily.    Historical Provider, MD  metoprolol tartrate (LOPRESSOR) 25 MG tablet Take 1 tablet (25 mg total) by mouth 2 (two) times daily. 09/14/16   Debbe Odea, MD  Multiple Vitamins-Minerals (PRESERVISION AREDS 2+MULTI VIT) CAPS Take by mouth. Take one tablet twice daily    Historical Provider, MD  Polyethyl Glycol-Propyl Glycol (SYSTANE) 0.4-0.3 % SOLN Apply 1 drop to eye daily.    Historical Provider, MD  polyethylene glycol (MIRALAX / GLYCOLAX) packet Take 17 g by mouth daily as needed.    Historical Provider, MD  Probiotic Product (ALIGN) 4 MG CAPS Take 4 mg by mouth daily.    Historical Provider, MD  saccharomyces boulardii (FLORASTOR) 250 MG capsule Take 250 mg by mouth 2 (two) times daily.    Historical Provider, MD  zinc oxide 20 % ointment Apply 1 application topically. Apply to buttocks three times daily as needed for redness    Historical Provider, MD    Family History Family History  Problem Relation Age of Onset  . Heart disease Mother     Social History Social History  Substance Use Topics  . Smoking status: Former Smoker    Packs/day: 2.00    Years: 15.00    Quit date: 01/14/1984  . Smokeless tobacco: Never Used  . Alcohol use 1.2 oz/week    2  Shots of liquor per week     Comment: occasional     Allergies   Penicillins   Review of Systems Review of Systems  Unable to perform ROS: Dementia  All other systems reviewed and are negative.    Physical Exam Updated Vital Signs BP 134/66 (BP Location: Left Arm)   Pulse 66   Temp 97.5 F (36.4 C) (Oral)   Resp 16   SpO2 96%   Physical Exam  Constitutional: He is oriented to person, place, and time. He appears well-developed and well-nourished. No distress.  HENT:  Head: Normocephalic and atraumatic.  Mouth/Throat: Oropharynx is clear and moist.  No hemotympanum on right.   Eyes: Conjunctivae are normal. Pupils are equal, round, and reactive to light.  No Battle's sign. No Raccoon eyes.   Neck: Normal range of motion. Neck supple.  Cardiovascular: Normal rate, regular rhythm, normal  heart sounds and intact distal pulses.   Pulmonary/Chest: Effort normal and breath sounds normal. No respiratory distress.  Abdominal: Soft. Bowel sounds are normal. He exhibits no distension. There is no tenderness.  Pelvis stable  Genitourinary:  Genitourinary Comments: Foley catheter in place.   Musculoskeletal:  Negative anterior and posterior drawer testing to left knee. No laxity to valgus or vagus stress or rigidity of the knee. No patella alta or baja. Patellar and quadriceps tendons intact. No obvious deformity.  Neurological: He is alert and oriented to person, place, and time. He has normal reflexes.  Skin: Skin is warm and dry.  Psychiatric: He has a normal mood and affect. His behavior is normal.  Nursing note and vitals reviewed.    ED Treatments / Results   Vitals:   11/19/16 0032  BP: 134/66  Pulse: 66  Resp: 16  Temp: 97.5 F (36.4 C)    DIAGNOSTIC STUDIES:  Oxygen Saturation is 96% on RA, adequate by my interpretation.    COORDINATION OF CARE:  12:42 AM Discussed treatment plan with pt at bedside which includes imagingand pt agreed to plan.  2:20 AM  Family at bedside. Informed pt and daughter of results of CT scans, including findings of spiculated 2.3 x 1.1 cm right apical nodule.    Radiology  Results for orders placed or performed in visit on 11/12/16  TSH  Result Value Ref Range   TSH 1.12 0.41 - 5.90 uIU/mL   Ct Head Wo Contrast  Result Date: 11/19/2016 CLINICAL DATA:  Unwitnessed fall at nursing home. EXAM: CT HEAD WITHOUT CONTRAST CT CERVICAL SPINE WITHOUT CONTRAST TECHNIQUE: Multidetector CT imaging of the head and cervical spine was performed following the standard protocol without intravenous contrast. Multiplanar CT image reconstructions of the cervical spine were also generated. COMPARISON:  Most recent head CT 09/10/2016 FINDINGS: CT HEAD FINDINGS Brain: Stable atrophy and chronic small vessel ischemia. No hemorrhage or evidence of acute infarct. No mass effect, midline shift, or hydrocephalus. No extra-axial fluid collection. Scattered basal gangliar calcifications are incidentally noted. Vascular: Atherosclerosis of skullbase vasculature without hyperdense vessel or abnormal calcification. Skull: No acute fracture. Stable sclerotic density in the left parietal bone. Sinuses/Orbits: Bilateral mastoidectomies. No acute inflammation. Paranasal sinuses are clear. Other: None. CT CERVICAL SPINE FINDINGS Alignment: Straightening of normal lordosis. No jumped or perched facets. Lateral masses of C1 are well aligned on C2. Skull base and vertebrae: No acute fracture. Skullbase and dens are intact. Endplate sclerosis at multiple levels secondary to degenerative disc disease. Soft tissues and spinal canal: No prevertebral fluid or swelling. No visible canal hematoma. Disc levels: Advanced degenerative disc disease throughout cervical spine. Near complete loss of disc height at C4-C5, C7-T1, and T1-T2. Advanced multilevel facet arthropathy. Multilevel neural foraminal stenosis. Upper chest: Spiculated 2.3 x 1.1 cm right apical nodule (when  measured on coronal reformats), new from chest CT 11/21/2013. Apical emphysema. Other: Carotid calcifications. IMPRESSION: 1. Stable atrophy and chronic small vessel ischemia. No acute intracranial abnormality. 2. Advanced multilevel degenerative disc disease and facet arthropathy throughout cervical spine. No evidence of acute fracture or subluxation. 3. Spiculated 2.3 x 1.1 cm right apical nodule, new from March 2015 chest CT. This is concerning for primary bronchogenic malignancy given underlying emphysema. Consider PET-CT for metabolic characterization. Electronically Signed   By: Jeb Levering M.D.   On: 11/19/2016 02:06   Ct Cervical Spine Wo Contrast  Result Date: 11/19/2016 CLINICAL DATA:  Unwitnessed fall at nursing home. EXAM: CT HEAD WITHOUT CONTRAST  CT CERVICAL SPINE WITHOUT CONTRAST TECHNIQUE: Multidetector CT imaging of the head and cervical spine was performed following the standard protocol without intravenous contrast. Multiplanar CT image reconstructions of the cervical spine were also generated. COMPARISON:  Most recent head CT 09/10/2016 FINDINGS: CT HEAD FINDINGS Brain: Stable atrophy and chronic small vessel ischemia. No hemorrhage or evidence of acute infarct. No mass effect, midline shift, or hydrocephalus. No extra-axial fluid collection. Scattered basal gangliar calcifications are incidentally noted. Vascular: Atherosclerosis of skullbase vasculature without hyperdense vessel or abnormal calcification. Skull: No acute fracture. Stable sclerotic density in the left parietal bone. Sinuses/Orbits: Bilateral mastoidectomies. No acute inflammation. Paranasal sinuses are clear. Other: None. CT CERVICAL SPINE FINDINGS Alignment: Straightening of normal lordosis. No jumped or perched facets. Lateral masses of C1 are well aligned on C2. Skull base and vertebrae: No acute fracture. Skullbase and dens are intact. Endplate sclerosis at multiple levels secondary to degenerative disc disease. Soft  tissues and spinal canal: No prevertebral fluid or swelling. No visible canal hematoma. Disc levels: Advanced degenerative disc disease throughout cervical spine. Near complete loss of disc height at C4-C5, C7-T1, and T1-T2. Advanced multilevel facet arthropathy. Multilevel neural foraminal stenosis. Upper chest: Spiculated 2.3 x 1.1 cm right apical nodule (when measured on coronal reformats), new from chest CT 11/21/2013. Apical emphysema. Other: Carotid calcifications. IMPRESSION: 1. Stable atrophy and chronic small vessel ischemia. No acute intracranial abnormality. 2. Advanced multilevel degenerative disc disease and facet arthropathy throughout cervical spine. No evidence of acute fracture or subluxation. 3. Spiculated 2.3 x 1.1 cm right apical nodule, new from March 2015 chest CT. This is concerning for primary bronchogenic malignancy given underlying emphysema. Consider PET-CT for metabolic characterization. Electronically Signed   By: Jeb Levering M.D.   On: 11/19/2016 02:06   Dg Hip Unilat W Or Wo Pelvis 2-3 Views Left  Result Date: 11/19/2016 CLINICAL DATA:  Status post fall, with left lateral hip pain. Initial encounter. EXAM: DG HIP (WITH OR WITHOUT PELVIS) 2-3V LEFT COMPARISON:  None. FINDINGS: There is no evidence of fracture or dislocation. Axial joint space narrowing is noted at both hips. The proximal left femur appears intact. Degenerative change is noted along the lower lumbar spine. The sacroiliac joints are unremarkable in appearance. The visualized bowel gas pattern is grossly unremarkable in appearance. Diffuse vascular calcifications are seen. IMPRESSION: 1. No definite evidence of fracture or dislocation. 2. Axial joint space narrowing at both hips. 3. Degenerative change along the lower lumbar spine. 4. Diffuse vascular calcifications seen. Electronically Signed   By: Garald Balding M.D.   On: 11/19/2016 01:26    Procedures Procedures (including critical care time)    Final  Clinical Impressions(s) / ED Diagnoses  Fall and incidental finding of RUL spiculated lung mass seen on CT.  ED had a lengthy discussion with patient and his daughter about this finding. They will call their oncologist, Dr. Osker Mason in the am to set up outpatient PET scan.  I have also printed this information on the discharge paperwork for the patient and his daughter.  He is currently symptom free.    The patient is nontoxic-appearing on exam and vital signs are within normal limits. Return for intractable pain, inability to walk, shortness of breath, chest pain hemoptysis or any concerns.  After history, exam, and medical workup I feel the patient has been appropriately medically screened and is safe for discharge home. Pertinent diagnoses were discussed with the patient. Patient was given return precautions.  I personally performed the services  described in this documentation, which was scribed in my presence. The recorded information has been reviewed and is accurate.     Veatrice Kells, MD 11/19/16 [phone]

## 2016-11-19 NOTE — Assessment & Plan Note (Signed)
Controlled, continue Lisinopril and Metoprolol.

## 2016-11-19 NOTE — Assessment & Plan Note (Signed)
Stable. Continue Famotidine.

## 2016-11-19 NOTE — ED Notes (Signed)
Bed: GY69 Expected date:  Expected time:  Means of arrival:  Comments: 83 yo M/ Fall-Right rib pain

## 2016-11-19 NOTE — Assessment & Plan Note (Signed)
Bone mets Treated with Firmagon since  10/22/2010 and subsequently Lupron on 11/27/2010 and has been on it since that time.  Casodex added on 01/06/2013. He started [place] in June of 2012 10/28/16 urology Lupron q 4 months, PSA testosterone bone scan Foley

## 2016-11-19 NOTE — Assessment & Plan Note (Signed)
11/19/16 ED eval for fall, left arm skin tear, CT head/cervical spine/left hip: no acute process, incidental finding right upper lobe mass, recommended PET chest. Will recommend Pulmonology consultation.

## 2016-11-22 ENCOUNTER — Encounter (HOSPITAL_COMMUNITY)
Admission: RE | Admit: 2016-11-22 | Discharge: 2016-11-22 | Disposition: A | Discharge status: Home / Self Care | Payer: Medicare Other | Source: Ambulatory Visit | Attending: Urology | Admitting: Urology

## 2016-11-22 DIAGNOSIS — C61 Malignant neoplasm of prostate: Secondary | ICD-10-CM | POA: Diagnosis present

## 2016-11-22 DIAGNOSIS — Z96651 Presence of right artificial knee joint: Secondary | ICD-10-CM | POA: Diagnosis not present

## 2016-11-22 DIAGNOSIS — R937 Abnormal findings on diagnostic imaging of other parts of musculoskeletal system: Secondary | ICD-10-CM | POA: Diagnosis not present

## 2016-11-22 MED ORDER — TECHNETIUM TC 99M MEDRONATE IV KIT
22.0000 | PACK | Freq: Once | INTRAVENOUS | Status: AC | PRN
  Administered 2016-11-22: 22 via INTRAVENOUS

## 2016-11-25 ENCOUNTER — Encounter: Payer: Self-pay | Admitting: Nurse Practitioner

## 2016-11-25 DIAGNOSIS — C799 Secondary malignant neoplasm of unspecified site: Secondary | ICD-10-CM | POA: Insufficient documentation

## 2016-11-26 ENCOUNTER — Encounter: Payer: Self-pay | Admitting: Nurse Practitioner

## 2016-11-26 ENCOUNTER — Other Ambulatory Visit: Payer: Self-pay | Admitting: *Deleted

## 2016-11-26 DIAGNOSIS — R627 Adult failure to thrive: Secondary | ICD-10-CM | POA: Insufficient documentation

## 2016-12-02 ENCOUNTER — Encounter: Payer: Self-pay | Admitting: Internal Medicine

## 2016-12-02 ENCOUNTER — Non-Acute Institutional Stay (SKILLED_NURSING_FACILITY): Payer: Medicare Other | Admitting: Internal Medicine

## 2016-12-02 DIAGNOSIS — R413 Other amnesia: Secondary | ICD-10-CM

## 2016-12-02 DIAGNOSIS — C799 Secondary malignant neoplasm of unspecified site: Secondary | ICD-10-CM | POA: Diagnosis not present

## 2016-12-02 DIAGNOSIS — R634 Abnormal weight loss: Secondary | ICD-10-CM | POA: Diagnosis not present

## 2016-12-02 DIAGNOSIS — R627 Adult failure to thrive: Secondary | ICD-10-CM

## 2016-12-02 DIAGNOSIS — C61 Malignant neoplasm of prostate: Secondary | ICD-10-CM

## 2016-12-02 DIAGNOSIS — I10 Essential (primary) hypertension: Secondary | ICD-10-CM

## 2016-12-02 NOTE — Progress Notes (Signed)
Progress Note    Location:  [place] Room Number: N39 Place of Service:  SNF [phone]) Provider:  [name], MD  Patient Care Team: Estill Dooms, MD as PCP - General (Internal Medicine) Wyatt Portela, MD (Hematology and Oncology) Franchot Gallo, MD as Attending Physician (Urology) Gaynelle Arabian, MD as Consulting Physician (Orthopedic Surgery) Kristeen Miss, MD as Consulting Physician (Neurosurgery) Man Otho Darner, NP as Nurse Practitioner (Internal Medicine)  Extended Emergency Contact Information Primary Emergency Contact: [name],[name] Address: [address]          [place], [place] [address] [place] Phone: [phone] Work Phone: [phone] Mobile Phone: [phone] Relation: Daughter Secondary Emergency Contact: [place] Phone: [phone] Mobile Phone: [phone] Relation: Son  Code Status:  DNR Goals of care: Advanced Directive information Advanced Directives 12/02/2016  Does Patient Have a Medical Advance Directive? Yes  Type of Paramedic of [place];Living will;Out of facility DNR (pink MOST or yellow form)  Does patient want to make changes to medical advance directive? -  Copy of [place] in Chart? Yes  Would patient like information on creating a medical advance directive? -  Pre-existing out of facility DNR order (yellow form or pink MOST form) Yellow form placed in chart (order not valid for inpatient use)     Chief Complaint  Patient presents with  . Medical Management of Chronic Issues    90 day routine visit.   . Fall    11/19/16 patient fell, hit his head when to [facility] ED    HPI:  Pt is a 83 y.o. male seen today for medical management of chronic diseases.    Metastatic disease ([place]) - known Ca prostate since Jan 2012. Nuclear bone scan done 11/22/16 showed dramatic increase in metastatic disease. Has been followed by Dr.  Diona Fanti and Dr. Alen Blew. Saw Dr. Diona Fanti 10/28/16 and is scheduled to see Dr. Alen Blew 02/04/17. Treatment has included Norberta Keens, Lupron, Casodex, and Trelstar  Prostate cancer ([place]) - increase in metastatic disease  Memory deficit - conversational, but confused today. Thought someone told him to be up by 2AM for some sort of visitation. Says he is exhausted.  Loss of weight  Essential hypertension  Adult failure to thrive     Past Medical History:  Diagnosis Date  . Anxiety state, unspecified   . CAD (coronary artery disease) 11/20/13  . Carbon monoxide exposure 11/24/13  . Cervicalgia   . Degenerative disc disease   . Degenerative joint disease   . Diverticulitis   . Diverticulosis   . Ejection fraction   . Genital herpes   . Genital herpes 07/10/2013  . GERD (gastroesophageal reflux disease)   . Hip arthritis 09/29/2013   Bilateral  . Hypertension   . Hyponatremia 11/24/13  . Hypoxia 11/24/13  . Insomnia 07/09/2013  . Lumbago   . Lumbosacral spondylolysis   . Lumbosacral spondylosis without myelopathy 09/29/2013  . Malignant neoplasm of prostate ([place])   . NSTEMI (non-ST elevated myocardial infarction) ([place]) 11/20/13  . OA (osteoarthritis) of knee 06/21/2013  . Other and unspecified hyperlipidemia   . Pneumonia 11/24/13  . Postoperative anemia due to acute blood loss 06/22/2013  . Prostate cancer ([place]) 2012   mets to bone  . TIA (transient ischemic attack)    Past Surgical History:  Procedure Laterality Date  . CATARACT EXTRACTION Bilateral   . MASTOIDECTOMY Bilateral   . TONSILLECTOMY    .  TOTAL KNEE ARTHROPLASTY Right 06/21/2013   Procedure: RIGHT TOTAL KNEE ARTHROPLASTY;  Surgeon: Gearlean Alf, MD;  Location: [facility] ORS;  Service: Orthopedics;  Laterality: Right;. Dr. Wynelle Link    Allergies  Allergen Reactions  . Penicillins Other (See Comments)    Patient doesn't remember what kind of reaction  Has patient had a PCN reaction causing immediate rash,  facial/tongue/throat swelling, SOB or lightheadedness with hypotension: n/a Has patient had a PCN reaction causing severe rash involving mucus membranes or skin necrosis:  N/a Has patient had a PCN reaction that required hospitalization: n/a Has patient had a PCN reaction occurring within the last 10 years: n/a If all of the above answers are "NO", then may proceed with Cephalosporin use.     Allergies as of 12/02/2016      Reactions   Penicillins Other (See Comments)   Patient doesn't remember what kind of reaction Has patient had a PCN reaction causing immediate rash, facial/tongue/throat swelling, SOB or lightheadedness with hypotension: n/a Has patient had a PCN reaction causing severe rash involving mucus membranes or skin necrosis:  N/a Has patient had a PCN reaction that required hospitalization: n/a Has patient had a PCN reaction occurring within the last 10 years: n/a If all of the above answers are "NO", then may proceed with Cephalosporin use.      Medication List       Accurate as of 12/02/16 10:56 AM. Always use your most recent med list.          acetaminophen 500 MG tablet Commonly known as:  TYLENOL Take 1,000 mg by mouth at bedtime.   ALIGN 4 MG Caps Take 4 mg by mouth daily.   B-complex with vitamin C tablet Take 1 tablet by mouth every morning.   BENGAY COLD THERAPY 5 % Gel Generic drug:  Menthol (Topical Analgesic) Apply topically. Apply as needed for discomfort to neck area.   bicalutamide 50 MG tablet Commonly known as:  CASODEX Take 1 tablet by mouth. Take one tablet daily   chlorhexidine 0.12 % solution Commonly known as:  PERIDEX Use as directed 15 mLs in the mouth or throat daily as needed (mouth rinse).   clopidogrel 75 MG tablet Commonly known as:  PLAVIX Take 75 mg by mouth every morning.   famotidine 20 MG tablet Commonly known as:  PEPCID Take 20 mg by mouth daily. For heartburn   feeding supplement (ENSURE ENLIVE) Liqd Take 237 mLs  by mouth 3 (three) times daily between meals.   lisinopril 40 MG tablet Commonly known as:  PRINIVIL,ZESTRIL Take 0.5 tablets (20 mg total) by mouth daily.   LORazepam 0.5 MG tablet Commonly known as:  ATIVAN Take 1 tablet (0.5 mg total) by mouth at bedtime as needed for sleep.   memantine 5 MG tablet Commonly known as:  NAMENDA Take 5 mg by mouth daily.   metoprolol tartrate 25 MG tablet Commonly known as:  LOPRESSOR Take 1 tablet (25 mg total) by mouth 2 (two) times daily.   mirtazapine 15 MG tablet Commonly known as:  REMERON Take 15 mg by mouth. Take one tablet daily   polyethylene glycol packet Commonly known as:  MIRALAX / GLYCOLAX Take 17 g by mouth daily as needed.   PRESERVISION AREDS 2+MULTI VIT Caps Take by mouth. Take one tablet twice daily   SYSTANE 0.4-0.3 % Soln Generic drug:  Polyethyl Glycol-Propyl Glycol Apply 1 drop to eye daily.   zinc oxide 20 % ointment Apply 1 application topically. Apply  to buttocks three times daily as needed for redness       Review of Systems  Constitutional: Positive for activity change, appetite change, fatigue and unexpected weight change. Negative for fever.  HENT: Negative for congestion, ear pain, hearing loss, rhinorrhea, sore throat, tinnitus, trouble swallowing and voice change.   Eyes: Positive for visual disturbance (corrective lenses).       Corrective lenses  Respiratory: Negative for cough, choking, chest tightness, shortness of breath and wheezing.   Cardiovascular: Negative for chest pain, palpitations and leg swelling.       CAD with hx MI mar 2015  Gastrointestinal: Negative for abdominal distention, abdominal pain, constipation, diarrhea and nausea.       Poor appetite.  Endocrine: Negative for cold intolerance, heat intolerance, polydipsia, polyphagia and polyuria.  Genitourinary: Positive for difficulty urinating and frequency. Negative for dysuria, testicular pain and urgency.       Hx prostate  cancer. UTI with sepsis in jan 2018. Foley, dysuria  Musculoskeletal: Negative for arthralgias, back pain, gait problem, myalgias and neck pain.       SP right knee replacement  Skin: Negative for color change, pallor and rash.  Allergic/Immunologic: Negative.   Neurological: Negative for dizziness, tremors, syncope, speech difficulty, weakness, numbness and headaches.  Hematological: Negative for adenopathy. Does not bruise/bleed easily.  Psychiatric/Behavioral: Positive for confusion and decreased concentration. Negative for behavioral problems, hallucinations and sleep disturbance. The patient is not nervous/anxious.     Immunization History  Administered Date(s) Administered  . Tdap 05/05/2013   Pertinent  Health Maintenance Due  Topic Date Due  . FOOT EXAM  06/28/1936  . OPHTHALMOLOGY EXAM  06/28/1936  . PNA vac Low Risk Adult (1 of 2 - PCV13) 06/28/1993  . HEMOGLOBIN A1C  12/23/2013  . INFLUENZA VACCINE  03/26/2017   No flowsheet data found.   Vitals:   12/02/16 1045  BP: (!) 142/88  Pulse: (!) 59  Resp: 14  Temp: 98.8 F (37.1 C)  SpO2: 98%  Weight: 137 lb (62.1 kg)  Height: 5\' 9"  (1.753 m)   Body mass index is 20.23 kg/m.  Wt Readings from Last 3 Encounters:  12/02/16 137 lb (62.1 kg)  11/19/16 142 lb 9.6 oz (64.7 kg)  11/11/16 142 lb 9.6 oz (64.7 kg)    Physical Exam  Constitutional: He appears well-developed and well-nourished. No distress.  HENT:  Right Ear: External ear normal.  Left Ear: External ear normal.  Nose: Nose normal.  Mouth/Throat: Oropharynx is clear and moist. No oropharyngeal exudate.  Eyes: Conjunctivae and EOM are normal. Pupils are equal, round, and reactive to light.  Neck: No JVD present. No tracheal deviation present. No thyromegaly present.  Cardiovascular: Normal rate, regular rhythm, normal heart sounds and intact distal pulses.  Exam reveals no gallop and no friction rub.   No murmur heard. Pulmonary/Chest: No respiratory  distress. He has no wheezes. He has rales. He exhibits no tenderness.  Abdominal: He exhibits no distension and no mass. There is no tenderness.  Genitourinary:  Genitourinary Comments: Foley  Musculoskeletal: Normal range of motion. He exhibits no edema or tenderness.  unstable on standiing  Lymphadenopathy:    He has no cervical adenopathy.  Neurological: He is alert. He has normal reflexes. No cranial nerve deficit. Coordination normal.  Intention tremor in both arms  Skin: No rash noted. No erythema. No pallor.  Psychiatric: He has a normal mood and affect. His behavior is normal. Judgment and thought content normal.  Labs reviewed:  Recent Labs  09/10/16 1325 09/10/16 1854 09/11/16 0030 11/09/16 11/15/16 11/19/16  NA 137 137 136 137 137 136*  K 4.0 4.2 3.9 3.9 5.0 4.3  CL 102 98* 102  --   --   --   CO2 26 28 22   --   --   --   GLUCOSE 92 119* 97  --   --   --   BUN 25* 26* 26* 30* 32* 29*  CREATININE 1.34* 1.20 1.23 1.1 1.4* 1.4*  CALCIUM 8.9 9.3 8.9  --   --   --     Recent Labs  09/10/16 1325 09/10/16 1854 11/09/16 11/19/16  AST 22 24 34 27  ALT 14* 16* 23 24  ALKPHOS 59 69 75 74  BILITOT 0.8 0.7  --   --   PROT 6.1* 7.2  --   --   ALBUMIN 3.6 4.1  --   --     Recent Labs  09/10/16 1325 09/10/16 1854 09/11/16 0030  WBC 12.2* 13.4* 12.2*  NEUTROABS 8.6* 9.4*  --   HGB 13.3 14.6 15.0  HCT 39.5 42.9 43.7  MCV 97.5 97.3 96.3  PLT 171 178 158   Lab Results  Component Value Date   TSH 1.12 11/11/2016   Lab Results  Component Value Date   HGBA1C 5.5 06/24/2013   Lab Results  Component Value Date   CHOL 132 11/22/2013   HDL 77 11/22/2013   LDLCALC 44 11/22/2013   TRIG 55 11/22/2013   CHOLHDL 1.7 11/22/2013    Significant Diagnostic Results in last 30 days:  Ct Head Wo Contrast  Result Date: 11/19/2016 CLINICAL DATA:  Unwitnessed fall at nursing home. EXAM: CT HEAD WITHOUT CONTRAST CT CERVICAL SPINE WITHOUT CONTRAST TECHNIQUE: Multidetector  CT imaging of the head and cervical spine was performed following the standard protocol without intravenous contrast. Multiplanar CT image reconstructions of the cervical spine were also generated. COMPARISON:  Most recent head CT 09/10/2016 FINDINGS: CT HEAD FINDINGS Brain: Stable atrophy and chronic small vessel ischemia. No hemorrhage or evidence of acute infarct. No mass effect, midline shift, or hydrocephalus. No extra-axial fluid collection. Scattered basal gangliar calcifications are incidentally noted. Vascular: Atherosclerosis of skullbase vasculature without hyperdense vessel or abnormal calcification. Skull: No acute fracture. Stable sclerotic density in the left parietal bone. Sinuses/Orbits: Bilateral mastoidectomies. No acute inflammation. Paranasal sinuses are clear. Other: None. CT CERVICAL SPINE FINDINGS Alignment: Straightening of normal lordosis. No jumped or perched facets. Lateral masses of C1 are well aligned on C2. Skull base and vertebrae: No acute fracture. Skullbase and dens are intact. Endplate sclerosis at multiple levels secondary to degenerative disc disease. Soft tissues and spinal canal: No prevertebral fluid or swelling. No visible canal hematoma. Disc levels: Advanced degenerative disc disease throughout cervical spine. Near complete loss of disc height at C4-C5, C7-T1, and T1-T2. Advanced multilevel facet arthropathy. Multilevel neural foraminal stenosis. Upper chest: Spiculated 2.3 x 1.1 cm right apical nodule (when measured on coronal reformats), new from chest CT 11/21/2013. Apical emphysema. Other: Carotid calcifications. IMPRESSION: 1. Stable atrophy and chronic small vessel ischemia. No acute intracranial abnormality. 2. Advanced multilevel degenerative disc disease and facet arthropathy throughout cervical spine. No evidence of acute fracture or subluxation. 3. Spiculated 2.3 x 1.1 cm right apical nodule, new from March 2015 chest CT. This is concerning for primary  bronchogenic malignancy given underlying emphysema. Consider PET-CT for metabolic characterization. Electronically Signed   By: Jeb Levering M.D.   On:  11/19/2016 02:06   Ct Cervical Spine Wo Contrast  Result Date: 11/19/2016 CLINICAL DATA:  Unwitnessed fall at nursing home. EXAM: CT HEAD WITHOUT CONTRAST CT CERVICAL SPINE WITHOUT CONTRAST TECHNIQUE: Multidetector CT imaging of the head and cervical spine was performed following the standard protocol without intravenous contrast. Multiplanar CT image reconstructions of the cervical spine were also generated. COMPARISON:  Most recent head CT 09/10/2016 FINDINGS: CT HEAD FINDINGS Brain: Stable atrophy and chronic small vessel ischemia. No hemorrhage or evidence of acute infarct. No mass effect, midline shift, or hydrocephalus. No extra-axial fluid collection. Scattered basal gangliar calcifications are incidentally noted. Vascular: Atherosclerosis of skullbase vasculature without hyperdense vessel or abnormal calcification. Skull: No acute fracture. Stable sclerotic density in the left parietal bone. Sinuses/Orbits: Bilateral mastoidectomies. No acute inflammation. Paranasal sinuses are clear. Other: None. CT CERVICAL SPINE FINDINGS Alignment: Straightening of normal lordosis. No jumped or perched facets. Lateral masses of C1 are well aligned on C2. Skull base and vertebrae: No acute fracture. Skullbase and dens are intact. Endplate sclerosis at multiple levels secondary to degenerative disc disease. Soft tissues and spinal canal: No prevertebral fluid or swelling. No visible canal hematoma. Disc levels: Advanced degenerative disc disease throughout cervical spine. Near complete loss of disc height at C4-C5, C7-T1, and T1-T2. Advanced multilevel facet arthropathy. Multilevel neural foraminal stenosis. Upper chest: Spiculated 2.3 x 1.1 cm right apical nodule (when measured on coronal reformats), new from chest CT 11/21/2013. Apical emphysema. Other: Carotid  calcifications. IMPRESSION: 1. Stable atrophy and chronic small vessel ischemia. No acute intracranial abnormality. 2. Advanced multilevel degenerative disc disease and facet arthropathy throughout cervical spine. No evidence of acute fracture or subluxation. 3. Spiculated 2.3 x 1.1 cm right apical nodule, new from March 2015 chest CT. This is concerning for primary bronchogenic malignancy given underlying emphysema. Consider PET-CT for metabolic characterization. Electronically Signed   By: Jeb Levering M.D.   On: 11/19/2016 02:06   Nm Bone Scan Whole Body  Result Date: 11/22/2016 CLINICAL DATA:  Prostate carcinoma EXAM: NUCLEAR MEDICINE WHOLE BODY BONE SCAN TECHNIQUE: Whole body anterior and posterior images were obtained approximately 3 hours after intravenous injection of radiopharmaceutical. RADIOPHARMACEUTICALS:  22.0 mCi Technetium-105m MDP IV COMPARISON:  Dec 27, 2015 FINDINGS: There is now extensive abnormal radiotracer uptake throughout much of the skeleton. Specifically, there is abnormal radiotracer uptake in the lower cervical spine, throughout most of the thoracic spine, most severe between T8 and T10, as well as in the lumbar region, most severe at L1 but involving portions the posterior elements at L2 and L3. On the prior study, the only abnormal uptake of radiotracer was noted at T10. In addition, there is now abnormal radiotracer uptake throughout multiple sites in the sacrum as well as through out multiple ribs bilaterally. There is abnormal uptake in the iliac crests bilaterally as well as in both sacroiliac joints. There is abnormal uptake in the right acetabulum and right femoral head regions. There is a Foley catheter decompressing the bladder. Kidneys are noted in the flank positions bilaterally. There is contaminated urine extending from the Foley catheter into effect overlying the left knee region. There is photopenia from a total knee replacement on the right. IMPRESSION: There has  been dramatic change from prior study. There is now extensive abnormal radiotracer uptake throughout the axial skeleton consistent with widespread prostate carcinoma metastasis. Kidneys are noted bilaterally. Total knee replacement noted on the right with photopenia in this area. Electronically Signed   By: Lowella Grip III M.D.  On: 11/22/2016 14:45   Ct Hip Left Wo Contrast  Result Date: 11/19/2016 CLINICAL DATA:  Left hip pain after fall. EXAM: CT OF THE LEFT HIP WITHOUT CONTRAST TECHNIQUE: Multidetector CT imaging of the left hip was performed according to the standard protocol. Multiplanar CT image reconstructions were also generated. COMPARISON:  Radiographs earlier this day. FINDINGS: Bones/Joint/Cartilage No acute fracture. Femoral head well seated in the acetabulum. Moderate hip joint osteoarthritis with joint space narrowing and subchondral cysts. Pubic rami are intact. Pubic symphysis is congruent. Faint sclerotic densities within the left iliac bone are similar to prior abdominal CT. Ligaments Suboptimally assessed by CT. Muscles and Tendons No large muscular hematoma.  Muscle bulk is preserved. Soft tissues Foley catheter in the urinary bladder with small bladder wall thickening. Vascular calcifications. Left testicle high-riding in the inguinal canal. No large soft tissue hematoma. IMPRESSION: 1. No acute fracture of the left hip. 2. Moderate left hip osteoarthritis. Electronically Signed   By: Jeb Levering M.D.   On: 11/19/2016 03:22   Dg Hip Unilat W Or Wo Pelvis 2-3 Views Left  Result Date: 11/19/2016 CLINICAL DATA:  Status post fall, with left lateral hip pain. Initial encounter. EXAM: DG HIP (WITH OR WITHOUT PELVIS) 2-3V LEFT COMPARISON:  None. FINDINGS: There is no evidence of fracture or dislocation. Axial joint space narrowing is noted at both hips. The proximal left femur appears intact. Degenerative change is noted along the lower lumbar spine. The sacroiliac joints are  unremarkable in appearance. The visualized bowel gas pattern is grossly unremarkable in appearance. Diffuse vascular calcifications are seen. IMPRESSION: 1. No definite evidence of fracture or dislocation. 2. Axial joint space narrowing at both hips. 3. Degenerative change along the lower lumbar spine. 4. Diffuse vascular calcifications seen. Electronically Signed   By: Garald Balding M.D.   On: 11/19/2016 01:26    Assessment/Plan 1. Metastatic disease ([place]) Will correspond with Drs. Dahlstedt and Shadad  2. Prostate cancer [facility]) Will correspond with Drs. Dahlstedt and Shadad  3. Memory deficit Confused and possible nocturnal delirium. CT brain 11/19/16 did not show evidence of metastatic disease. He has atrophy and small vessel disease.  4. Loss of weight Possibly related to metastatic disease and weight loss.  5. Essential hypertension cntrolled  6. Adult failure to thrive Weight loss, anorexia, increased metastatic disease. Was started on mirtazapine on 11/25/16.

## 2016-12-05 ENCOUNTER — Encounter: Payer: Self-pay | Admitting: *Deleted

## 2016-12-09 ENCOUNTER — Telehealth: Payer: Self-pay | Admitting: Oncology

## 2016-12-09 NOTE — Telephone Encounter (Signed)
Appointments scheduled per in-basket scheduling message. Patient notified.

## 2016-12-11 ENCOUNTER — Telehealth: Payer: Self-pay | Admitting: *Deleted

## 2016-12-11 NOTE — Telephone Encounter (Signed)
FYI "This is Craig [name] calling for my dad.  He lives at [facility].  Dr. Diona Fanti said we need to call the cancer center to talk with Dr. Alen Blew.  His cancer has spread to his entire t"  Notified Lattie Haw of appointment scheduled 12-23-2016 at 9:30 am.  "Thanks, I was not aware of this appointment.  I think everything is taken care of.  Call me if needed [phone]."

## 2016-12-23 ENCOUNTER — Telehealth: Payer: Self-pay | Admitting: *Deleted

## 2016-12-23 ENCOUNTER — Ambulatory Visit (HOSPITAL_BASED_OUTPATIENT_CLINIC_OR_DEPARTMENT_OTHER): Payer: Medicare Other | Admitting: Oncology

## 2016-12-23 VITALS — BP 111/61 | HR 51 | Resp 16 | Ht 69.0 in | Wt 137.7 lb

## 2016-12-23 DIAGNOSIS — C7951 Secondary malignant neoplasm of bone: Secondary | ICD-10-CM | POA: Diagnosis not present

## 2016-12-23 DIAGNOSIS — E291 Testicular hypofunction: Secondary | ICD-10-CM

## 2016-12-23 DIAGNOSIS — C61 Malignant neoplasm of prostate: Secondary | ICD-10-CM

## 2016-12-23 NOTE — Progress Notes (Signed)
Hematology and Oncology Follow Up Visit  Craig [name] [id number] 1926-07-28 83 y.o. 12/23/2016 10:25 AM   Principle Diagnosis: 83 year old gentleman with prostate cancer diagnosed in January of 2012. It a Gleason score 4+5 = 9.  Clinical stage TIIC. He has advanced disease to the bone   Current therapy:  He has been on [place] since  10/22/2010 and subsequently Lupron on 11/27/2010 and has been on it since that time.  Casodex added on 01/06/2013. He started [place] in June of 2012  Interim History:  Craig [name] presents today for a followup visit with his daughter. Since his last visit, he has reported a further decline in his overall health. He is more debilitated and wheelchair bound. He is reporting more diffuse pain in his hips, lower back and has reported poor appetite. Bone scan obtained on 11/22/2016 showed diffuse bony metastasis at this time. His overall quality of life has been very poor. He has not reported any pathological fractures, recent hospitalizations or falls.   He does not report any headaches, blurry vision, syncope or seizures. He does not report any fevers, chills or sweats. He does not report any cough, wheezing or hemoptysis. Does not report any nausea, vomiting, abdominal pain. He does not report any constipation or diarrhea. He does not report any frequency urgency or hesitancy. Does not report any lymphadenopathy or petechiae. Remaining review of systems unremarkable.  Medications: I have reviewed the patient's current medications.  Current Outpatient Prescriptions  Medication Sig Dispense Refill  . acetaminophen (TYLENOL) 500 MG tablet Take 1,000 mg by mouth at bedtime.    . B Complex-C (B-COMPLEX WITH VITAMIN C) tablet Take 1 tablet by mouth every morning.     . bicalutamide (CASODEX) 50 MG tablet Take 1 tablet by mouth. Take one tablet daily    . chlorhexidine (PERIDEX) 0.12 % solution Use as directed 15 mLs in the mouth or throat daily as needed (mouth  rinse).    Marland Kitchen clopidogrel (PLAVIX) 75 MG tablet Take 75 mg by mouth every morning.     . famotidine (PEPCID) 20 MG tablet Take 20 mg by mouth daily. For heartburn    . feeding supplement, ENSURE ENLIVE, (ENSURE ENLIVE) LIQD Take 237 mLs by mouth 3 (three) times daily between meals. 237 mL 12  . ipratropium-albuterol (DUONEB) 0.5-2.5 (3) MG/3ML SOLN     . lisinopril (PRINIVIL,ZESTRIL) 40 MG tablet Take 0.5 tablets (20 mg total) by mouth daily. 90 tablet 3  . LORazepam (ATIVAN) 0.5 MG tablet Take 1 tablet (0.5 mg total) by mouth at bedtime as needed for sleep. 10 tablet 0  . memantine (NAMENDA) 5 MG tablet Take 5 mg by mouth daily.    . Menthol, Topical Analgesic, (BENGAY COLD THERAPY) 5 % GEL Apply topically. Apply as needed for discomfort to neck area.    . metoprolol tartrate (LOPRESSOR) 25 MG tablet Take 1 tablet (25 mg total) by mouth 2 (two) times daily. 60 tablet   . mirtazapine (REMERON) 15 MG tablet Take 15 mg by mouth. Take one tablet daily    . Multiple Vitamins-Minerals (PRESERVISION AREDS 2+MULTI VIT) CAPS Take by mouth. Take one tablet twice daily    . Polyethyl Glycol-Propyl Glycol (SYSTANE) 0.4-0.3 % SOLN Apply 1 drop to eye daily.    . polyethylene glycol (MIRALAX / GLYCOLAX) packet Take 17 g by mouth daily as needed.    . Probiotic Product (ALIGN) 4 MG CAPS Take 4 mg by mouth daily.    Marland Kitchen zinc oxide 20 %  ointment Apply 1 application topically. Apply to buttocks three times daily as needed for redness     No current facility-administered medications for this visit.     Allergies:  Allergies  Allergen Reactions  . Penicillins Other (See Comments)    Patient doesn't remember what kind of reaction  Has patient had a PCN reaction causing immediate rash, facial/tongue/throat swelling, SOB or lightheadedness with hypotension: n/a Has patient had a PCN reaction causing severe rash involving mucus membranes or skin necrosis:  N/a Has patient had a PCN reaction that required  hospitalization: n/a Has patient had a PCN reaction occurring within the last 10 years: n/a If all of the above answers are "NO", then may proceed with Cephalosporin use.     Past Medical History, Surgical history, Social history, and Family History were reviewed and updated.    Physical Exam: Blood pressure 111/61, pulse (!) 51, resp. rate 16, height 5\' 9"  (1.753 m), weight 137 lb 11.2 oz (62.5 kg), SpO2 95 %. ECOG: 3 General appearance: Alert, awake gentleman chronically ill-appearing appeared without distress. Head: Normocephalic, without obvious abnormality no oral ulcers or lesions. Neck: no adenopathy Lymph nodes: Cervical, supraclavicular, and axillary nodes normal. Heart:regular rate and rhythm, S1, S2 normal, no murmur, click, rub or gallop Lung:chest clear, no wheezing, rales, normal symmetric air entry. Abdomen: soft, non-tender, without masses or organomegaly no shifting dullness or ascites. EXT:no erythema, induration, or nodules   Lab Results: Lab Results  Component Value Date   WBC 12.2 (H) 09/11/2016   HGB 15.0 09/11/2016   HCT 43.7 09/11/2016   MCV 96.3 09/11/2016   PLT 158 09/11/2016     Chemistry      Component Value Date/Time   NA 136 (A) 11/19/2016   NA 141 12/15/2012 0934   K 4.3 11/19/2016   K 4.0 12/15/2012 0934   CL 102 09/11/2016 0030   CL 100 12/15/2012 0934   CO2 22 09/11/2016 0030   CO2 30 (H) 12/15/2012 0934   BUN 29 (A) 11/19/2016   BUN 18.5 12/15/2012 0934   CREATININE 1.4 (A) 11/19/2016   CREATININE 1.23 09/11/2016 0030   CREATININE 0.96 06/04/2016 1508   CREATININE 1.0 12/15/2012 0934   GLU 86 11/19/2016      Component Value Date/Time   CALCIUM 8.9 09/11/2016 0030   CALCIUM 9.8 12/15/2012 0934   ALKPHOS 74 11/19/2016   ALKPHOS 37 (L) 12/15/2012 0934   AST 27 11/19/2016   AST 20 12/15/2012 0934   ALT 24 11/19/2016   ALT 19 12/15/2012 0934   BILITOT 0.7 09/10/2016 1854   BILITOT 0.65 12/15/2012 0934         Impression  and Plan:    83 year old gentleman with the following issues:  1. Advanced prostate cancer diagnosed in January 2012. He had a Gleason score 4+5 = 9 and at least 4 cores. All 12 cores were involved with his cancer. He had bony metastasis at the time. He was treated with androgen deprivation at this time.  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 His PSA in May 2017 was 0.64.  Bone scan obtained on 11/22/2016 showed diffuse bony metastasis. He has noted significant clinical decline and worsening quality of life and performance status.  His options of treatment were reviewed today. Aggressive approach would include starting second line hormonal therapy in the form of Zytiga, Xtandi or systemic chemotherapy. Radiation therapy can also be used for pain palliation. Alternatively, supportive care only and hospice  care would be also an option. Risks and benefits of these approaches were reviewed today. Complications associated with second line hormone therapy in the form of Xtandi specifically were reviewed as well as the goals of care in his case.  He understands that he has an incurable malignancy that could potentially be palliated by this therapy comes with a physical and financial price. After discussion today, he opted out of aggressive therapy and wanted to proceed with hospice care. I agree with his decision today and will make the appropriate referrals today.  I recommended discontinuation of his androgen deprivation therapy at this time.   2. Bony disease: He is currently on Xgeva. I have recommended discontinuation of this therapy.  3. Bony pain: He will be under the care of hospice for pain management standpoint.  4. Prognosis: Poor with limited life expectancy. He has incurable metastatic prostate cancer that is rapidly progressing with overall clinical decline and worsening performance status. He has limited life expectancy of less than 6  months roughly.  5. Follow-up: I'm happy to see him in the future as needed.  Khaleem Burchill 4/30/201810:25 AM

## 2016-12-23 NOTE — Telephone Encounter (Signed)
Per Dr. Alen Blew, I made a referral to [facility]. Anderson Malta at [facility] to call [facility] and set up Hospice.

## 2016-12-31 ENCOUNTER — Non-Acute Institutional Stay (SKILLED_NURSING_FACILITY): Payer: Medicare Other | Admitting: Nurse Practitioner

## 2016-12-31 DIAGNOSIS — I10 Essential (primary) hypertension: Secondary | ICD-10-CM | POA: Diagnosis not present

## 2016-12-31 DIAGNOSIS — G4700 Insomnia, unspecified: Secondary | ICD-10-CM

## 2016-12-31 DIAGNOSIS — R413 Other amnesia: Secondary | ICD-10-CM

## 2016-12-31 DIAGNOSIS — R339 Retention of urine, unspecified: Secondary | ICD-10-CM

## 2016-12-31 DIAGNOSIS — M47817 Spondylosis without myelopathy or radiculopathy, lumbosacral region: Secondary | ICD-10-CM | POA: Diagnosis not present

## 2016-12-31 DIAGNOSIS — K219 Gastro-esophageal reflux disease without esophagitis: Secondary | ICD-10-CM

## 2016-12-31 DIAGNOSIS — C799 Secondary malignant neoplasm of unspecified site: Secondary | ICD-10-CM

## 2016-12-31 DIAGNOSIS — I471 Supraventricular tachycardia: Secondary | ICD-10-CM

## 2016-12-31 DIAGNOSIS — R627 Adult failure to thrive: Secondary | ICD-10-CM

## 2016-12-31 DIAGNOSIS — C61 Malignant neoplasm of prostate: Secondary | ICD-10-CM

## 2016-12-31 NOTE — Assessment & Plan Note (Signed)
Controlled, continue Lisinopril and Metoprolol.

## 2016-12-31 NOTE — Assessment & Plan Note (Signed)
Heart rate is in control, continue Metoprolol  

## 2016-12-31 NOTE — Assessment & Plan Note (Signed)
Sleeps better, continue Mirtazapine.  

## 2016-12-31 NOTE — Assessment & Plan Note (Signed)
Off Namenda since 12/27/16

## 2016-12-31 NOTE — Progress Notes (Signed)
Location:      Place of Service:    Provider:  Vergia Chea, Manxie  NP  Estill Dooms, MD  Patient Care Team: Estill Dooms, MD as PCP - General (Internal Medicine) Wyatt Portela, MD (Hematology and Oncology) Franchot Gallo, MD as Attending Physician (Urology) Gaynelle Arabian, MD as Consulting Physician (Orthopedic Surgery) Kristeen Miss, MD as Consulting Physician (Neurosurgery) Raven Harmes X, NP as Nurse Practitioner (Internal Medicine)  Extended Emergency Contact Information Primary Emergency Contact: [name],[name] Address: [address]          [place], [place] [address] Johnnette Litter of [place] Phone: [phone] Work Phone: [phone] Mobile Phone: [phone] Relation: Daughter Secondary Emergency Contact: [place] of [place] Phone: [phone] Mobile Phone: [phone] Relation: Son  Code Status:  DNR Goals of care: Advanced Directive information Advanced Directives 12/23/2016  Does Patient Have a Medical Advance Directive? Yes  Type of Paramedic of [place];Out of facility DNR (pink MOST or yellow form)  Does patient want to make changes to medical advance directive? -  Copy of [place] in Chart? No - copy requested  Would patient like information on creating a medical advance directive? -  Pre-existing out of facility DNR order (yellow form or pink MOST form) -     No chief complaint on file.   HPI:  Pt is a 83 y.o. male seen today for evaluation of chronic medical conditions.   Hx of prostate cancer, f/u Urology, metastatic disease, under Hospice Service, taking Tylenol 1000mg  qhs.  GERD stable, on Famotine, memory, off Namenda, multiple sites OA is managed with Tylenol hs, prn Lorazepam, Mirtazapine 15mg  qhs is available at bedtime for sleep. Afib, heart rate is in controlled, taking Metoprolol, HTN controlled on Lisinopril 20mg , Hx of TIAs on Plavix and statin                 Past Medical History:  Diagnosis Date  . Anxiety state, unspecified   . CAD (coronary artery disease) 11/20/13  . Carbon monoxide exposure 11/24/13  . Cervicalgia   . Degenerative disc disease   . Degenerative joint disease   . Diverticulitis   . Diverticulosis   . Ejection fraction   . Genital herpes   . Genital herpes 07/10/2013  . GERD (gastroesophageal reflux disease)   . Hip arthritis 09/29/2013   Bilateral  . Hypertension   . Hyponatremia 11/24/13  . Hypoxia 11/24/13  . Insomnia 07/09/2013  . Lumbago   . Lumbosacral spondylolysis   . Lumbosacral spondylosis without myelopathy 09/29/2013  . Malignant neoplasm of prostate ([facility])   . NSTEMI (non-ST elevated myocardial infarction) ([facility]) 11/20/13  . OA (osteoarthritis) of knee 06/21/2013  . Other and unspecified hyperlipidemia   . Pneumonia 11/24/13  . Postoperative anemia due to acute blood loss 06/22/2013  . Prostate cancer ([facility]) 2012   mets to bone  . TIA (transient ischemic attack)    Past Surgical History:  Procedure Laterality Date  . CATARACT EXTRACTION Bilateral   . MASTOIDECTOMY Bilateral   . TONSILLECTOMY    . TOTAL KNEE ARTHROPLASTY Right 06/21/2013   Procedure: RIGHT TOTAL KNEE ARTHROPLASTY;  Surgeon: Gearlean Alf, MD;  Location: [facility];  Service: Orthopedics;  Laterality: Right;. Dr. Wynelle Link    Allergies  Allergen Reactions  . Penicillins Other (See Comments)    Patient doesn't remember what kind of reaction  Has patient had a PCN reaction causing immediate rash, facial/tongue/throat swelling, SOB or lightheadedness with  hypotension: n/a Has patient had a PCN reaction causing severe rash involving mucus membranes or skin necrosis:  N/a Has patient had a PCN reaction that required hospitalization: n/a Has patient had a PCN reaction occurring within the last 10 years: n/a If all of the above answers are "NO", then may proceed with Cephalosporin use.     Allergies as of 12/31/2016      Reactions    Penicillins Other (See Comments)   Patient doesn't remember what kind of reaction Has patient had a PCN reaction causing immediate rash, facial/tongue/throat swelling, SOB or lightheadedness with hypotension: n/a Has patient had a PCN reaction causing severe rash involving mucus membranes or skin necrosis:  N/a Has patient had a PCN reaction that required hospitalization: n/a Has patient had a PCN reaction occurring within the last 10 years: n/a If all of the above answers are "NO", then may proceed with Cephalosporin use.      Medication List       Accurate as of 12/31/16  2:12 PM. Always use your most recent med list.          acetaminophen 500 MG tablet Commonly known as:  TYLENOL Take 1,000 mg by mouth at bedtime.   ALIGN 4 MG Caps Take 4 mg by mouth daily.   B-complex with vitamin C tablet Take 1 tablet by mouth every morning.   BENGAY COLD THERAPY 5 % Gel Generic drug:  Menthol (Topical Analgesic) Apply topically. Apply as needed for discomfort to neck area.   bicalutamide 50 MG tablet Commonly known as:  CASODEX Take 1 tablet by mouth. Take one tablet daily   chlorhexidine 0.12 % solution Commonly known as:  PERIDEX Use as directed 15 mLs in the mouth or throat daily as needed (mouth rinse).   clopidogrel 75 MG tablet Commonly known as:  PLAVIX Take 75 mg by mouth every morning.   famotidine 20 MG tablet Commonly known as:  PEPCID Take 20 mg by mouth daily. For heartburn   feeding supplement (ENSURE ENLIVE) Liqd Take 237 mLs by mouth 3 (three) times daily between meals.   ipratropium-albuterol 0.5-2.5 (3) MG/3ML Soln Commonly known as:  DUONEB   lisinopril 40 MG tablet Commonly known as:  PRINIVIL,ZESTRIL Take 0.5 tablets (20 mg total) by mouth daily.   LORazepam 0.5 MG tablet Commonly known as:  ATIVAN Take 1 tablet (0.5 mg total) by mouth at bedtime as needed for sleep.   memantine 5 MG tablet Commonly known as:  NAMENDA Take 5 mg by mouth  daily.   metoprolol tartrate 25 MG tablet Commonly known as:  LOPRESSOR Take 1 tablet (25 mg total) by mouth 2 (two) times daily.   mirtazapine 15 MG tablet Commonly known as:  REMERON Take 15 mg by mouth. Take one tablet daily   polyethylene glycol packet Commonly known as:  MIRALAX / GLYCOLAX Take 17 g by mouth daily as needed.   PRESERVISION AREDS 2+MULTI VIT Caps Take by mouth. Take one tablet twice daily   SYSTANE 0.4-0.3 % Soln Generic drug:  Polyethyl Glycol-Propyl Glycol Apply 1 drop to eye daily.   zinc oxide 20 % ointment Apply 1 application topically. Apply to buttocks three times daily as needed for redness       Review of Systems  Constitutional: Positive for activity change, appetite change and fatigue. Negative for fever and unexpected weight change.  HENT: Positive for congestion. Negative for ear pain, hearing loss, rhinorrhea, sore throat, tinnitus, trouble swallowing and voice change.  Eyes: Positive for visual disturbance (corrective lenses).       Corrective lenses  Respiratory: Positive for cough. Negative for choking, chest tightness, shortness of breath and wheezing.   Cardiovascular: Negative for chest pain, palpitations and leg swelling.       CAD with hx MI mar 2015  Gastrointestinal: Negative for abdominal distention, abdominal pain, constipation, diarrhea and nausea.  Endocrine: Negative for cold intolerance, heat intolerance, polydipsia, polyphagia and polyuria.  Genitourinary: Positive for difficulty urinating and frequency. Negative for dysuria, testicular pain and urgency.       Hx prostate cancer. UTI with sepsis in jan 2018. Foley, dysuria  Musculoskeletal: Negative for arthralgias, back pain, gait problem, myalgias and neck pain.       SP right knee replacement  Skin: Negative for color change, pallor and rash.  Allergic/Immunologic: Negative.   Neurological: Negative for dizziness, tremors, syncope, speech difficulty, weakness,  numbness and headaches.  Hematological: Negative for adenopathy. Does not bruise/bleed easily.  Psychiatric/Behavioral: Negative for behavioral problems, confusion, decreased concentration, hallucinations and sleep disturbance. The patient is not nervous/anxious.     Immunization History  Administered Date(s) Administered  . Tdap 05/05/2013   Pertinent  Health Maintenance Due  Topic Date Due  . FOOT EXAM  06/28/1936  . OPHTHALMOLOGY EXAM  06/28/1936  . PNA vac Low Risk Adult (1 of 2 - PCV13) 06/28/1993  . HEMOGLOBIN A1C  12/23/2013  . INFLUENZA VACCINE  03/26/2017   No flowsheet data found. Functional Status Survey:    There were no vitals filed for this visit. There is no height or weight on file to calculate BMI. Physical Exam  Constitutional: He is oriented to person, place, and time. He appears well-developed and well-nourished. No distress.  HENT:  Right Ear: External ear normal.  Left Ear: External ear normal.  Nose: Nose normal.  Mouth/Throat: Oropharynx is clear and moist. No oropharyngeal exudate.  Eyes: Conjunctivae and EOM are normal. Pupils are equal, round, and reactive to light.  Neck: No JVD present. No tracheal deviation present. No thyromegaly present.  Cardiovascular: Normal rate, regular rhythm, normal heart sounds and intact distal pulses.  Exam reveals no gallop and no friction rub.   No murmur heard. Pulmonary/Chest: No respiratory distress. He has no wheezes. He has no rales. He exhibits no tenderness.  Scattered coarse rhonchi R+L lungs  Abdominal: He exhibits no distension and no mass. There is no tenderness.  Genitourinary:  Genitourinary Comments: Foley  Musculoskeletal: Normal range of motion. He exhibits no edema or tenderness.  unstable on standiing  Lymphadenopathy:    He has no cervical adenopathy.  Neurological: He is alert and oriented to person, place, and time. He has normal reflexes. No cranial nerve deficit. Coordination normal.   Intention tremor in both arms  Skin: No rash noted. No erythema. No pallor.  Left forearm skin tear, no s/s of infection.   Psychiatric: He has a normal mood and affect. His behavior is normal. Judgment and thought content normal.    Labs reviewed:  Recent Labs  09/10/16 1325 09/10/16 1854 09/11/16 0030 11/09/16 11/15/16 11/19/16  NA 137 137 136 137 137 136*  K 4.0 4.2 3.9 3.9 5.0 4.3  CL 102 98* 102  --   --   --   CO2 26 28 22   --   --   --   GLUCOSE 92 119* 97  --   --   --   BUN 25* 26* 26* 30* 32* 29*  CREATININE 1.34*  1.20 1.23 1.1 1.4* 1.4*  CALCIUM 8.9 9.3 8.9  --   --   --     Recent Labs  09/10/16 1325 09/10/16 1854 11/09/16 11/19/16  AST 22 24 34 27  ALT 14* 16* 23 24  ALKPHOS 59 69 75 74  BILITOT 0.8 0.7  --   --   PROT 6.1* 7.2  --   --   ALBUMIN 3.6 4.1  --   --     Recent Labs  09/10/16 1325 09/10/16 1854 09/11/16 0030  WBC 12.2* 13.4* 12.2*  NEUTROABS 8.6* 9.4*  --   HGB 13.3 14.6 15.0  HCT 39.5 42.9 43.7  MCV 97.5 97.3 96.3  PLT 171 178 158   Lab Results  Component Value Date   TSH 1.12 11/11/2016   Lab Results  Component Value Date   HGBA1C 5.5 06/24/2013   Lab Results  Component Value Date   CHOL 132 11/22/2013   HDL 77 11/22/2013   LDLCALC 44 11/22/2013   TRIG 55 11/22/2013   CHOLHDL 1.7 11/22/2013    Significant Diagnostic Results in last 30 days:  No results found.  Assessment/Plan Essential hypertension Controlled, continue Lisinopril and Metoprolol.   PSVT (paroxysmal supraventricular tachycardia) (HCC) Heart rate is in control, continue Metoprolol.   GERD (gastroesophageal reflux disease) Stable. Continue Famotidine.   Lumbosacral spondylosis without myelopathy Managed pain with Tylenol 1000mg  qhs  Prostate cancer ([facility]) Metastatic disease, Hospice service.   Urinary retention Continue Foley  Memory deficit Off Namenda since 12/27/16  Metastatic disease ([place]) 11/22/16 PET CT: There has been dramatic  change from prior study. There is now extensive abnormal radiotracer uptake throughout the axial skeleton consistent with widespread prostate carcinoma metastasis. Kidneys are noted bilaterally. Total knee replacement noted on the right with photopenia in this area.  Adult failure to thrive Persists, continue comfort measures.   Insomnia Sleeps better, continue Mirtazapine.        Family/ staff Communication: [facility] Hospice Service.   Labs/tests ordered:  none

## 2016-12-31 NOTE — Assessment & Plan Note (Signed)
11/22/16 PET CT: There has been dramatic change from prior study. There is now extensive abnormal radiotracer uptake throughout the axial skeleton consistent with widespread prostate carcinoma metastasis. Kidneys are noted bilaterally. Total knee replacement noted on the right with photopenia in this area.

## 2016-12-31 NOTE — Assessment & Plan Note (Signed)
Managed pain with Tylenol 1000mg  qhs

## 2016-12-31 NOTE — Assessment & Plan Note (Signed)
Continue Foley °

## 2016-12-31 NOTE — Assessment & Plan Note (Signed)
Persists, continue comfort measures.

## 2016-12-31 NOTE — Assessment & Plan Note (Signed)
Metastatic disease, Hospice service.

## 2016-12-31 NOTE — Assessment & Plan Note (Signed)
Stable. Continue Famotidine.

## 2017-01-14 ENCOUNTER — Non-Acute Institutional Stay (SKILLED_NURSING_FACILITY): Payer: Medicare Other | Admitting: Nurse Practitioner

## 2017-01-14 ENCOUNTER — Encounter: Payer: Self-pay | Admitting: Nurse Practitioner

## 2017-01-14 DIAGNOSIS — G4700 Insomnia, unspecified: Secondary | ICD-10-CM

## 2017-01-14 DIAGNOSIS — R413 Other amnesia: Secondary | ICD-10-CM | POA: Diagnosis not present

## 2017-01-14 DIAGNOSIS — R339 Retention of urine, unspecified: Secondary | ICD-10-CM | POA: Diagnosis not present

## 2017-01-14 DIAGNOSIS — K219 Gastro-esophageal reflux disease without esophagitis: Secondary | ICD-10-CM

## 2017-01-14 DIAGNOSIS — C61 Malignant neoplasm of prostate: Secondary | ICD-10-CM

## 2017-01-14 DIAGNOSIS — I471 Supraventricular tachycardia, unspecified: Secondary | ICD-10-CM

## 2017-01-14 DIAGNOSIS — R627 Adult failure to thrive: Secondary | ICD-10-CM

## 2017-01-14 DIAGNOSIS — C799 Secondary malignant neoplasm of unspecified site: Secondary | ICD-10-CM

## 2017-01-14 DIAGNOSIS — N390 Urinary tract infection, site not specified: Secondary | ICD-10-CM | POA: Diagnosis not present

## 2017-01-14 DIAGNOSIS — I10 Essential (primary) hypertension: Secondary | ICD-10-CM

## 2017-01-14 NOTE — Assessment & Plan Note (Signed)
Controlled, continue Lisinopril and Metoprolol.

## 2017-01-14 NOTE — Assessment & Plan Note (Signed)
Continue Foley °

## 2017-01-14 NOTE — Assessment & Plan Note (Signed)
Metastatic disease, Hospice service.

## 2017-01-14 NOTE — Assessment & Plan Note (Signed)
01/10/17 urine culture S. Maltophilia >100,000c/ml, 7 day course of Septra DS started, tolerated.

## 2017-01-14 NOTE — Progress Notes (Signed)
Location:  [facility] Room Number: 19 Place of Service:  SNF [phone]) Provider:  Ola Fawver, Manxie  NP  Estill Dooms, MD  Patient Care Team: Estill Dooms, MD as PCP - General (Internal Medicine) Wyatt Portela, MD (Hematology and Oncology) Franchot Gallo, MD as Attending Physician (Urology) Gaynelle Arabian, MD as Consulting Physician (Orthopedic Surgery) Kristeen Miss, MD as Consulting Physician (Neurosurgery) Micheala Morissette X, NP as Nurse Practitioner (Internal Medicine)  Extended Emergency Contact Information Primary Emergency Contact: [name],[name] Address: [address]          [place], [place] [address] [place] Phone: [phone] Work Phone: [phone] Mobile Phone: [phone] Relation: Daughter Secondary Emergency Contact: [place] Phone: [phone] Mobile Phone: [phone] Relation: Son  Code Status:  DNR Goals of care: Advanced Directive information Advanced Directives 01/14/2017  Does Patient Have a Medical Advance Directive? Yes  Type of Paramedic of [place];Living will;Out of facility DNR (pink MOST or yellow form)  Does patient want to make changes to medical advance directive? No - Patient declined  Copy of [place] in Chart? Yes  Would patient like information on creating a medical advance directive? -  Pre-existing out of facility DNR order (yellow form or pink MOST form) Yellow form placed in chart (order not valid for inpatient use)     Chief Complaint  Patient presents with  . Acute Visit    UTI    HPI:  Pt is a 83 y.o. male seen today for an acute visit for 01/10/17 urine culture S. Maltophilia >100,000c/ml, 7 day course of Septra DS started, tolerated.    Hx of prostate cancer, f/u Urology, metastatic disease, under Hospice Service. GERD stable, on Famotine, memory, off Namenda,  Comfort measures is provided  Mirtazapine 15mg   qhs is available at bedtime for sleep. Afib, heart rate is in controlled, taking Metoprolol, HTN controlled on Lisinopril 20mg , Hx of TIAs on Plavix and statin   Past Medical History:  Diagnosis Date  . Anxiety state, unspecified   . CAD (coronary artery disease) 11/20/13  . Carbon monoxide exposure 11/24/13  . Cervicalgia   . Degenerative disc disease   . Degenerative joint disease   . Diverticulitis   . Diverticulosis   . Ejection fraction   . Genital herpes   . Genital herpes 07/10/2013  . GERD (gastroesophageal reflux disease)   . Hip arthritis 09/29/2013   Bilateral  . Hypertension   . Hyponatremia 11/24/13  . Hypoxia 11/24/13  . Insomnia 07/09/2013  . Lumbago   . Lumbosacral spondylolysis   . Lumbosacral spondylosis without myelopathy 09/29/2013  . Malignant neoplasm of prostate ([place])   . NSTEMI (non-ST elevated myocardial infarction) ([place]) 11/20/13  . OA (osteoarthritis) of knee 06/21/2013  . Other and unspecified hyperlipidemia   . Pneumonia 11/24/13  . Postoperative anemia due to acute blood loss 06/22/2013  . Prostate cancer ([place] [facility]) 2012   mets to bone  . TIA (transient ischemic attack)    Past Surgical History:  Procedure Laterality Date  . CATARACT EXTRACTION Bilateral   . MASTOIDECTOMY Bilateral   . TONSILLECTOMY    . TOTAL KNEE ARTHROPLASTY Right 06/21/2013   Procedure: RIGHT TOTAL KNEE ARTHROPLASTY;  Surgeon: Gearlean Alf, MD;  Location: [facility];  Service: Orthopedics;  Laterality: Right;. Dr. Wynelle Link    Allergies  Allergen Reactions  . Penicillins Other (See Comments)    Patient doesn't remember what kind of  reaction  Has patient had a PCN reaction causing immediate rash, facial/tongue/throat swelling, SOB or lightheadedness with hypotension: n/a Has patient had a PCN reaction causing severe rash involving mucus membranes or skin necrosis:  N/a Has patient had a PCN reaction that required hospitalization: n/a Has patient had a PCN reaction occurring within the  last 10 years: n/a If all of the above answers are "NO", then may proceed with Cephalosporin use.     Outpatient Encounter Prescriptions as of 01/14/2017  Medication Sig  . acetaminophen (TYLENOL) 500 MG tablet Take 1,000 mg by mouth at bedtime.  Marland Kitchen aspirin 81 MG chewable tablet Chew 81 mg by mouth daily.  . chlorhexidine (PERIDEX) 0.12 % solution Use as directed 15 mLs in the mouth or throat daily as needed (mouth rinse).  . famotidine (PEPCID) 20 MG tablet Take 20 mg by mouth daily. For heartburn  . lisinopril (PRINIVIL,ZESTRIL) 40 MG tablet Take 0.5 tablets (20 mg total) by mouth daily.  Marland Kitchen LORazepam (ATIVAN) 0.5 MG tablet Take 1 tablet (0.5 mg total) by mouth at bedtime as needed for sleep.  . memantine (NAMENDA) 5 MG tablet Take 5 mg by mouth daily.  . Menthol, Topical Analgesic, (BENGAY COLD THERAPY) 5 % GEL Apply topically. Apply as needed for discomfort to neck area.  . metoprolol tartrate (LOPRESSOR) 25 MG tablet Take 1 tablet (25 mg total) by mouth 2 (two) times daily.  . mirtazapine (REMERON) 15 MG tablet Take 15 mg by mouth. Take one tablet daily  . Multiple Vitamins-Minerals (PRESERVISION AREDS 2+MULTI VIT) CAPS Take by mouth. Take one tablet twice daily  . Nutritional Supplements (FEEDING SUPPLEMENT, BOOST BREEZE,) LIQD Take 237 mLs by mouth 3 (three) times daily between meals as needed.  Vladimir Faster Glycol-Propyl Glycol (SYSTANE) 0.4-0.3 % SOLN Apply 1 drop to eye daily.  . polyethylene glycol (MIRALAX / GLYCOLAX) packet Take 17 g by mouth daily as needed.  . Probiotic Product (ALIGN) 4 MG CAPS Take 4 mg by mouth daily.  Marland Kitchen sulfamethoxazole-trimethoprim (BACTRIM,SEPTRA) 400-80 MG tablet Take 1 tablet by mouth 2 (two) times daily.  Marland Kitchen zinc oxide 20 % ointment Apply 1 application topically. Apply to buttocks three times daily as needed for redness  . [DISCONTINUED] B Complex-C (B-COMPLEX WITH VITAMIN C) tablet Take 1 tablet by mouth every morning.   . [DISCONTINUED] bicalutamide  (CASODEX) 50 MG tablet Take 1 tablet by mouth. Take one tablet daily  . [DISCONTINUED] clopidogrel (PLAVIX) 75 MG tablet Take 75 mg by mouth every morning.   . [DISCONTINUED] feeding supplement, ENSURE ENLIVE, (ENSURE ENLIVE) LIQD Take 237 mLs by mouth 3 (three) times daily between meals.  . [DISCONTINUED] ipratropium-albuterol (DUONEB) 0.5-2.5 (3) MG/3ML SOLN    No facility-administered encounter medications on file as of 01/14/2017.     Review of Systems  Constitutional: Positive for activity change, appetite change, fatigue and unexpected weight change. Negative for fever.  HENT: Positive for congestion. Negative for ear pain, hearing loss, rhinorrhea, sore throat, tinnitus, trouble swallowing and voice change.   Eyes: Positive for visual disturbance (corrective lenses).       Corrective lenses  Respiratory: Positive for cough. Negative for choking, chest tightness, shortness of breath and wheezing.   Cardiovascular: Negative for chest pain, palpitations and leg swelling.       CAD with hx MI mar 2015  Gastrointestinal: Negative for abdominal distention, abdominal pain, constipation, diarrhea and nausea.  Endocrine: Negative for cold intolerance, heat intolerance, polydipsia, polyphagia and polyuria.  Genitourinary: Positive for difficulty  urinating and frequency. Negative for dysuria, testicular pain and urgency.       Hx prostate cancer. UTI with sepsis in jan 2018. Foley, dysuria  Musculoskeletal: Negative for arthralgias, back pain, gait problem, myalgias and neck pain.       SP right knee replacement  Skin: Negative for color change, pallor and rash.  Allergic/Immunologic: Negative.   Neurological: Negative for dizziness, tremors, syncope, speech difficulty, weakness, numbness and headaches.  Hematological: Negative for adenopathy. Does not bruise/bleed easily.  Psychiatric/Behavioral: Negative for behavioral problems, confusion, decreased concentration, hallucinations and sleep  disturbance. The patient is not nervous/anxious.     Immunization History  Administered Date(s) Administered  . Tdap 05/05/2013   Pertinent  Health Maintenance Due  Topic Date Due  . FOOT EXAM  06/28/1936  . OPHTHALMOLOGY EXAM  06/28/1936  . PNA vac Low Risk Adult (1 of 2 - PCV13) 06/28/1993  . HEMOGLOBIN A1C  12/23/2013  . INFLUENZA VACCINE  03/26/2017   No flowsheet data found. Functional Status Survey:    Vitals:   01/14/17 1132  BP: 117/78  Pulse: 80  Resp: 20  Temp: 97.8 F (36.6 C)  SpO2: 97%  Weight: 137 lb 12.8 oz (62.5 kg)  Height: 5\' 9"  (1.753 m)   Body mass index is 20.35 kg/m. Physical Exam  Constitutional: He appears well-developed and well-nourished. No distress.  HENT:  Right Ear: External ear normal.  Left Ear: External ear normal.  Nose: Nose normal.  Mouth/Throat: Oropharynx is clear and moist. No oropharyngeal exudate.  Eyes: Conjunctivae and EOM are normal. Pupils are equal, round, and reactive to light.  Neck: No JVD present. No tracheal deviation present. No thyromegaly present.  Cardiovascular: Normal rate, regular rhythm, normal heart sounds and intact distal pulses.  Exam reveals no gallop and no friction rub.   No murmur heard. Pulmonary/Chest: No respiratory distress. He has no wheezes. He has no rales. He exhibits no tenderness.  Scattered coarse rhonchi R+L lungs  Abdominal: He exhibits no distension and no mass. There is no tenderness.  Genitourinary:  Genitourinary Comments: Foley  Musculoskeletal: Normal range of motion. He exhibits no edema or tenderness.  unstable on standiing  Lymphadenopathy:    He has no cervical adenopathy.  Neurological: He is alert. He has normal reflexes. No cranial nerve deficit. Coordination normal.  Intention tremor in both arms  Skin: No rash noted. No erythema. No pallor.  Left forearm skin tear, no s/s of infection.   Psychiatric: He has a normal mood and affect. He is slowed and withdrawn. He  expresses impulsivity and inappropriate judgment. He exhibits abnormal recent memory.    Labs reviewed:  Recent Labs  09/10/16 1325 09/10/16 1854 09/11/16 0030 11/09/16 11/15/16 11/19/16  NA 137 137 136 137 137 136*  K 4.0 4.2 3.9 3.9 5.0 4.3  CL 102 98* 102  --   --   --   CO2 26 28 22   --   --   --   GLUCOSE 92 119* 97  --   --   --   BUN 25* 26* 26* 30* 32* 29*  CREATININE 1.34* 1.20 1.23 1.1 1.4* 1.4*  CALCIUM 8.9 9.3 8.9  --   --   --     Recent Labs  09/10/16 1325 09/10/16 1854 11/09/16 11/19/16  AST 22 24 34 27  ALT 14* 16* 23 24  ALKPHOS 59 69 75 74  BILITOT 0.8 0.7  --   --   PROT 6.1* 7.2  --   --  ALBUMIN 3.6 4.1  --   --     Recent Labs  09/10/16 1325 09/10/16 1854 09/11/16 0030  WBC 12.2* 13.4* 12.2*  NEUTROABS 8.6* 9.4*  --   HGB 13.3 14.6 15.0  HCT 39.5 42.9 43.7  MCV 97.5 97.3 96.3  PLT 171 178 158   Lab Results  Component Value Date   TSH 1.12 11/11/2016   Lab Results  Component Value Date   HGBA1C 5.5 06/24/2013   Lab Results  Component Value Date   CHOL 132 11/22/2013   HDL 77 11/22/2013   LDLCALC 44 11/22/2013   TRIG 55 11/22/2013   CHOLHDL 1.7 11/22/2013    Significant Diagnostic Results in last 30 days:  No results found.  Assessment/Plan Urinary tract infection 01/10/17 urine culture S. Maltophilia >100,000c/ml, 7 day course of Septra DS started, tolerated.     Essential hypertension Controlled, continue Lisinopril and Metoprolol.  PSVT (paroxysmal supraventricular tachycardia) (HCC) Heart rate is in control, continue Metoprolol.   GERD (gastroesophageal reflux disease) Stable. Continue Famotidine.   Prostate cancer ([place]) Metastatic disease, Hospice service.  Urinary retention Continue Foley  Memory deficit Off Namenda since 12/27/16 Declining   Metastatic disease [facility]) Hospice service, comfort measures.   Adult failure to thrive Persists, continue comfort measures. Falls, X-ray 01/13/17 R femur, knee,  tibia, fibula; no acute fxs or dislocation.   Insomnia Sleeps better, continue Mirtazapine.    Family/ staff Communication: SNF Hospice Service.   Labs/tests ordered:  none

## 2017-01-14 NOTE — Assessment & Plan Note (Addendum)
Persists, continue comfort measures. Falls, X-ray 01/13/17 R femur, knee, tibia, fibula; no acute fxs or dislocation.

## 2017-01-14 NOTE — Assessment & Plan Note (Signed)
Hospice service, comfort measures.

## 2017-01-14 NOTE — Assessment & Plan Note (Signed)
Off Namenda since 12/27/16 Declining

## 2017-01-14 NOTE — Assessment & Plan Note (Signed)
Heart rate is in control, continue Metoprolol  

## 2017-01-14 NOTE — Assessment & Plan Note (Signed)
Sleeps better, continue Mirtazapine.  

## 2017-01-14 NOTE — Assessment & Plan Note (Signed)
Stable. Continue Famotidine.

## 2017-01-24 DEATH — deceased

## 2017-02-04 ENCOUNTER — Ambulatory Visit: Payer: Medicare Other | Admitting: Oncology

## 2018-01-07 IMAGING — NM NM BONE WHOLE BODY
2 series · 2 of 2 positions shown · non-contrast
Comparison: 11/25/2014.

CLINICAL DATA: Prostate cancer.

EXAM:
NUCLEAR MEDICINE WHOLE BODY BONE SCAN
TECHNIQUE: Whole body anterior and posterior images were obtained approximately
3 hours after intravenous injection of radiopharmaceutical.
RADIOPHARMACEUTICALS:  25.7 MCi 4echnetium-PPm MDP IV

[Series 1: whole body · 2.66mm/px · 1 of 1 slices shown (1 of 2)]
[im 1/1]
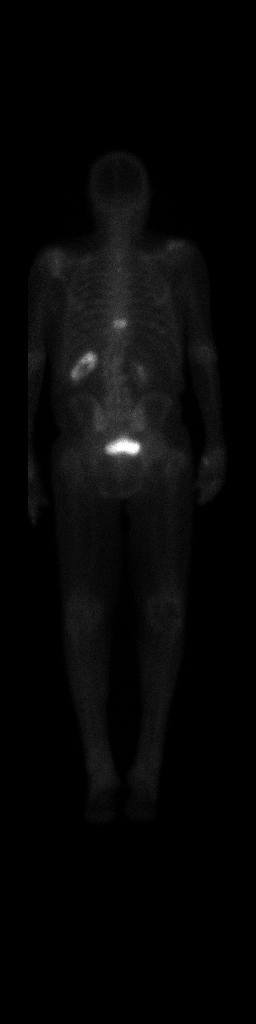

[Series 1: whole body · 2.66mm/px · 1 of 1 slices shown (2 of 2)]
[im 1/1]
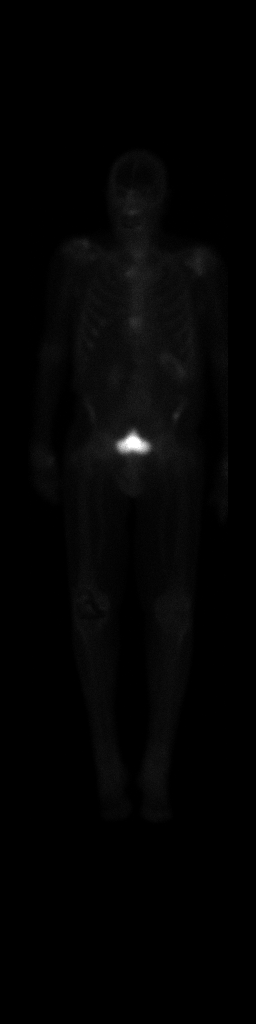

[2 of 2 positions shown; findings below may reference images not displayed]

FINDINGS: Intense activity noted over the left flank. This could be intense
activity in the left kidney possibly related to delayed excretion
from hydronephrosis. Renal ultrasound suggest for further
evaluation. Focal area of increased uptake noted in the region of
the mid to lower thoracic spine. MRI of the thoracic spinal with
gadolinium enhancement suggested to evaluate for metastatic disease.
Small focus of increased activity noted over the left iliac crest.
AP view of the pelvis suggested for further evaluation. Mild
activity increased activity noted over the shoulders and
sternoclavicular joint on the right, most likely degenerative. Mild
increased activity of maxilla most likely related dental disease
and/or sinus disease. Right knee replacement again noted.
IMPRESSION: 1. Intense activity noted the region of the left flank. This could
possibly represent delayed activity in the left kidney possibly from
hydronephrosis. Renal ultrasound suggest for further evaluation.

2. Intense activity noted over the mid to lower thoracic spine.
Gadolinium-enhanced MRI of the thoracic spine suggested to exclude
metastatic disease.

3. Punctate area of increased activity noted over the left iliac
crest. AP view of the pelvis suggested for further evaluation.
